# Patient Record
Sex: Male | Born: 1940 | Race: White | Hispanic: No | Marital: Married | Smoking: Never smoker
Health system: Southern US, Community
[De-identification: ages and names within clinical notes are randomized; demographics above are authoritative.]

## PROBLEM LIST (undated history)

## (undated) DIAGNOSIS — E785 Hyperlipidemia, unspecified: Secondary | ICD-10-CM

## (undated) DIAGNOSIS — N183 Chronic kidney disease, stage 3 unspecified: Secondary | ICD-10-CM

## (undated) DIAGNOSIS — L719 Rosacea, unspecified: Secondary | ICD-10-CM

## (undated) DIAGNOSIS — M1990 Unspecified osteoarthritis, unspecified site: Secondary | ICD-10-CM

## (undated) DIAGNOSIS — I10 Essential (primary) hypertension: Secondary | ICD-10-CM

## (undated) DIAGNOSIS — M5430 Sciatica, unspecified side: Secondary | ICD-10-CM

## (undated) DIAGNOSIS — Z87442 Personal history of urinary calculi: Secondary | ICD-10-CM

## (undated) DIAGNOSIS — I2510 Atherosclerotic heart disease of native coronary artery without angina pectoris: Secondary | ICD-10-CM

## (undated) DIAGNOSIS — R569 Unspecified convulsions: Secondary | ICD-10-CM

## (undated) DIAGNOSIS — R519 Headache, unspecified: Secondary | ICD-10-CM

## (undated) HISTORY — PX: CYSTOSCOPY: SHX5120

## (undated) HISTORY — DX: Hyperlipidemia, unspecified: E78.5

## (undated) HISTORY — PX: OTHER SURGICAL HISTORY: SHX169

## (undated) HISTORY — PX: MULTIPLE TOOTH EXTRACTIONS: SHX2053

## (undated) HISTORY — DX: Essential (primary) hypertension: I10

## (undated) HISTORY — DX: Unspecified convulsions: R56.9

## (undated) HISTORY — DX: Rosacea, unspecified: L71.9

---
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
## 1974-06-13 HISTORY — PX: RIB FRACTURE SURGERY: SHX2358

## 1998-12-23 ENCOUNTER — Ambulatory Visit (HOSPITAL_COMMUNITY): Admission: RE | Admit: 1998-12-23 | Discharge: 1998-12-23 | Payer: Self-pay | Admitting: Internal Medicine

## 1998-12-23 ENCOUNTER — Encounter: Payer: Self-pay | Admitting: Internal Medicine

## 1999-04-15 ENCOUNTER — Emergency Department (HOSPITAL_COMMUNITY): Admission: EM | Admit: 1999-04-15 | Discharge: 1999-04-15 | Payer: Self-pay | Admitting: Emergency Medicine

## 1999-11-18 ENCOUNTER — Encounter: Payer: Self-pay | Admitting: Emergency Medicine

## 1999-11-18 ENCOUNTER — Emergency Department (HOSPITAL_COMMUNITY): Admission: EM | Admit: 1999-11-18 | Discharge: 1999-11-19 | Payer: Self-pay | Admitting: Emergency Medicine

## 2000-03-18 ENCOUNTER — Encounter: Payer: Self-pay | Admitting: Emergency Medicine

## 2000-03-18 ENCOUNTER — Emergency Department (HOSPITAL_COMMUNITY): Admission: EM | Admit: 2000-03-18 | Discharge: 2000-03-18 | Payer: Self-pay | Admitting: Emergency Medicine

## 2002-01-18 ENCOUNTER — Emergency Department (HOSPITAL_COMMUNITY): Admission: EM | Admit: 2002-01-18 | Discharge: 2002-01-18 | Payer: Self-pay | Admitting: Emergency Medicine

## 2002-12-01 ENCOUNTER — Emergency Department (HOSPITAL_COMMUNITY): Admission: EM | Admit: 2002-12-01 | Discharge: 2002-12-01 | Payer: Self-pay | Admitting: Emergency Medicine

## 2002-12-01 ENCOUNTER — Encounter: Payer: Self-pay | Admitting: Emergency Medicine

## 2006-08-03 ENCOUNTER — Ambulatory Visit: Payer: Self-pay | Admitting: Internal Medicine

## 2006-09-21 ENCOUNTER — Ambulatory Visit: Payer: Self-pay | Admitting: Internal Medicine

## 2006-09-21 LAB — CONVERTED CEMR LAB
ALT: 49 units/L — ABNORMAL HIGH (ref 0–40)
AST: 33 units/L (ref 0–37)
BUN: 26 mg/dL — ABNORMAL HIGH (ref 6–23)
CO2: 34 meq/L — ABNORMAL HIGH (ref 19–32)
Calcium: 9.5 mg/dL (ref 8.4–10.5)
Chloride: 102 meq/L (ref 96–112)
Cholesterol: 179 mg/dL (ref 0–200)
Creatinine, Ser: 1.2 mg/dL (ref 0.4–1.5)
GFR calc Af Amer: 79 mL/min
GFR calc non Af Amer: 65 mL/min
Glucose, Bld: 171 mg/dL — ABNORMAL HIGH (ref 70–99)
HDL: 45 mg/dL (ref >39.0)
Hgb A1c MFr Bld: 7.1 % — ABNORMAL HIGH (ref 4.6–6.0)
LDL Cholesterol: 107 mg/dL — ABNORMAL HIGH (ref 0–99)
Potassium: 4 meq/L (ref 3.5–5.1)
Sodium: 143 meq/L (ref 135–145)
Total CHOL/HDL Ratio: 4
Triglycerides: 135 mg/dL (ref 0–149)
VLDL: 27 mg/dL (ref 0–40)

## 2006-09-28 ENCOUNTER — Ambulatory Visit: Payer: Self-pay | Admitting: Internal Medicine

## 2008-02-22 ENCOUNTER — Telehealth: Payer: Self-pay | Admitting: Internal Medicine

## 2008-06-20 ENCOUNTER — Ambulatory Visit: Payer: Self-pay | Admitting: Internal Medicine

## 2008-06-20 DIAGNOSIS — I10 Essential (primary) hypertension: Secondary | ICD-10-CM | POA: Insufficient documentation

## 2008-07-16 ENCOUNTER — Ambulatory Visit: Payer: Self-pay | Admitting: Gastroenterology

## 2008-07-16 ENCOUNTER — Telehealth: Payer: Self-pay | Admitting: Gastroenterology

## 2008-07-16 DIAGNOSIS — K625 Hemorrhage of anus and rectum: Secondary | ICD-10-CM | POA: Insufficient documentation

## 2008-07-16 DIAGNOSIS — R1310 Dysphagia, unspecified: Secondary | ICD-10-CM | POA: Insufficient documentation

## 2008-07-16 DIAGNOSIS — R197 Diarrhea, unspecified: Secondary | ICD-10-CM | POA: Insufficient documentation

## 2008-07-16 DIAGNOSIS — R1013 Epigastric pain: Secondary | ICD-10-CM | POA: Insufficient documentation

## 2008-07-25 ENCOUNTER — Encounter: Payer: Self-pay | Admitting: Internal Medicine

## 2008-07-25 ENCOUNTER — Ambulatory Visit: Payer: Self-pay | Admitting: Gastroenterology

## 2008-07-25 ENCOUNTER — Encounter: Payer: Self-pay | Admitting: Gastroenterology

## 2008-07-28 ENCOUNTER — Telehealth: Payer: Self-pay | Admitting: Gastroenterology

## 2008-07-29 ENCOUNTER — Telehealth: Payer: Self-pay | Admitting: Gastroenterology

## 2008-07-29 ENCOUNTER — Encounter: Payer: Self-pay | Admitting: Gastroenterology

## 2008-07-31 ENCOUNTER — Ambulatory Visit: Payer: Self-pay | Admitting: Gastroenterology

## 2008-07-31 LAB — CONVERTED CEMR LAB
ALT: 46 units/L (ref 0–53)
AST: 39 units/L — ABNORMAL HIGH (ref 0–37)
Albumin: 4 g/dL (ref 3.5–5.2)
Alkaline Phosphatase: 79 units/L (ref 39–117)
BUN: 31 mg/dL — ABNORMAL HIGH (ref 6–23)
Basophils Absolute: 0 10*3/uL (ref 0.0–0.1)
Basophils Relative: 0.3 % (ref 0.0–3.0)
CO2: 29 meq/L (ref 19–32)
Calcium: 9.2 mg/dL (ref 8.4–10.5)
Chloride: 105 meq/L (ref 96–112)
Creatinine, Ser: 1.3 mg/dL (ref 0.4–1.5)
Eosinophils Absolute: 0.1 10*3/uL (ref 0.0–0.7)
Eosinophils Relative: 1.5 % (ref 0.0–5.0)
GFR calc Af Amer: 71 mL/min
GFR calc non Af Amer: 59 mL/min
Glucose, Bld: 197 mg/dL — ABNORMAL HIGH (ref 70–99)
HCT: 40.8 % (ref 39.0–52.0)
Hemoglobin: 14.7 g/dL (ref 13.0–17.0)
Lymphocytes Relative: 37.4 % (ref 12.0–46.0)
MCHC: 36 g/dL (ref 30.0–36.0)
MCV: 91.5 fL (ref 78.0–100.0)
Monocytes Absolute: 0.4 10*3/uL (ref 0.1–1.0)
Monocytes Relative: 8 % (ref 3.0–12.0)
Neutro Abs: 3 10*3/uL (ref 1.4–7.7)
Neutrophils Relative %: 52.8 % (ref 43.0–77.0)
Platelets: 140 10*3/uL — ABNORMAL LOW (ref 150–400)
Potassium: 4.3 meq/L (ref 3.5–5.1)
RBC: 4.46 M/uL (ref 4.22–5.81)
RDW: 12.1 % (ref 11.5–14.6)
Sodium: 142 meq/L (ref 135–145)
Total Bilirubin: 1.1 mg/dL (ref 0.3–1.2)
Total Protein: 6.9 g/dL (ref 6.0–8.3)
WBC: 5.6 10*3/uL (ref 4.5–10.5)

## 2008-08-01 ENCOUNTER — Ambulatory Visit: Payer: Self-pay | Admitting: Gastroenterology

## 2008-08-01 ENCOUNTER — Encounter: Payer: Self-pay | Admitting: Gastroenterology

## 2008-08-04 ENCOUNTER — Encounter: Payer: Self-pay | Admitting: Gastroenterology

## 2008-08-22 ENCOUNTER — Ambulatory Visit: Payer: Self-pay | Admitting: Internal Medicine

## 2008-08-22 DIAGNOSIS — E1169 Type 2 diabetes mellitus with other specified complication: Secondary | ICD-10-CM | POA: Insufficient documentation

## 2008-08-22 DIAGNOSIS — E118 Type 2 diabetes mellitus with unspecified complications: Secondary | ICD-10-CM | POA: Insufficient documentation

## 2008-08-22 DIAGNOSIS — E119 Type 2 diabetes mellitus without complications: Secondary | ICD-10-CM | POA: Insufficient documentation

## 2008-08-22 DIAGNOSIS — E785 Hyperlipidemia, unspecified: Secondary | ICD-10-CM | POA: Insufficient documentation

## 2008-08-22 LAB — CONVERTED CEMR LAB
BUN: 29 mg/dL — ABNORMAL HIGH (ref 6–23)
Basophils Absolute: 0 10*3/uL (ref 0.0–0.1)
Basophils Relative: 0.1 % (ref 0.0–3.0)
CO2: 32 meq/L (ref 19–32)
Calcium: 9.8 mg/dL (ref 8.4–10.5)
Chloride: 102 meq/L (ref 96–112)
Cholesterol: 207 mg/dL (ref 0–200)
Creatinine, Ser: 1.3 mg/dL (ref 0.4–1.5)
Direct LDL: 126 mg/dL
Eosinophils Absolute: 0.1 10*3/uL (ref 0.0–0.7)
Eosinophils Relative: 1.5 % (ref 0.0–5.0)
GFR calc Af Amer: 71 mL/min
GFR calc non Af Amer: 59 mL/min
Glucose, Bld: 79 mg/dL (ref 70–99)
HCT: 44.6 % (ref 39.0–52.0)
HDL: 51.2 mg/dL (ref >39.0)
Hemoglobin: 15.5 g/dL (ref 13.0–17.0)
Hgb A1c MFr Bld: 7.2 % — ABNORMAL HIGH (ref 4.6–6.0)
Lymphocytes Relative: 35.9 % (ref 12.0–46.0)
MCHC: 34.8 g/dL (ref 30.0–36.0)
MCV: 91.7 fL (ref 78.0–100.0)
Monocytes Absolute: 0.6 10*3/uL (ref 0.1–1.0)
Monocytes Relative: 8.1 % (ref 3.0–12.0)
Neutro Abs: 4 10*3/uL (ref 1.4–7.7)
Neutrophils Relative %: 54.4 % (ref 43.0–77.0)
PSA: 1.14 ng/mL (ref 0.10–4.00)
Platelets: 147 10*3/uL — ABNORMAL LOW (ref 150–400)
Potassium: 4.3 meq/L (ref 3.5–5.1)
RBC: 4.86 M/uL (ref 4.22–5.81)
RDW: 11.8 % (ref 11.5–14.6)
Sodium: 144 meq/L (ref 135–145)
Total CHOL/HDL Ratio: 4
Triglycerides: 227 mg/dL (ref 0–149)
VLDL: 45 mg/dL — ABNORMAL HIGH (ref 0–40)
WBC: 7.3 10*3/uL (ref 4.5–10.5)

## 2008-08-23 ENCOUNTER — Encounter: Payer: Self-pay | Admitting: Internal Medicine

## 2008-08-25 ENCOUNTER — Telehealth: Payer: Self-pay | Admitting: Gastroenterology

## 2009-09-08 ENCOUNTER — Telehealth (INDEPENDENT_AMBULATORY_CARE_PROVIDER_SITE_OTHER): Payer: Self-pay | Admitting: *Deleted

## 2010-07-13 NOTE — Progress Notes (Signed)
Summary: Call back for Labs  Phone Note Outgoing Call Call back at [facility] Phone [phone]   Call placed by: Merri Ray CMA,  July 16, 2008 3:53 PM Summary of Call: called pt to inform him to come back to our lab for labwork. The orders are in IDX  left message for pt to call back if he had any questions Initial call taken by: Merri Ray CMA,  July 16, 2008 3:53 PM

## 2010-07-13 NOTE — Miscellaneous (Signed)
  Clinical Lists Changes  Medications: Added new medication of NEXIUM 40 MG  CPDR (ESOMEPRAZOLE MAGNESIUM) 1 capsule each day 30 minutes before meal - Signed Rx of NEXIUM 40 MG  CPDR (ESOMEPRAZOLE MAGNESIUM) 1 capsule each day 30 minutes before meal;  #30 x 3;  Signed;  Entered by: Louis Meckel MD;  Authorized by: Louis Meckel MD;  Method used: [id number] [address] [place], [place], [place]  [address], Ph: [phone] or [phone], Fax: [phone]    Prescriptions: NEXIUM 40 MG  CPDR (ESOMEPRAZOLE MAGNESIUM) 1 capsule each day 30 minutes before meal  #30 x 3   Entered and Authorized by:   Louis Meckel MD   Signed by:   Louis Meckel MD on 07/25/2008   Method used:   [facility]        [facility]  [address] [phone][facility] (retail)       [address]       [place], [place]  [address]       Ph: [phone] or [phone]       Fax: [phone]   RxID:   [id number]

## 2010-07-13 NOTE — Progress Notes (Signed)
Summary: RX  Phone Note Call from Patient Call back at [phone]   Summary of Call: Patient is requesting rx for "male enhancement things". He threw away the bottle and does not have info about rx. Pt last seen over a year ago. He is diabetic and has htn, which he says is why he says he needs the med. When is pt due for f/u office visit? 08/23/3008 you advised f/u labs in 6 mths. Ok to fill med? Please give details, thanks Initial call taken by: Lamar Sprinkles, CMA,  September 08, 2009 12:10 PM  Follow-up for Phone Call        needs lab: A1C 250.02, lipid panel 250.02. metabolic 401.9, hepatic 995.20,  needs ov no record of meds for erectile dysfunction. May have a trial of viagra 50mg  as needed, #6, refill as needed.  Follow-up by: Jacques Navy MD,  September 08, 2009 12:53 PM  Additional Follow-up for Phone Call Additional follow up Details #1::        left mess to call office back..........................Marland KitchenLamar Sprinkles, CMA  September 08, 2009 3:39 PM     Additional Follow-up for Phone Call Additional follow up Details #2::    Pt informed, please put lab order in IDX. THANK YOU Follow-up by: Lamar Sprinkles, CMA,  September 08, 2009 5:35 PM  Additional Follow-up for Phone Call Additional follow up Details #3:: Details for Additional Follow-up Action Taken: Orders in IDX  Additional Follow-up by: Verdell Face,  September 11, 2009 3:25 PM  New/Updated Medications: VIAGRA 50 MG TABS (SILDENAFIL CITRATE) 1 as needed Prescriptions: VIAGRA 50 MG TABS (SILDENAFIL CITRATE) 1 as needed  #6 x 12   Entered by:   Lamar Sprinkles, CMA   Authorized by:   Jacques Navy MD   Signed by:   Lamar Sprinkles, CMA on 09/08/2009   Method used:   [facility]        [facility]  [address] [phone][facility] (retail)       [address]       [place], [place]  [address]       Ph: [phone]       Fax: [phone]   RxID:   [id number]

## 2010-07-13 NOTE — Progress Notes (Signed)
Summary: Refill  Phone Note Refill Request Message from:  Fax from Pharmacy on February 22, 2008 2:01 PM  Refills Requested: Medication #1:  GLIMEPIRIDE 4 MG  TABS Take 1/2 tablet by mouth once a day Summary of Call: Received fax request for "Need Sig Correction" "Pt states he takes 1/2 bid is that correct?" Initial call taken by: Zackery Barefoot CMA,  February 22, 2008 2:03 PM  Follow-up for Phone Call        Rx sent electronic Follow-up by: Zackery Barefoot CMA,  February 22, 2008 2:08 PM    New/Updated Medications: GLIMEPIRIDE 4 MG  TABS (GLIMEPIRIDE) Take 1/2 tablet by mouth two times a day   Prescriptions: GLIMEPIRIDE 4 MG  TABS (GLIMEPIRIDE) Take 1/2 tablet by mouth two times a day  #60 x 3   Entered by:   Zackery Barefoot CMA   Authorized by:   Jacques Navy MD   Signed by:   Zackery Barefoot CMA on 02/22/2008   Method used:   Electronically to        [facility]* (retail)       [address]       [place], [place]  [address]       Ph: [phone]       Fax: [phone]   RxID:   [id number]

## 2010-07-13 NOTE — Assessment & Plan Note (Signed)
Summary: CONSTANT ABD PAIN/YF   History of Present Illness Visit Type: new patient Primary GI MD: Melvia Heaps MD [facility] Primary Provider: [name] MD Chief Complaint: epigastric pain History of Present Illness:   Scott Wilkins is a pleasant 70 year old white male patient of Dr. Debby Bud here for evaluation of abdominal pain and diarrhea.  For the past 2 years he has been complaining of postprandial upper abdominal pain.  Pain is without radiation.  He may have dysphagia to solids with associated odynophagia.  He does complain of pyrosis.  Weight has been stable.  He is on no gastric irritants including nonsteroidals.  He also has been complaining of diarrhea.  Typically he has 3-4 loose bowel movements daily.  He will occasionally awaken at night to move his bowels.  At times he has had limited bright red blood per rectum.    GI Review of Systems    Reports abdominal pain, acid reflux, belching, bloating, and  heartburn.     Location of  Abdominal pain: epigastric area.    Denies chest pain, dysphagia with liquids, dysphagia with solids, loss of appetite, nausea, vomiting, vomiting blood, weight loss, and  weight gain.      Reports diarrhea, hemorrhoids, and  rectal bleeding.     Denies anal fissure, black tarry stools, change in bowel habit, constipation, diverticulosis, fecal incontinence, heme positive stool, irritable bowel syndrome, jaundice, light color stool, liver problems, and  rectal pain.   Prior Medications Reviewed Using: Medication Bottles  Updated Prior Medication List: GLIMEPIRIDE 4 MG  TABS (GLIMEPIRIDE) Take 1/2 tablet by mouth two times a day LISINOPRIL-HYDROCHLOROTHIAZIDE 10-12.5 MG TABS (LISINOPRIL-HYDROCHLOROTHIAZIDE) 1 by mouth once daily METFORMIN HCL 1000 MG  TABS (METFORMIN HCL) Take 1 tablet by mouth two times a day for diabetes  Current Allergies (reviewed today): No known allergies  Past Medical History:    Depression    Hypertension    ENLARGED PROSTATE   Past Surgical History:    head surgery at age 63     kidney stone removal   Family History:    lung cancer :  father    Family History of Breast Cancer:sister    Family History of Heart Disease: mother father  Social History:    Patient has never smoked.     Alcohol Use - no    Illicit Drug Use - no    Patient does not get regular exercise.   Risk Factors:  Tobacco use:  never Drug use:  no Alcohol use:  no Exercise:  no  Review of Systems       The patient complains of cough, fatigue, and headaches-new.         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  He clearly has GERD and could also have active peptic disease.  Recommendations #1 begin Nexium 40 mg a day #2 upper endoscopy Orders: TLB-CBC Platelet - w/Differential (85025-CBCD) TLB-CMP (Comprehensive Metabolic Pnl) (80053-COMP)   Problem # 2:  DYSPHAGIA UNSPECIFIED (ICD-787.20) Plan endoscopy with dilatation as indicated   Problem # 3:  DIARRHEA (ICD-787.91) Diarrhea but may be related to IBS.  A structurally; including colitis are other considerations.  It is noteworthy that he has had problems for over 20 years.  Recommendations #1 colonoscopy.  If negative I will obtain random biopsies to rule out microscopic colitis  Problem # 4:  HEMORRHAGE OF RECTUM AND ANUS (ICD-569.3) rule out colonic bleeding sources including polyps hemorrhoids and neoplasm plan recommendations #1 colonoscopy   Patient Instructions: 1)  Colonoscopy and Flexible Sigmoidoscopy brochure given. 2)  Conscious Sedation brochure given. 3)  Upper Endoscopy with Dilatation brochure given.  Appended Document: Orders Update    Clinical Lists Changes  Medications: Added new medication of MIRALAX   POWD (POLYETHYLENE GLYCOL 3350) As per prep  instructions. - Signed Added new medication of REGLAN 10 MG  TABS (METOCLOPRAMIDE HCL) As per prep instructions. - Signed Added new medication of DULCOLAX 5 MG  TBEC (BISACODYL) Day before procedure take 2 at 3pm and 2 at 8pm. - Signed Rx of MIRALAX   POWD (POLYETHYLENE GLYCOL 3350) As per prep  instructions.;  #255gm x 0;  Signed;  Entered by: Merri Ray CMA;  Authorized by: Louis Meckel MD;  Method used: Print then Give to Patient Rx of REGLAN 10 MG  TABS (METOCLOPRAMIDE HCL) As per prep instructions.;  #2 x 0;  Signed;  Entered by: Merri Ray CMA;  Authorized by: Louis Meckel MD;  Method used: Print then Give to Patient Rx of DULCOLAX 5 MG  TBEC (BISACODYL) Day before procedure take 2 at 3pm and 2 at 8pm.;  #4 x 0;  Signed;  Entered by: Merri Ray CMA;  Authorized by: Louis Meckel MD;  Method used: Print then Give to Patient Orders: Added new Test order of EGD SAV (EGD SAV) - Signed Added new Test order of Colonoscopy (Colon) - Signed    Prescriptions: DULCOLAX 5 MG  TBEC (BISACODYL) Day before procedure take 2 at 3pm and 2 at 8pm.  #4 x 0   Entered by:   Merri Ray CMA   Authorized by:   Louis Meckel MD   Signed by:   Merri Ray CMA on 07/16/2008   Method used:   Print then Give to Patient   RxID:   [id number] REGLAN 10 MG  TABS (METOCLOPRAMIDE HCL) As per prep instructions.  #2 x 0   Entered by:   Merri Ray CMA   Authorized by:   Louis Meckel MD   Signed by:   Merri Ray CMA on 07/16/2008   Method used:   Print then Give to Patient   RxID:   [id number] MIRALAX   POWD (POLYETHYLENE GLYCOL 3350) As per prep  instructions.  #255gm x 0   Entered by:   Merri Ray CMA   Authorized by:   Louis Meckel MD  Signed by:   Merri Ray CMA on 07/16/2008   Method used:   Print then Give to Patient   RxID:   [id number]    Appended Document: CONSTANT ABD PAIN/YF    Clinical Lists Changes  Medications: Added new medication of MIRALAX   POWD (POLYETHYLENE GLYCOL 3350) As per prep  instructions. - Signed Added new medication of REGLAN 10 MG  TABS (METOCLOPRAMIDE HCL) As per prep instructions. - Signed Added new medication of DULCOLAX 5 MG  TBEC (BISACODYL) Day before procedure take 2 at 3pm and 2 at 8pm. - Signed Rx of MIRALAX   POWD (POLYETHYLENE GLYCOL 3350) As per prep  instructions.;  #255gm x 0;  Signed;  Entered by: Merri Ray CMA;  Authorized by: Louis Meckel MD;  Method used: [facility] to [id number] [address], [place], [place], [place]  [address], Ph: [phone] or [phone], Fax: [phone] Rx of REGLAN 10 MG  TABS (METOCLOPRAMIDE HCL) As per prep instructions.;  #2 x 0;  Signed;  Entered by: Merri Ray CMA;  Authorized by: Louis Meckel MD;  Method used: Electronically to [id number]*, [address] [place], [place], [place]  [address], Ph: [phone] or [phone], Fax: [phone] Rx of DULCOLAX 5 MG  TBEC (BISACODYL) Day before procedure take 2 at 3pm and 2 at 8pm.;  #4 x 0;  Signed;  Entered by: Merri Ray CMA;  Authorized by: Louis Meckel MD;  Method used: Electronically to [id number]*, [address] [place], [place], [place]  [address], Ph: [phone] or [phone], Fax: [phone]    Prescriptions: DULCOLAX 5 MG  TBEC (BISACODYL) Day before procedure take 2 at 3pm and 2 at 8pm.  #4 x 0   Entered by:   Merri Ray CMA   Authorized by:   Louis Meckel MD   Signed by:   Merri Ray CMA on 07/16/2008   Method used:   [facility]ron[facility] to        [facility]  [address] [phone]* (retail)       [address]       [place], [place]  [address]       Ph: [phone] or [phone]       Fax: [phone]   RxID:   [id number] REGLAN 10 MG  TABS (METOCLOPRAMIDE HCL) As per prep instructions.  #2 x 0   Entered by:   Merri Ray CMA   Authorized by:   Louis Meckel MD   Signed by:   Merri Ray CMA on 07/16/2008   Method used:   Electronically to        [facility]  [address] [phone]* (retail)       [address]       [place], [place]  [address]       Ph: [phone] or [phone]       Fax: [phone]   RxID:   [id number] MIRALAX   POWD (POLYETHYLENE GLYCOL 3350) As per prep  instructions.  #255gm x 0   Entered by:   Merri Ray CMA   Authorized by:   Louis Meckel MD   Signed by:   Merri Ray CMA on 07/16/2008   Method used:   [facility]ronically to        [facility]  [address] [phone]* (  retail)       [address]       [place], [place]  [address]       Ph: [phone] or [phone]       Fax: [phone]   RxID:   [id number]

## 2010-07-13 NOTE — Letter (Signed)
[facility] [address] [place], [place]  [address] Phone: [phone]      August 23, 2008   [place] [address] [place], [place] [address]  RE:  LAB RESULTS  Dear  Scott Wilkins,  The following is an interpretation of your most recent lab tests.  Please take note of any instructions provided or changes to medications that have resulted from your lab work.  ELECTROLYTES:  Good - no changes needed  KIDNEY FUNCTION TESTS:  Good - no changes needed  LIVER FUNCTION TESTS:  Good - no changes needed  Health professionals look at cholesterol as more involved than just the total cholesterol. We consider the level of LDL (bad) cholesterol, HDL (good), cholesterol, and Triglycerides (Grease) in the blood.  1. Your LDL should be under 100, and the HDL should be over 45, if you have any vascular disease such as heart attack, angina, stroke, TIA (mini stroke), claudication (pain in the legs when you walk due to poor circulation),  Abdominal Aortic Aneurysm (AAA), diabetes or prediabetes.  2. Your LDL should be under 130 if you have any two of the following:     a. Smoke or chew tobacco,     b. High blood pressure (if you are on medication or over 140/90 without medication),     c. Male gender,    d. HDL below 40,    e. A male relative (father, brother, or son), who have had any vascular event          as described in #1. above under the age of 32, or a male relative (mother,       sister, or daughter) who had an event as described above under age 23. (An HDL over 60 will subtract one risk factor from the total, so if you have two items in # 2 above, but an HDL over 60, you then fall into category # 3 below).  3. Your LDL should be under 160 if you have any one of the above.  Triglycerides should be under 200 with the ideal being under 150.  For diabetes or pre-diabetes, the ideal HgbA1C should be under 6.0%.  If you fall into any of the above  categories, you should make a follow up appointment to discuss this with your physician.  LIPID PANEL:  Fair - review at your next visit Triglyceride: 227   Cholesterol: 207   LDL: DEL   HDL: 51.2   Chol/HDL%:  4.0 CALC  DIABETIC STUDIES:  Improved - continue management Blood Glucose: 79   HgbA1C: 7.2     CBC:  Good - no changes needed  LDL 126 is above goal for a diabetic  of 100 or less.   Lab results look pretty good: A1C is very close to goal. The LDL is too high and because of the many benefits for diabetics including lowering the LDL I recommend you start lovastatin once a day. A Rx has been sent to your pharmacy. You will need follow-up lab in 6 weeks.  Call or e-mail me if you have questions ([email or web address]).   Medications Prescribed or Changed LOVASTATIN 20 MG TABS (LOVASTATIN) 1 by mouth qPM   Sincerely Yours,    Jacques Navy MD Patient: Scott Wilkins Note: All result statuses are Final unless otherwise noted.  Tests: (1) LIPID PROFILE (LIPID)   CHOLESTEROL          [HH] 207 mg/dL  0-200     REFLEX TESTING FOR LDLD       ATP III Classification:             < 200       mg/dL      Desireable            200 - 239    mg/dL      Borderline High            > = 240      mg/dL      High   TRIGLYCERIDES        [HH] 227 mg/dL                   1-610        Normal:  < 150 mg/dL        Borderline High:  150 - 199 mg/dL   HDL                       96.0 mg/dL                  >45.4   VLDL CHOLESTEROL     [H]  45 mg/dl                    0-98   LDL CHOLESTEROL           DEL (D)  CHOL/HDL Ratio: CHD Risk                             4.0 CALC  Tests: (2) CBC WITH DIFF (CBCD)   WHITE CELL COUNT          7.3 K/uL                    4.5-10.5   RED CELL COUNT            4.86 Mil/uL                 4.22-5.81   HEMOGLOBIN                15.5 g/dL                   11.9-14.7   HEMATOCRIT                44.6 %                      39.0-52.0   MCV                        91.7 fl                     78.0-100.0   MCHC                      34.8 g/dL                   82.9-56.2   RDW                       11.8 %                      11.5-14.6   PLATELET COUNT       [L]  147 K/uL  150-400   NEUTROPHIL %              54.4 %                      43.0-77.0   LYMPHOCYTE %              35.9 %                      12.0-46.0   MONOCYTE %                8.1 %                       3.0-12.0   EOSINOPHILS %             1.5 %                       0.0-5.0   BASOPHILS %               0.1 %                       0.0-3.0  NEUTROPHILS, ABSOLUTE                             4.0 K/uL                    1.4-7.7   MONOCYTES, ABSOLUTE       0.6 K/uL                    0.1-1.0  EOSINOPHILS, ABSOLUTE                             0.1 K/uL                    0.0-0.7   BASOPHILS, ABSOLUTE       0.0 K/uL                    0.0-0.1  Tests: (3) BASIC METABOLIC PANEL (METABOL)   SODIUM                    144 mEq/L                   135-145   POTASSIUM                 4.3 mEq/L                   3.5-5.1   CHLORIDE                  102 mEq/L                   96-112   CARBON DIOXIDE            32 mEq/L                    19-32   GLUCOSE                   79 mg/dL                    27-03   BUN                  [  H]  29 mg/dL                    4-03   CREATININE                1.3 mg/dL                   4.7-4.2   CALCIUM                   9.8 mg/dL                   5.9-56.3  GFR (AFRICAN AMERICAN)                             71 mL/min  GFR (NON-AFRICAN AMERICAN)                             59 mL/min  Tests: (4) HGB A1C (A1C)   A1C%                 [H]  7.2 %                       4.6-6.0     The ADA recommends the following therapeutic goals for glycemic     control related to Hgb A1C measurement.          Goal of Therapy   < 7.0% Hgb A1C     Action Suggested  > 8.0% Hgb A1C          Ref: Diabetes Care, 22, Suppl. 1, 1999  Tests: (5)  CHOLESTEROL, LDL-DIRECT (DIRLDL)  CHOLESTEROL, LDL-DIRECT                             126.0 mg/dL       ATP III Classification:             < 100      mg/dL     Optimal             100 - 129  mg/dL     Near or above optimal             130 - 159  mg/dL     Borderline High             160 - 189  mg/dL     High              > 190     mg/dL     Very High  Tests: (6) PSA_Hyb (PSA)   PSA_Hyb                   1.14 ng/mL                  0.10-4.00

## 2010-07-13 NOTE — Procedures (Signed)
Summary: EGD   EGD  Procedure date:  07/25/2008  Findings:      Location: [facility]    ENDOSCOPY PROCEDURE REPORT  PATIENT:  Scott Wilkins, Scott Wilkins  MR#:  [id number] BIRTHDATE:   Jun 24, 1940   GENDER:   male  ENDOSCOPIST:   Scott Hair. Arlyce Dice, MD ASSISTANT:    PROCEDURE DATE:  07/25/2008 PROCEDURE:  EGD with biopsy, Elease Hashimoto Dilation of the Esophagus ASA CLASS:   Class II INDICATIONS: 1) dysphagia  2) dyspepsia   MEDICATIONS:    Fentanyl 50 mcg, Versed 6 mg, glycopyrrolate (Robinal) 0.2, 0.6cc simethancone 0.6 cc PO TOPICAL ANESTHETIC:   Exactacain Spray  DESCRIPTION OF PROCEDURE:   After the risks benefits and alternatives of the procedure were thoroughly explained, informed consent was obtained.  [facility] [id number] endoscope was introduced through the mouth and advanced to the third portion of the duodenum, without limitations.  The instrument was slowly withdrawn as the mucosa was carefully examined. <<PROCEDUREIMAGES>>  <<OLD IMAGES>>  other findings (see image3). Prominent papilla  A stricture was found at the gastroesophageal junction (see image4).  The examination was otherwise normal.    Dilation was then performed at the gastroesphageal junction  1) Dilator:   Maloney   Size(s):   18 Resistance:   minimal   Heme:   none Appearance:     COMPLICATIONS:   None  ENDOSCOPIC IMPRESSION:  1) Other findings - prominent papilla  2) Stricture at the gastroesophageal junction  3) Otherwise normal examination. RECOMMENDATIONS:  1) continue PPI  2) follow-up: office 4 week(s)  REPEAT EXAM:   No  cc Dr. [name]   _______________________________ Scott Hair. Arlyce Dice, MD         [facility] [address] [address] [place], [place] [address] Telephone [phone] or [phone] Fax [phone]   REPORT OF SURGICAL PATHOLOGY   Case #: [id number] Patient Name: Scott Wilkins. Office Chart Number:  N/A   MRN: [id number]  Pathologist: Ferd Hibbs. Colonel Bald, MD DOB/Age  [birth year] (Age: 70)    Gender: M Date Taken:  07/25/2008 Date Received: 07/25/2008   FINAL DIAGNOSIS   ***MICROSCOPIC EXAMINATION AND DIAGNOSIS***   DUODENUM, BIOPSY:   - CHRONIC DUODENITIS CONSISTENT WITH PEPTIC DUODENITIS.   - NO VILLOUS ATROPHY OR MALIGNANCY IDENTIFIED.   mj Date Reported:  07/28/2008     Ivin Booty B. Colonel Bald, MD *** Electronically Signed Out By JBK ***   Clinical information Epigastric pain, rule out dysplasia (mj)   specimen(s) obtained Duodenum, biopsy   Gross Description Received in formalin are tan, soft tissue fragments that are submitted in toto.  Number:  Two            Size:  Both 0.3 cm (TB:mj 07/25/08)   mj/     Signed by Scott Meckel MD on 07/29/2008 at 9:04 AM   July 29, 2008 MRN: [id number]    [name] [address] [place], [place]  [address]    Dear [name],  I am pleased to inform you that the biopsies taken during your recent endoscopic examination did not show any evidence of cancer upon pathologic examination.  Additional information/recommendations:  __No further action is needed at this time.  Please follow-up with      your primary care physician for your other healthcare needs.  __ Please call [phone] to schedule a return visit to review      your condition.  _x_ Continue with the treatment plan as outlined on  the day of your      exam.  __ You should have a repeat endoscopic examination for this problem              in _ months/years.   Please call us if you are having persistent problems or have questions about your condition that have not been fully answered at this time.  Sincerely,  Scott Meckel MD  This letter has been electronically signed by your physician.   S igned by Scott Meckel MD on 07/29/2008 at 9:05 AM  This report was created from the original endoscopy report, which was reviewed and signed by the above listed endoscopist.

## 2010-07-13 NOTE — Letter (Signed)
Summary: Patient [facility] Biopsy Results  [facility]  [address] [place], [place] [address]   Phone: [phone]  Fax: [phone]        July 29, 2008 MRN: [id number]    Scott Wilkins [address] [place], [place]  [address]    Dear Scott Wilkins,  I am pleased to inform you that the biopsies taken during your recent endoscopic examination did not show any evidence of cancer upon pathologic examination.  Additional information/recommendations:  __No further action is needed at this time.  Please follow-up with      your primary care physician for your other healthcare needs.  __ Please call [phone] to schedule a return visit to review      your condition.  _x_ Continue with the treatment plan as outlined on the day of your      exam.  __ You should have a repeat endoscopic examination for this problem              in _ months/years.   Please call us if you are having persistent problems or have questions about your condition that have not been fully answered at this time.  Sincerely,  Louis Meckel MD  This letter has been electronically signed by your physician.

## 2010-07-13 NOTE — Letter (Signed)
Summary: Patient Notice-Hyperplastic Polyps  [facility]  [address] [place], [place] [address]   Phone: [phone]  Fax: [phone]        August 04, 2008 MRN: [id number]    [place] [address] [place], [place]  [address]    Dear Scott Wilkins,  I am pleased to inform you that the colon polyp(s) removed during your recent colonoscopy was (were) found to be hyperplastic.  These types of polyps are NOT pre-cancerous.  It is therefore my recommendation that you have a repeat colonoscopy examination in 10_ years for routine colorectal cancer screening.  Should you develop new or worsening symptoms of abdominal pain, bowel habit changes or bleeding from the rectum or bowels, please schedule an evaluation with either your primary care physician or with me.  Additional information/recommendations:  __No further action with gastroenterology is needed at this time.      Please follow-up with your primary care physician for your other      healthcare needs. __Please call [phone] to schedule a return visit to review      your situation.  __Please keep your follow-up visit as already scheduled.  _x_Continue treatment plan as outlined the day of your exam.  Please call us if you are having persistent problems or have questions about your condition that have not been fully answered at this time.  Sincerely,  Scott Meckel MD This letter has been electronically signed by your physician.

## 2010-07-13 NOTE — Procedures (Signed)
Summary: Colonoscopy   Colonoscopy  Procedure date:  08/01/2008  Findings:      Location:  [facility].    Procedures Next Due Date:    Colonoscopy: 07/2018  COLONOSCOPY PROCEDURE REPORT  PATIENT:  Scott Wilkins, Scott Wilkins  MR#:  [id number] BIRTHDATE:   06-Mar-1941   GENDER:   male  ENDOSCOPIST:   Barbette Hair. Arlyce Dice, MD Referred by: Rosalyn Gess. Norins, M.D.  PROCEDURE DATE:  08/01/2008 PROCEDURE:  Colonoscopy with biopsy, Colonoscopy with snare polypectomy ASA CLASS:   Class II INDICATIONS: unexplained diarrhea   MEDICATIONS:    Fentanyl 50 mcg, Versed 7 mg  DESCRIPTION OF PROCEDURE:   After the risks benefits and alternatives of the procedure were thoroughly explained, informed consent was obtained.  Digital rectal exam was performed and revealed no abnormalities.   The LB [id number] endoscope was introduced through the anus and advanced to the cecum, which was identified by both the appendix and ileocecal valve, without limitations.  The quality of the prep was adequate, using MiraLax.  The instrument was then slowly withdrawn as the colon was fully examined. <<PROCEDUREIMAGES>>                  <<OLD IMAGES>>  FINDINGS:  A sessile polyp was found. It was 1 cm in size. Polyp was snared, then cauterized with monopolar cautery. Retrieval was successful (see image2). snare polyp  This was otherwise a normal examination (see image4, image5, image6, image7, image8, image9, image10, image11, and image12). Random biopsies were taken to r/o microscopic colitis   Retroflexed views in the rectum revealed no abnormalities.    The scope was then withdrawn from the patient and the procedure completed.  COMPLICATIONS:   None  ENDOSCOPIC IMPRESSION:  1) 1 cm sessile polyp  2) Otherwise normal examination RECOMMENDATIONS:  1) If the polyp(s) removed today are proven to be adenomatous (pre-cancerous) polyps, you will need a repeat colonoscopy in 5 years. Otherwise you should  continue to follow colorectal cancer screening guidelines for "routine risk" patients with colonoscopy in 10 years.  2) follow-up: office 3 week(s)  REPEAT EXAM:   In 5 year(s) for Colonoscopy.   _______________________________ Barbette Hair. Arlyce Dice, MD  CC: [name], MD        REPORT OF SURGICAL PATHOLOGY   Case #: [id number] Patient Name: Scott Wilkins, Scott Wilkins. Office Chart Number:  [id number]   MRN: [id number] Pathologist: Scott Server A. Delila Spence, MD DOB/Age  04-17-1941 (Age: 70)    Gender: M Date Taken:  08/01/2008 Date Received: 08/01/2008   FINAL DIAGNOSIS   ***MICROSCOPIC EXAMINATION AND DIAGNOSIS***   1.  ASCENDING COLON, POLYP(S):  HYPERPLASTIC POLYP(S).  NO ADENOMATOUS CHANGE OR MALIGNANCY IDENTIFIED.   2.  COLON, BIOPSY:  BENIGN COLONIC MUCOSA.  NO SIGNIFICANT INFLAMMATION OR OTHER ABNORMALITIES IDENTIFIED.    COMMENT 2.  There is colorectal mucosa with normal crypt architecture and no objective increase in inflammation.  No active inflammation, microscopic colitis, collagenous colitis or significant chronic changes identified. No hyperplastic or adenomatous changes are seen, and there is no evidence of malignancy. (EAA:mj 08/04/08)   mj Date Reported:  08/04/2008     Scott Server A. Delila Spence, MD *** Electronically Signed Out By EAA ***      August 04, 2008 MRN: [id number]    [facility] [address] [place], [place]  [address]    Dear Scott Wilkins,  I am pleased to inform you that the colon polyp(s) removed during your recent colonoscopy was (were) found to be hyperplastic.  These types of polyps are NOT pre-cancerous.  It is therefore my recommendation that you have a repeat colonoscopy examination in 10_ years for routine colorectal cancer screening.  Should you develop new or worsening symptoms of abdominal pain, bowel habit changes or bleeding from the rectum or bowels, please schedule an evaluation with either your primary care physician or with me.   Additional information/recommendations:  __No further action with gastroenterology is needed at this time.      Please follow-up with your primary care physician for your other      healthcare needs. __Please call [phone] to schedule a return visit to review      your situation.  __Please keep your follow-up visit as already scheduled.  _x_Continue treatment plan as outlined the day of your exam.  Please call us if you are having persistent problems or have questions about your condition that have not been fully answered at this time.  Sincerely,  Scott Meckel MD This letter has been electronically signed by your physician.    This report was created from the original endoscopy report, which was reviewed and signed by the above listed endoscopist.

## 2010-07-13 NOTE — Assessment & Plan Note (Signed)
Summary: FU--$50--BP MED CHANGE---STC   Vital Signs:  Patient Profile:   71 Years Old Male Height:     62 inches Weight:      176 pounds Temp:     98.3 degrees F oral Pulse rate:   76 / minute BP sitting:   140 / 90  (left arm) Cuff size:   large  Vitals Entered By: Zackery Barefoot CMA (June 20, 2008 4:30 PM)                 Chief Complaint:  Follow up on elevated BP.  History of Present Illness: Patient was last seen April '08. 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 He last pressure was 148/88 in '08, today it is 140/90 on HCTZ 25mg  daily.  Patient has had no headaches, no epistaxis, some left leg pain but no focal weakness. He is asymptomatic.    Prior Medications Reviewed Using: Medication Bottles  Prior Medication List:  GLIMEPIRIDE 4 MG  TABS (GLIMEPIRIDE) Take 1/2 tablet by mouth two times a day HYDROCHLOROTHIAZIDE 25 MG  TABS (HYDROCHLOROTHIAZIDE) Take 1 tablet by mouth once a day METFORMIN HCL 1000 MG  TABS (METFORMIN HCL) Take 1 tablet by mouth two times a day for diabetes   Current Allergies (reviewed today): No known allergies      Review of Systems  The patient denies anorexia, fever, weight loss, weight gain, chest pain, syncope, dyspnea on exertion, prolonged cough, headaches, hemoptysis, and difficulty walking.     Physical Exam  General:     WNWD white male, NAD Head:     Normocephalic and atraumatic without obvious abnormalities. No apparent alopecia or balding. Eyes:     PERRLA, Fundi without exudate, hemorrhage or papilledema Heart:     normal rate, regular rhythm, and no murmur.      Impression & Recommendations:  Problem # 1:  UNSPECIFIED ESSENTIAL HYPERTENSION (ICD-401.9) Patient with mild HTN now worse than in the past. He is asymptomatic and there are no stigmata of HTN.  Plan: change medication to lisinopril/hct.  His updated medication list for this problem includes:     Lisinopril-hydrochlorothiazide 10-12.5 Mg Tabs (Lisinopril-hydrochlorothiazide) .Marland Kitchen... 1 by mouth once daily   Complete Medication List: 1)  Glimepiride 4 Mg Tabs (Glimepiride) .... Take 1/2 tablet by mouth two times a day 2)  Lisinopril-hydrochlorothiazide 10-12.5 Mg Tabs (Lisinopril-hydrochlorothiazide) .Marland Kitchen.. 1 by mouth once daily 3)  Metformin Hcl 1000 Mg Tabs (Metformin hcl) .... Take 1 tablet by mouth two times a day for diabetes   Patient Instructions: 1)  blood pressure is not as well controlled as needed in a diabetic. Plan - change to lisinopril/hct 10/12.5 mg once a day. You will need to return in about a month for follow-up. 2)  You need a physical and labs for routine care, including diabetes management. You need to get an eye exam.   Prescriptions: LISINOPRIL-HYDROCHLOROTHIAZIDE 10-12.5 MG TABS (LISINOPRIL-HYDROCHLOROTHIAZIDE) 1 by mouth once daily  #30 x 12   Entered and Authorized by:   Jacques Navy MD   Signed by:   Jacques Navy MD on 06/20/2008   Method used:   Electronically to        [facility] [phone]* (retail)       [address]       [place], [place]  [address]       Ph: [phone]       Fax: [phone]  RxID:   [id number]

## 2010-07-13 NOTE — Assessment & Plan Note (Signed)
Summary: rs'd from bmp list 2/26 / f/u appt/cd   Vital Signs:  Patient profile:   70 year old male Height:      62 inches Weight:      171.2 pounds BMI:     31.43 Temp:     98.5 degrees F oral Pulse rate:   82 / minute BP sitting:   128 / 80  (left arm) Cuff size:   large  Vitals Entered By: Zackery Barefoot CMA (August 22, 2008 3:00 PM)  Vision Screening:Left eye w/o correction: 20 / 30 Right Eye w/o correction: 20 / 20 Both eyes w/o correction:  20/ 20        Vision Entered By: Zackery Barefoot CMA (August 22, 2008 4:25 PM)   Primary Care Provider:  [name] MD   History of Present Illness: Patient presents for blood pressure follow up: needs labs and physical exam. In the interval since his Jan '10 visit he has had colonoscopy. He is due Opthal exam.   He is generally feeling well. The lisinopril/hct has helped relieve the pressure in his chest and he is tolerating the medication well.   Current Medications (verified): 1)  Glimepiride 4 Mg  Tabs (Glimepiride) .... Take 1/2 Tablet By Mouth Two Times A Day 2)  Lisinopril-Hydrochlorothiazide 10-12.5 Mg Tabs (Lisinopril-Hydrochlorothiazide) .Marland Kitchen.. 1 By Mouth Once Daily 3)  Metformin Hcl 1000 Mg  Tabs (Metformin Hcl) .... Take 1 Tablet By Mouth Two Times A Day For Diabetes  Allergies (verified): No Known Drug Allergies  Past History  Past Medical History: Depression Hypertension ENLARGED PROSTATE (07/16/2008)  Past Surgical History: head surgery at age 50  kidney stone removal  (07/16/2008)  Family History: lung cancer :  father Family History of Breast Cancer:sister Family History of Heart Disease: mother father  (07/16/2008)  Social History: Patient has never smoked.  Alcohol Use - no Illicit Drug Use - no Patient does not get regular exercise.   (07/16/2008)  Risk Factors: Alcohol Use: N/A >5 drinks/d w/in last 3 months: N/A Caffeine Use: N/A Diet: N/A Exercise: no (07/16/2008)  Risk  Factors: Smoking Status: never (07/16/2008) Packs/Day: N/A Cigars/wk: N/A Pipe Use/wk: N/A Cans of tobacco/wk: N/A Passive Smoke Exposure: N/A  Review of Systems  The patient denies anorexia, fever, weight loss, weight gain, decreased hearing, chest pain, dyspnea on exertion, peripheral edema, prolonged cough, headaches, abdominal pain, severe indigestion/heartburn, incontinence, muscle weakness, transient blindness, difficulty walking, abnormal bleeding, enlarged lymph nodes, and angioedema.    Physical Exam  General:  Well-developed,well-nourished,in no acute distress; alert,appropriate and cooperative throughout examination Head:  normocephalic and atraumatic.   Eyes:  vision grossly intact, pupils equal, pupils round, corneas and lenses clear, and no injection.  Full exam deferred to opthalmology - which the patient says he has scheduled. Ears:  R ear normal, L ear normal, and no external deformities.   Nose:  no external deformity and no external erythema.   Mouth:  edentulous, upper denture, no oral lesions, posterior pharynx clear Neck:  No deformities, masses, or tenderness noted.no thyromegaly and no carotid bruits.   Chest Wall:  no deformities, no tenderness, and no masses.   Lungs:  normal respiratory effort, normal breath sounds, no crackles, and no wheezes.   Heart:  normal rate, regular rhythm, no murmur, and no JVD.   Abdomen:  infra-umbilical scar.soft, non-tender, normal bowel sounds, no distention, no guarding, and no hepatomegaly.   Rectal:  no external abnormalities, no hemorrhoids, and normal sphincter tone.   Prostate:  Prostate gland firm and smooth, no enlargement, nodularity, tenderness, mass, asymmetry or induration. Msk:  normal ROM, no joint tenderness, no joint swelling, no joint warmth, and no joint deformities.   Pulses:  2+ radial and DP pulses Extremities:  No clubbing, cyanosis, edema, or deformity noted with normal full range of motion of all joints.    Neurologic:  alert & oriented X3, cranial nerves II-XII intact, strength normal in all extremities, gait normal, and DTRs symmetrical and normal.   Skin:  turgor normal, color normal, no rashes, no ulcerations, and no edema.   Cervical Nodes:  no anterior cervical adenopathy and no posterior cervical adenopathy.   Inguinal Nodes:  no R inguinal adenopathy and no L inguinal adenopathy.   Psych:  Oriented X3, memory intact for recent and remote, normally interactive, good eye contact, and not anxious appearing.    Diabetes Management Exam:    Foot Exam (with socks and/or shoes not present):       Sensory-Pinprick/Light touch:          Left medial foot (L-4): normal          Left dorsal foot (L-5): normal          Left lateral foot (S-1): normal       Sensory-other: normal deep vibratory sensation - left   Impression & Recommendations:  Problem # 1:  DM (ICD-250.00) A1C 7.2% - close to goal of 7% or less.   Plan: no change in medication. Recommend better dietary management.   His updated medication list for this problem includes:    Glimepiride 4 Mg Tabs (Glimepiride) .Marland Kitchen... Take 1/2 tablet by mouth two times a day    Lisinopril-hydrochlorothiazide 10-12.5 Mg Tabs (Lisinopril-hydrochlorothiazide) .Marland Kitchen... 1 by mouth once daily    Metformin Hcl 1000 Mg Tabs (Metformin hcl) .Marland Kitchen... Take 1 tablet by mouth two times a day for diabetes  Orders: TLB-A1C / Hgb A1C (Glycohemoglobin) (83036-A1C)  Problem # 2:  UNSPECIFIED ESSENTIAL HYPERTENSION (ICD-401.9)  His updated medication list for this problem includes:    Lisinopril-hydrochlorothiazide 10-12.5 Mg Tabs (Lisinopril-hydrochlorothiazide) .Marland Kitchen... 1 by mouth once daily  Orders: TLB-BMP (Basic Metabolic Panel-BMET) (80048-METABOL)  BP today: 128/80 Prior BP: 112/60 (07/16/2008)  Good control on present medications. Metabolic panel is ok.  Plan: contnue present regimen.  Problem # 3:  OTHER AND UNSPECIFIED HYPERLIPIDEMIA (ICD-272.4)  Patient with an LDL of 126 with goal for a diabetic of 100.   Plan: patient is to follow a low fat diet.         recheck lab in 6 months and if LDL is 100+ will recommend medical therapy.   Complete Medication List: 1)  Glimepiride 4 Mg Tabs (Glimepiride) .... Take 1/2 tablet by mouth two times a day 2)  Lisinopril-hydrochlorothiazide 10-12.5 Mg Tabs (Lisinopril-hydrochlorothiazide) .Marland Kitchen.. 1 by mouth once daily 3)  Metformin Hcl 1000 Mg Tabs (Metformin hcl) .... Take 1 tablet by mouth two times a day for diabetes  Other Orders: TLB-Lipid Panel (80061-LIPID) TLB-CBC Platelet - w/Differential (85025-CBCD) TLB-PSA (Prostate Specific Antigen) (84153-PSA)

## 2010-07-13 NOTE — Progress Notes (Signed)
Summary: REV scheduled  Phone Note Outgoing Call   Call placed by: Laureen Ochs LPN,  July 29, 2008 11:16 AM Call placed to: Patient Summary of Call: Pt. scheduled for an REV with Dr.Saavi Mceachron for 08-25-08 at 3:30pm. Mesage left for patient to callback.Laureen Ochs LPN  July 29, 2008 11:17 AM  Pt. wife called back and I gave her pt. REV appointment information.Pt. instructed to call back as needed.  Initial call taken by: Laureen Ochs LPN,  July 29, 2008 1:07 PM

## 2010-07-13 NOTE — Progress Notes (Signed)
Summary: Lab reminder  Phone Note Outgoing Call Call back at [facility] Phone [phone]   Call placed by: Merri Ray CMA,  July 28, 2008 9:14 AM Summary of Call: called pt to remind him he needed to come into the lab for bloodwork this week. Orders are in EMR, left message with pts wife. She stated she wanted to know about his biopsy results explained to her that when we recieve them the [facility] will be calling them back. Initial call taken by: Merri Ray CMA,  July 28, 2008 9:17 AM

## 2010-09-28 LAB — GLUCOSE, CAPILLARY
Glucose-Capillary: 122 mg/dL — ABNORMAL HIGH (ref 70–99)
Glucose-Capillary: 137 mg/dL — ABNORMAL HIGH (ref 70–99)
Glucose-Capillary: 124 mg/dL — ABNORMAL HIGH (ref 70–99)
Glucose-Capillary: 146 mg/dL — ABNORMAL HIGH (ref 70–99)

## 2010-10-18 ENCOUNTER — Other Ambulatory Visit: Payer: Self-pay | Admitting: Internal Medicine

## 2010-10-29 NOTE — Assessment & Plan Note (Signed)
[facility]                           PRIMARY CARE OFFICE NOTE   SHELDON, SEM                      MRN:          [id number]  DATE:09/28/2006                            DOB:          Jan 11, 1941    Mr. Scott Wilkins is a 70 year old gentleman, who was last seen in the office,  August 03, 2006, for cerumen impaction.  He presents now for followup  evaluation and exam.  He voices no active complaints.   PAST MEDICAL HISTORY:  SURGICAL:  1. Craniotomy at age 89, after trauma.  2. ORIF, right tib-fib fracture, 1976.  No other surgeries reported.   MEDICAL:  1. Usual childhood diseases.  2. Hypertension.  3. Rosacea.  4. NIDDM.  5. Hyperlipidemia.  6. History of seizure disorder in the past.   CURRENT MEDICATIONS:  1. HCTZ 25 mg daily.  2. Metformin 1000 mg b.i.d.  3. Amaryl 2 mg daily.  4. Metrogel 0.75% applied p.r.n.  5. Famotidine 20 mg b.i.d.  6. Restoril 15 mg q.h.s. p.r.n.   FAMILY HISTORY:  Positive for coronary artery disease, father and  mother, two brothers.  Negative for prostate cancer, colon cancer or  diabetes.   SOCIAL HISTORY:  Patient completed the eighth grade.  He has been  married for 44 years.  He has three daughters.  He has at least five  grandchildren.  Patient works as a Hospital doctor.   REVIEW OF SYSTEMS:  Patient has had no fevers, sweats, chills or other  constitutional problems.  No ophthalmologic, ENT, cardiovascular,  respiratory or GI complaints are noted.   PHYSICAL EXAM:  Temperature was 97.9, blood pressure 148/82, pulse 83,  weight 181.  GENERAL APPEARANCE:  This is a well-nourished, well-developed gentleman,  in no acute distress.  HEENT EXAM:  Normocephalic, atraumatic.  The EACs and TMs were normal.  Oropharynx without buccal lesions.  No palatal lesions.  Posterior  pharynx was clear.  Conjunctiva and sclera was clear.  Pupils were  equal, round and reactive to light and accommodation.  Funduscopic  exam  with hand-held instrument revealed normal disc margins, no vascular  abnormalities were noted.  NECK:  Supple without thyromegaly.  NODES:  No adenopathy was noted in the cervical or supraclavicular  regions.  CHEST:  No CVA tenderness.  LUNGS:  Clear to auscultation and percussion.  CARDIOVASCULAR:  Two-plus radial pulse, no JVD or carotid bruits.  He  had a quiet precordium with regular rate and rhythm, without murmurs,  rubs or gallops.  ABDOMEN:  Soft, no guarding, no rebound, no organosplenomegaly was  noted.  GENITALIA:  Normal male, bilaterally descended testicles.  RECTAL EXAM:  Normal sphincter tone was noted.  The prostate was smooth,  round, normal size and contour, without nodules.  EXTREMITIES:  Without clubbing, cyanosis, edema, or deformity.  NEUROLOGIC EXAM:  Nonfocal.  SKIN:  Clear.   DATA BASE:  Electrolytes were normal.  Serum glucose 171 with a  hemoglobin A1c of 7.1%.  Cholesterol 179, triglycerides 135, HDL 45, LDL  was 107.  Liver functions were normal.   ASSESSMENT AND PLAN:  1.  Hypertension.  The patient's blood pressure is borderline      controlled.  This should be monitored as an outpatient.  If he      continues to have systolic blood pressures greater than 140, we      would need to adjust his medications with most likely medication to      add being ACE inhibitor.  2. Lipids:  The patient's LDL cholesterol is almost at goal of 100 or      less with diet management alone.  PLAN:  Patient is to continue to      follow a low-fat diet.  3. Diabetes:  Patient is right at control with A1c of 7.1%.  PLAN:  To      continue his present medications of Amaryl 2 mg daily and Metformin      1 g b.i.d. and to adhere to a no-concentrated-sweets, low-      carbohydrate diet.  4. Rosacea is stable.  5. Health maintenance:  Patient with a normal 12-lead      electrocardiogram, normal laboratory, otherwise normal physical      exam.  He would be a candidate  for colorectal cancer screening at      his convenience with colonoscopy.  Patient is to contact me if he      wants [place] to proceed with scheduling.   SUMMARY:  A pleasant gentleman, who seems medically stable with his  diabetes and lipids under reasonably good control.  He does need to  follow his blood pressure closely and does need to contact the office if  he wants [place] to schedule colonoscopy.     Scott Gess Norins, MD  Electronically Signed    MEN/MedQ  DD: 09/29/2006  DT: 09/29/2006  Job #: [id number]   cc:   [place], [place] [address] Darnelle Maffucci, [address]

## 2010-12-20 ENCOUNTER — Other Ambulatory Visit: Payer: Self-pay | Admitting: Internal Medicine

## 2010-12-20 NOTE — Telephone Encounter (Signed)
Rx Done . 

## 2011-03-23 ENCOUNTER — Other Ambulatory Visit: Payer: Self-pay | Admitting: *Deleted

## 2011-03-23 ENCOUNTER — Other Ambulatory Visit: Payer: Self-pay | Admitting: Internal Medicine

## 2011-03-23 MED ORDER — METFORMIN HCL 1000 MG PO TABS: 1000.0000 mg | ORAL_TABLET | Freq: Two times a day (BID) | ORAL | Status: DC

## 2011-06-27 ENCOUNTER — Other Ambulatory Visit: Payer: Self-pay

## 2011-07-28 ENCOUNTER — Other Ambulatory Visit (INDEPENDENT_AMBULATORY_CARE_PROVIDER_SITE_OTHER): Payer: Medicare Other

## 2011-07-28 ENCOUNTER — Ambulatory Visit (INDEPENDENT_AMBULATORY_CARE_PROVIDER_SITE_OTHER): Payer: Medicare Other | Admitting: Internal Medicine

## 2011-07-28 ENCOUNTER — Encounter: Payer: Self-pay | Admitting: Internal Medicine

## 2011-07-28 VITALS — BP 128/68 | HR 69 | Temp 98.5°F | Resp 16 | Wt 169.8 lb

## 2011-07-28 DIAGNOSIS — Z0000 Encounter for general adult medical examination without abnormal findings: Secondary | ICD-10-CM | POA: Diagnosis not present

## 2011-07-28 DIAGNOSIS — I10 Essential (primary) hypertension: Secondary | ICD-10-CM

## 2011-07-28 DIAGNOSIS — E119 Type 2 diabetes mellitus without complications: Secondary | ICD-10-CM | POA: Diagnosis not present

## 2011-07-28 DIAGNOSIS — E785 Hyperlipidemia, unspecified: Secondary | ICD-10-CM

## 2011-07-28 DIAGNOSIS — R1310 Dysphagia, unspecified: Secondary | ICD-10-CM

## 2011-07-28 LAB — LIPID PANEL
Cholesterol: 196 mg/dL (ref 0–200)
HDL: 55.7 mg/dL (ref >39.00)
LDL Cholesterol: 109 mg/dL — ABNORMAL HIGH (ref 0–99)
Total CHOL/HDL Ratio: 4
Triglycerides: 155 mg/dL — ABNORMAL HIGH (ref 0.0–149.0)
VLDL: 31 mg/dL (ref 0.0–40.0)

## 2011-07-28 LAB — COMPREHENSIVE METABOLIC PANEL
ALT: 29 U/L (ref 0–53)
AST: 25 U/L (ref 0–37)
Albumin: 4 g/dL (ref 3.5–5.2)
Alkaline Phosphatase: 82 U/L (ref 39–117)
BUN: 22 mg/dL (ref 6–23)
CO2: 29 mEq/L (ref 19–32)
Calcium: 9.8 mg/dL (ref 8.4–10.5)
Chloride: 100 mEq/L (ref 96–112)
Creatinine, Ser: 1.5 mg/dL (ref 0.4–1.5)
GFR: 49.04 mL/min — ABNORMAL LOW (ref >60.00)
Glucose, Bld: 152 mg/dL — ABNORMAL HIGH (ref 70–99)
Potassium: 3.9 mEq/L (ref 3.5–5.1)
Sodium: 140 mEq/L (ref 135–145)
Total Bilirubin: 0.7 mg/dL (ref 0.3–1.2)
Total Protein: 7.2 g/dL (ref 6.0–8.3)

## 2011-07-28 LAB — CBC WITH DIFFERENTIAL/PLATELET
Basophils Absolute: 0 10*3/uL (ref 0.0–0.1)
Basophils Relative: 0.8 % (ref 0.0–3.0)
Eosinophils Absolute: 0.1 10*3/uL (ref 0.0–0.7)
Eosinophils Relative: 1.5 % (ref 0.0–5.0)
HCT: 41.2 % (ref 39.0–52.0)
Hemoglobin: 14.1 g/dL (ref 13.0–17.0)
Lymphocytes Relative: 36.2 % (ref 12.0–46.0)
Lymphs Abs: 1.9 10*3/uL (ref 0.7–4.0)
MCHC: 34.1 g/dL (ref 30.0–36.0)
MCV: 92.8 fl (ref 78.0–100.0)
Monocytes Absolute: 0.4 10*3/uL (ref 0.1–1.0)
Monocytes Relative: 7.7 % (ref 3.0–12.0)
Neutro Abs: 2.9 10*3/uL (ref 1.4–7.7)
Neutrophils Relative %: 53.8 % (ref 43.0–77.0)
Platelets: 142 10*3/uL — ABNORMAL LOW (ref 150.0–400.0)
RBC: 4.44 Mil/uL (ref 4.22–5.81)
RDW: 12.6 % (ref 11.5–14.6)
WBC: 5.3 10*3/uL (ref 4.5–10.5)

## 2011-07-28 LAB — HEPATIC FUNCTION PANEL
ALT: 29 U/L (ref 0–53)
AST: 25 U/L (ref 0–37)
Albumin: 4 g/dL (ref 3.5–5.2)
Alkaline Phosphatase: 82 U/L (ref 39–117)
Bilirubin, Direct: 0.1 mg/dL (ref 0.0–0.3)
Total Bilirubin: 0.7 mg/dL (ref 0.3–1.2)
Total Protein: 7.2 g/dL (ref 6.0–8.3)

## 2011-07-28 LAB — HEMOGLOBIN A1C: Hgb A1c MFr Bld: 8.7 % — ABNORMAL HIGH (ref 4.6–6.5)

## 2011-07-28 MED ORDER — TADALAFIL 20 MG PO TABS: 10.0000 mg | ORAL_TABLET | ORAL | Status: DC | PRN

## 2011-07-28 NOTE — Patient Instructions (Signed)
Normal exam today except that your feet are loosing sensation, possibly due to the diabetes. For lab today. You will receive a full report.  Immunizations today: pneumonia vaccine - good for a lifetime and tetanus booster.

## 2011-07-28 NOTE — Progress Notes (Signed)
Subjective:    Patient ID: Scott Wilkins, male    DOB: [birth year], 71 y.o.   MRN: [id number]  HPI The patient is here for annual Medicare wellness examination and management of other chronic and acute problems. He was last seen in March '10. Interval history - no major illness, no surgery, no injury.    The risk factors are reflected in the social history.  The roster of all physicians providing medical care to patient - is listed in the Snapshot section of the chart.  Activities of daily living:  The patient is 100% inedpendent in all ADLs: dressing, toileting, feeding as well as independent mobility  Home safety : The patient has no smoke detectors in the home. Falls - none, has grab bars.They wear seatbelts. firearms are present in the home, kept in a safe fashion. There is no violence in the home.   There is no risks for hepatitis, STDs or HIV. There is no   history of blood transfusion. They have no travel history to infectious disease endemic areas of the world.  The patient has  not seen their dentist in the last six month - full dentures. They have not seen their eye doctor in the last 3 years. They deny any hearing difficulty and have not had audiologic testing in the last year.  They do not  have excessive sun exposure. Discussed the need for sun protection: hats, long sleeves and use of sunscreen if there is significant sun exposure.   Diet: the importance of a healthy diet is discussed. They do have a healthy diet.  The patient has no regular exercise program.  The benefits of regular aerobic exercise were discussed.  Depression screen: there are no signs or vegative symptoms of depression- irritability, change in appetite, anhedonia, sadness/tearfullness.  Cognitive assessment: the patient manages all their financial and personal affairs and is actively engaged.  The following portions of the patient's history were reviewed and updated as appropriate: allergies, current  medications, past family history, past medical history,  past surgical history, past social history  and problem list.  Vision, hearing, body mass index were assessed and reviewed.   During the course of the visit the patient was educated and counseled about appropriate screening and preventive services including : fall prevention , diabetes screening, nutrition counseling, colorectal cancer screening, and recommended immunizations.  Past Medical History  Diagnosis Date  . Hypertension   . Hyperlipidemia   . Diabetes mellitus   . Rosacea   . Seizures     h/o - none in years   Past Surgical History  Procedure Date  . Craniotomy 1948    left craniectomy after head trauma  . Rib fracture surgery 1976    ORIF after frcture due to trauma   Family History  Problem Relation Age of Onset  . Heart disease Mother     CAD  . Heart disease Father     CAD  . Heart disease Brother     CAD  . Cancer Neg Hx   . Diabetes Neg Hx   . COPD Neg Hx   . Heart disease Brother     CAD   History   Social History  . Marital Status: Single    Spouse Name: N/A    Number of Children: 3  . Years of Education: 8   Occupational History  . truck driver     retired   Social History Main Topics  . Smoking status: Never Smoker   .  Smokeless tobacco: Never Used  . Alcohol Use: No  . Drug Use: No  . Sexually Active: No   Other Topics Concern  . Not on file   Social History Narrative   Finished 8th grade. Married '64. 3 dtrs. 5 Grandchildren. Work - retired Naval architect.            Review of Systems Constitutional:  Negative for fever, chills, activity change and unexpected weight change.  HEENT:  Negative for hearing loss, ear pain, congestion, neck stiffness and postnasal drip. Negative for sore throat or swallowing problems. Negative for dental complaints.   Eyes: Negative for vision loss or change in visual acuity.  Respiratory: Negative for chest tightness and wheezing.  Negative for DOE.   Cardiovascular: Negative for chest pain or palpitations. No decreased exercise tolerance Gastrointestinal: No change in bowel habit. Some bloating or gas. No reflux or indigestion. He does feel like food gets stuck. He does take gasX Genitourinary: Negative for urgency, frequency, flank pain and difficulty urinating.  Musculoskeletal: Negative for myalgias, back pain, arthralgias and gait problem.  Neurological: Negative for dizziness, tremors, weakness and headaches.  Hematological: Negative for adenopathy.  Psychiatric/Behavioral: Negative for behavioral problems and dysphoric mood.       Objective:   Physical Exam Filed Vitals:   07/28/11 1520  BP: 128/68  Pulse: 69  Temp: 98.5 F (36.9 C)  Resp: 16  Weight: 169 lb 12 oz (76.998 kg)  There is no height on file to calculate BMI. Filed Vitals:   07/28/11 1520  BP: 128/68  Pulse: 69  Temp: 98.5 F (36.9 C)  Resp: 16   Gen'l: Well nourished well developed white male in no acute distress  HEENT: Head: Normocephalic and atraumatic. Right Ear: External ear normal. EAC/TM old perforation. Left Ear: External ear normal.  EAC/TM nl. Nose: Nose normal. Mouth/Throat: Oropharynx is clear and moist. Dentition - full dentures. No buccal or palatal lesions. Posterior pharynx clear. Eyes: Conjunctivae and sclera clear. EOM intact. Pupils are equal, round, and reactive to light. Right eye exhibits no discharge. Left eye exhibits no discharge. Neck: Normal range of motion. Neck supple. No JVD present. No tracheal deviation present. No thyromegaly present.  Cardiovascular: Normal rate, regular rhythm, no gallop, no friction rub, no murmur heard.      Quiet precordium. 2+ radial and DP pulses . No carotid bruits Pulmonary/Chest: Effort normal. No respiratory distress or increased WOB, no wheezes, no rales. No chest wall deformity or CVAT. Abdominal: Soft. Bowel sounds are normal in all quadrants. He exhibits no distension, no  tenderness, no rebound or guarding, No heptosplenomegaly  Genitourinary:  deferred Musculoskeletal: Normal range of motion. He exhibits no edema and no tenderness.       Small and large joints without redness, synovial thickening or deformity. Full range of motion preserved about all small, median and large joints.  Lymphadenopathy:    He has no cervical or supraclavicular adenopathy.  Neurological: He is alert and oriented to person, place, and time. CN II-XII intact. DTRs 2+ and symmetrical biceps, radial and patellar tendons. Cerebellar function normal with no tremor, rigidity, normal gait and station.  Skin: Skin is warm and dry. No rash noted. No erythema. Decreased sensation to light touch, pin prick and deep vibratory sensation bilaterally.  Psychiatric: He has a normal mood and affect. His behavior is normal. Thought content normal.   Lab Results  Component Value Date   WBC 5.3 07/28/2011   HGB 14.1 07/28/2011   HCT  41.2 07/28/2011   PLT 142.0* 07/28/2011   GLUCOSE 152* 07/28/2011   CHOL 196 07/28/2011   TRIG 155.0* 07/28/2011   HDL 55.70 07/28/2011   LDLDIRECT 126.0 08/22/2008   LDLCALC 109* 07/28/2011        ALT 29 07/28/2011   AST 25 07/28/2011        NA 140 07/28/2011   K 3.9 07/28/2011   CL 100 07/28/2011   CREATININE 1.5 07/28/2011   BUN 22 07/28/2011   CO2 29 07/28/2011   PSA 1.14 08/22/2008   HGBA1C 8.7* 07/28/2011             Assessment & Plan:

## 2011-07-31 ENCOUNTER — Encounter: Payer: Self-pay | Admitting: Internal Medicine

## 2011-07-31 DIAGNOSIS — Z0000 Encounter for general adult medical examination without abnormal findings: Secondary | ICD-10-CM | POA: Insufficient documentation

## 2011-07-31 NOTE — Assessment & Plan Note (Signed)
Interval history - unremarkable for any major illness, surgery or injury. Physical exam is normal. Lab results are in normal range except for A1C. He is current with colorectal cancer screening. He is due  For immuniations: tetanus, pneumonia vaccine and shingles vaccine.  In summary - a nice man who needs to be more regular in follow-up given his multiple problems = metabolic syndrome with increased risk for cardiovascular disease. Increased exercise is recommended. He will need to return in regard to better management of his diabetes.

## 2011-07-31 NOTE — Assessment & Plan Note (Signed)
Lab Results  Component Value Date   HGBA1C 8.7* 07/28/2011   Poor control. Currently on glimeperide and metformin.  Plan - return office visit to discuss treatment options.

## 2011-07-31 NOTE — Assessment & Plan Note (Signed)
LDL @ 109 is just above goal of 100 or less.  Plan - continue without medical therapy but continue dietary management

## 2011-07-31 NOTE — Assessment & Plan Note (Signed)
BP Readings from Last 3 Encounters:  07/28/11 128/68  08/22/08 128/80  07/16/08 112/60   Good control of BP on current medications that includes RAS class drug.

## 2011-08-01 ENCOUNTER — Other Ambulatory Visit: Payer: Self-pay | Admitting: Internal Medicine

## 2011-08-12 ENCOUNTER — Other Ambulatory Visit: Payer: Self-pay | Admitting: *Deleted

## 2011-08-12 MED ORDER — METFORMIN HCL 1000 MG PO TABS: 1000.0000 mg | ORAL_TABLET | Freq: Two times a day (BID) | ORAL | Status: DC

## 2011-08-31 ENCOUNTER — Ambulatory Visit (INDEPENDENT_AMBULATORY_CARE_PROVIDER_SITE_OTHER): Payer: Medicare Other | Admitting: Internal Medicine

## 2011-08-31 ENCOUNTER — Encounter: Payer: Self-pay | Admitting: Internal Medicine

## 2011-08-31 VITALS — BP 136/70 | HR 84 | Temp 98.6°F | Wt 168.0 lb

## 2011-08-31 DIAGNOSIS — J329 Chronic sinusitis, unspecified: Secondary | ICD-10-CM | POA: Diagnosis not present

## 2011-08-31 MED ORDER — AMOXICILLIN-POT CLAVULANATE 875-125 MG PO TABS: 1.0000 | ORAL_TABLET | Freq: Two times a day (BID) | ORAL | Status: AC

## 2011-08-31 MED ORDER — PROMETHAZINE-CODEINE 6.25-10 MG/5ML PO SYRP: 5.0000 mL | ORAL_SOLUTION | ORAL | Status: AC | PRN

## 2011-08-31 NOTE — Progress Notes (Signed)
  Subjective:    Patient ID: Scott Wilkins, male    DOB: 04-04-1941, 71 y.o.   MRN: [id number]  HPI [name] presents with 72 hour h/o cough, rhinorrhea, HA, chest hurts from coughing. NO fever, no sputum production. He has been short of breath. NO cough syrup. Lots of post-nasal drainage.Lots of head pressure and pain.   Past Medical History  Diagnosis Date  . Hypertension   . Hyperlipidemia   . Diabetes mellitus   . Rosacea   . Seizures     h/o - none in years   Past Surgical History  Procedure Date  . Craniotomy 1948    left craniectomy after head trauma  . Rib fracture surgery 1976    ORIF after frcture due to trauma   Family History  Problem Relation Age of Onset  . Heart disease Mother     CAD  . Heart disease Father     CAD  . Heart disease Brother     CAD  . Cancer Neg Hx   . Diabetes Neg Hx   . COPD Neg Hx   . Heart disease Brother     CAD   History   Social History  . Marital Status: Single    Spouse Name: N/A    Number of Children: 3  . Years of Education: 8   Occupational History  . truck driver     retired   Social History Main Topics  . Smoking status: Never Smoker   . Smokeless tobacco: Never Used  . Alcohol Use: No  . Drug Use: No  . Sexually Active: No   Other Topics Concern  . Not on file   Social History Narrative   Finished 8th grade. Married '64. 3 dtrs. 5 Grandchildren. Work - retired Naval architect.      Review of Systems System review is negative for any constitutional, cardiac, pulmonary, GI or neuro symptoms or complaints other than as described in the HPI.     Objective:   Physical Exam Filed Vitals:   08/31/11 1605  BP: 136/70  Pulse: 84  Temp: 98.6 F (37 C)   Oxygen sat 92% Gen'l- overweight white man in no acute distress HEENT- tender to percussion over the frontal sinus, right > left. Throat is clear Neck- supple NOdes- negative in the cervical region Cor- RRR Pulm - normal respirations, no rales no  wheezes.        Assessment & Plan:  Sinusitis - may  Be viral or bacterial. The absence of fever favors the former.  Plan - augmentin 875 bid x 7 days           Supportive care           Prom/cod for cough, sudafed, hydrate, mucinex.

## 2011-08-31 NOTE — Patient Instructions (Signed)
Symptoms and exam are consistent with sinus infection in the frontal sinus, left worse than right.   Plan - Augmentin (antibiotic) twice a day for 7 days; mucinex 1200 mg twice a day; promethazine with codeine cough syrup every 4 hours as needed - may make you drowsy; sudafed (generic) 30 mg three times a day to relieve the pressure; stove top vaporizer - pot of water on the stove to a boil, turn it off, add Vicks and make a towel tent and breath in the steam. Tylenol 500 mg two tablets three times a day until you are feeling better.    Sinusitis Sinuses are air pockets within the bones of your face. The growth of bacteria within a sinus leads to infection. The infection prevents the sinuses from draining. This infection is called sinusitis. SYMPTOMS   There will be different areas of pain depending on which sinuses have become infected.  The maxillary sinuses often produce pain beneath the eyes.   Frontal sinusitis may cause pain in the middle of the forehead and above the eyes.  Other problems (symptoms) include:  Toothaches.   Colored, pus-like (purulent) drainage from the nose.   Swelling, warmth, and tenderness over the sinus areas may be signs of infection.  TREATMENT   Sinusitis is most often determined by an exam.X-rays may be taken. If x-rays have been taken, make sure you obtain your results or find out how you are to obtain them. Your caregiver may give you medications (antibiotics). These are medications that will help kill the bacteria causing the infection. You may also be given a medication (decongestant) that helps to reduce sinus swelling.   HOME CARE INSTRUCTIONS    Only take over-the-counter or prescription medicines for pain, discomfort, or fever as directed by your caregiver.   Drink extra fluids. Fluids help thin the mucus so your sinuses can drain more easily.   Applying either moist heat or ice packs to the sinus areas may help relieve discomfort.   Use saline  nasal sprays to help moisten your sinuses. The sprays can be found at your local drugstore.  SEEK IMMEDIATE MEDICAL CARE IF:  You have a fever.   You have increasing pain, severe headaches, or toothache.   You have nausea, vomiting, or drowsiness.   You develop unusual swelling around the face or trouble seeing.  MAKE SURE YOU:    Understand these instructions.   Will watch your condition.   Will get help right away if you are not doing well or get worse.  Document Released: 05/30/2005 Document Revised: 05/19/2011 Document Reviewed: 12/27/2006 [facility] Patient Information 2012 [place], [place].

## 2012-01-27 ENCOUNTER — Other Ambulatory Visit: Payer: Self-pay | Admitting: Internal Medicine

## 2012-02-15 ENCOUNTER — Encounter: Payer: Self-pay | Admitting: Internal Medicine

## 2012-02-15 ENCOUNTER — Ambulatory Visit (INDEPENDENT_AMBULATORY_CARE_PROVIDER_SITE_OTHER): Payer: Medicare Other | Admitting: Internal Medicine

## 2012-02-15 VITALS — BP 112/70 | HR 80 | Temp 98.0°F | Resp 16 | Wt 168.0 lb

## 2012-02-15 DIAGNOSIS — K2970 Gastritis, unspecified, without bleeding: Secondary | ICD-10-CM | POA: Diagnosis not present

## 2012-02-15 MED ORDER — OMEPRAZOLE 40 MG PO CPDR: 40.0000 mg | DELAYED_RELEASE_CAPSULE | Freq: Every day | ORAL | Status: DC

## 2012-02-15 NOTE — Progress Notes (Signed)
Subjective:     Patient ID: Scott Wilkins, male   DOB: Oct 10, 1940, 71 y.o.   MRN: [id number]  HPI Comments: Scott Wilkins is a 71 yo male with a history of hyperlipidemia and DM 2 who has come into clinic today presenting with abdominal pain that began a year ago and has been getting worse. The pain is in the epigastric region of his abdomen. He states that the pain is at it worst today which has prompted him to have it evaluated. The pain began as a 5/10 and is an 8/10 today. The pain gets worse after he eats a meal. He states that he has regular loose bowel movements and has had no recent changes in his bowel movements. The patient also reported that he noticed blood in his stool a year ago. He had this evaluated by another physician who could not find a cause. Since then he has not noticed anymore blood in his stools. His  last colonoscopy was in 2010 and showed no findings. He last had an endoscopy in February 2010 which showed a single sessile polyp. He has been feeling more fatigued than usual in the past 3-4 months and takes an additional nap each day. He reports that he sleeps through the night and does not wake up gasping for breath. His wife reports that he snores.The patient does not reports fever, chills, shortness of breath, changes in urination, or chest pain. The patient reports that he does have difficulty urinating when he first wakes up, but this improves as his day progresses.    Review of Systems  All other systems reviewed and are negative.       Objective:   Physical Exam  Constitutional: He is oriented to person, place, and time. He appears well-nourished. No distress.  Eyes: No scleral icterus.  Pulmonary/Chest: Effort normal. No respiratory distress.  Abdominal: Soft. Bowel sounds are normal. He exhibits no distension and no mass. There is tenderness (Patient was tender to deep palpation in the epigastric region. He has a positive murphy sign.). There is no rebound and no  guarding.         No abdominal bruits could be auscultated.  Neurological: He is alert and oriented to person, place, and time.  Skin: He is not diaphoretic.  Psychiatric: He has a normal mood and affect. His behavior is normal. Judgment and thought content normal.       Assessment & Plan:     1. Epigastric Abdominal Pain - The pain increases after eating and has slowly gotten worse. This is possibly gastritis.  Plan - Begin omeprazole.    Attending note for Scott Sleeper, MS III - patient interviewed and examined, history reviewed. He is very tender at the epigastrum, no Murphy sign on my exam. Presentation c/w gastritis without stigmata of ulcer. Will start omeprazole 40 mg qAM before breakfast for 28 days, then H2 blocker OTC bid for 2 weeks then prn.

## 2012-02-15 NOTE — Patient Instructions (Addendum)
Pain in the belly is consistent with acid related stomach damage - gastritis. There is no evidence of an ulcer, liver disease or gallbladder disease.  Plan - omeprazole 40 mg once a day in the AM, before breakfast, for 28 days. Then, take over the counter zantac or pepcid (both come as generics) twice a day for another 14 days and then as needed.   If you do not see real improvement in several days and the pain persists call for a referral to GI   Gastritis Gastritis is an inflammation (the body's way of reacting to injury and/or infection) of the stomach. It is often caused by viral or bacterial (germ) infections. It can also be caused by chemicals (including alcohol) and medications. This illness may be associated with generalized malaise (feeling tired, not well), cramps, and fever. The illness may last 2 to 7 days. If symptoms of gastritis continue, gastroscopy (looking into the stomach with a telescope-like instrument), biopsy (taking tissue samples), and/or blood tests may be necessary to determine the cause. Antibiotics will not affect the illness unless there is a bacterial infection present. One common bacterial cause of gastritis is an organism known as H. Pylori. This can be treated with antibiotics. Other forms of gastritis are caused by too much acid in the stomach. They can be treated with medications such as H2 blockers and antacids. Home treatment is usually all that is needed. Young children will quickly become dehydrated (loss of body fluids) if vomiting and diarrhea are both present. Medications may be given to control nausea. Medications are usually not given for diarrhea unless especially bothersome. Some medications slow the removal of the virus from the gastrointestinal tract. This slows down the healing process. HOME CARE INSTRUCTIONS Home care instructions for nausea and vomiting:  For adults: drink small amounts of fluids often. Drink at least 2 quarts a day. Take sips frequently.  Do not drink large amounts of fluid at one time. This may worsen the nausea.   Only take over-the-counter or prescription medicines for pain, discomfort, or fever as directed by your caregiver.   Drink clear liquids only. Those are anything you can see through such as water, broth, or soft drinks.   Once you are keeping clear liquids down, you may start full liquids, soups, juices, and ice cream or sherbet. Slowly add bland (plain, not spicy) foods to your diet.  Home care instructions for diarrhea:  Diarrhea can be caused by bacterial infections or a virus. Your condition should improve with time, rest, fluids, and/or anti-diarrheal medication.   Until your diarrhea is under control, you should drink clear liquids often in small amounts. Clear liquids include: water, broth, jell-o water and weak tea.  Avoid:  Milk.   Fruits.   Tobacco.   Alcohol.   Extremely hot or cold fluids.   Too much intake of anything at one time.  When your diarrhea stops you may add the following foods, which help the stool to become more formed:  Rice.   Bananas.   Apples without skin.   Dry toast.  Once these foods are tolerated you may add low-fat yogurt and low-fat cottage cheese. They will help to restore the normal bacterial balance in your bowel. Wash your hands well to avoid spreading bacteria (germ) or virus. SEEK IMMEDIATE MEDICAL CARE IF:    You are unable to keep fluids down.   Vomiting or diarrhea become persistent (constant).   Abdominal pain develops, increases, or localizes. (Right sided pain can  be appendicitis. Left sided pain in adults can be diverticulitis.)   You develop a fever (an oral temperature above 102 F (38.9 C)).   Diarrhea becomes excessive or contains blood or mucus.   You have excessive weakness, dizziness, fainting or extreme thirst.   You are not improving or you are getting worse.   You have any other questions or concerns.  Document Released:  05/24/2001 Document Revised: 05/19/2011 Document Reviewed: 05/30/2005 [facility] Patient Information 2012 [place], [place].

## 2012-04-04 ENCOUNTER — Telehealth: Payer: Self-pay | Admitting: *Deleted

## 2012-04-04 DIAGNOSIS — R1013 Epigastric pain: Secondary | ICD-10-CM

## 2012-04-04 NOTE — Telephone Encounter (Signed)
Patient daughter called concern of her Dad's on going problem with stomach pain. States this has been going on for years. Patient has done medications that you ordered at last visit 02/2012 which has not helped. Daughter ,Rose Fillers reqeust the referral to GI doctor of Dr, Charna Elizabeth at [facility] . Phone # [phone]. Please call daughter at her # [phone] when a referral has been done so she can make sure he keeps appointment.   Marland Kitchen

## 2012-04-04 NOTE — Telephone Encounter (Signed)
Referral entered  

## 2012-04-05 NOTE — Telephone Encounter (Signed)
Left message on daughter # of referral had been sent By Dr. Debby Bud to GI doctor she reccommended.

## 2012-04-23 ENCOUNTER — Telehealth: Payer: Self-pay | Admitting: Internal Medicine

## 2012-04-23 NOTE — Telephone Encounter (Signed)
Forward  1 paged from [facility] to Dr. [name] for review on 04-23-12 ym

## 2012-04-26 ENCOUNTER — Other Ambulatory Visit: Payer: Self-pay | Admitting: Gastroenterology

## 2012-04-26 DIAGNOSIS — R1319 Other dysphagia: Secondary | ICD-10-CM | POA: Diagnosis not present

## 2012-04-26 DIAGNOSIS — K219 Gastro-esophageal reflux disease without esophagitis: Secondary | ICD-10-CM | POA: Diagnosis not present

## 2012-04-26 DIAGNOSIS — R1013 Epigastric pain: Secondary | ICD-10-CM | POA: Diagnosis not present

## 2012-04-26 DIAGNOSIS — R110 Nausea: Secondary | ICD-10-CM | POA: Diagnosis not present

## 2012-05-09 DIAGNOSIS — R110 Nausea: Secondary | ICD-10-CM | POA: Diagnosis not present

## 2012-05-09 DIAGNOSIS — R1013 Epigastric pain: Secondary | ICD-10-CM | POA: Diagnosis not present

## 2012-05-09 DIAGNOSIS — R1310 Dysphagia, unspecified: Secondary | ICD-10-CM | POA: Diagnosis not present

## 2012-05-09 DIAGNOSIS — K219 Gastro-esophageal reflux disease without esophagitis: Secondary | ICD-10-CM | POA: Diagnosis not present

## 2012-05-09 DIAGNOSIS — K2990 Gastroduodenitis, unspecified, without bleeding: Secondary | ICD-10-CM | POA: Diagnosis not present

## 2012-05-09 DIAGNOSIS — K2980 Duodenitis without bleeding: Secondary | ICD-10-CM | POA: Diagnosis not present

## 2012-05-09 DIAGNOSIS — K2970 Gastritis, unspecified, without bleeding: Secondary | ICD-10-CM | POA: Diagnosis not present

## 2012-05-09 DIAGNOSIS — R1319 Other dysphagia: Secondary | ICD-10-CM | POA: Diagnosis not present

## 2012-05-09 DIAGNOSIS — R141 Gas pain: Secondary | ICD-10-CM | POA: Diagnosis not present

## 2012-05-18 ENCOUNTER — Encounter (HOSPITAL_COMMUNITY)
Admission: RE | Admit: 2012-05-18 | Discharge: 2012-05-18 | Disposition: A | Discharge status: Home / Self Care | Payer: Medicare Other | Source: Ambulatory Visit | Attending: Gastroenterology | Admitting: Gastroenterology

## 2012-05-18 ENCOUNTER — Ambulatory Visit (HOSPITAL_COMMUNITY)
Admission: RE | Admit: 2012-05-18 | Discharge: 2012-05-18 | Disposition: A | Discharge status: Home / Self Care | Payer: Medicare Other | Source: Ambulatory Visit | Attending: Gastroenterology | Admitting: Gastroenterology

## 2012-05-18 ENCOUNTER — Encounter (HOSPITAL_COMMUNITY): Payer: Self-pay

## 2012-05-18 DIAGNOSIS — R1013 Epigastric pain: Secondary | ICD-10-CM | POA: Insufficient documentation

## 2012-05-18 MED ORDER — TECHNETIUM TC 99M MEBROFENIN IV KIT
5.5000 | PACK | Freq: Once | INTRAVENOUS | Status: AC | PRN
  Administered 2012-05-18: 5.5 via INTRAVENOUS

## 2012-05-24 DIAGNOSIS — K439 Ventral hernia without obstruction or gangrene: Secondary | ICD-10-CM | POA: Diagnosis not present

## 2012-05-24 DIAGNOSIS — R142 Eructation: Secondary | ICD-10-CM | POA: Diagnosis not present

## 2012-05-24 DIAGNOSIS — R1013 Epigastric pain: Secondary | ICD-10-CM | POA: Diagnosis not present

## 2012-05-24 DIAGNOSIS — R143 Flatulence: Secondary | ICD-10-CM | POA: Diagnosis not present

## 2012-05-24 DIAGNOSIS — K219 Gastro-esophageal reflux disease without esophagitis: Secondary | ICD-10-CM | POA: Diagnosis not present

## 2012-05-24 DIAGNOSIS — R141 Gas pain: Secondary | ICD-10-CM | POA: Diagnosis not present

## 2012-07-27 ENCOUNTER — Other Ambulatory Visit: Payer: Self-pay | Admitting: Internal Medicine

## 2012-08-09 ENCOUNTER — Telehealth: Payer: Self-pay | Admitting: *Deleted

## 2012-08-09 NOTE — Telephone Encounter (Signed)
[facility] called on the status of faxed form for medical supplies to be ordered,

## 2012-08-10 ENCOUNTER — Telehealth: Payer: Self-pay | Admitting: *Deleted

## 2012-08-10 NOTE — Telephone Encounter (Signed)
Please read note below and be advised.  

## 2012-08-10 NOTE — Telephone Encounter (Signed)
Can you call pt and schedule an OV? Thank you!

## 2012-08-10 NOTE — Telephone Encounter (Signed)
Patients wife said she is returning a call, she did request the Ed pump, back brace, and knee brace for patient

## 2012-08-10 NOTE — Telephone Encounter (Signed)
OK. Needs OV

## 2012-08-11 ENCOUNTER — Other Ambulatory Visit: Payer: Self-pay | Admitting: Internal Medicine

## 2012-08-13 ENCOUNTER — Other Ambulatory Visit: Payer: Self-pay | Admitting: *Deleted

## 2012-08-13 ENCOUNTER — Telehealth: Payer: Self-pay | Admitting: *Deleted

## 2012-08-13 MED ORDER — LISINOPRIL-HYDROCHLOROTHIAZIDE 10-12.5 MG PO TABS: 1.0000 | ORAL_TABLET | Freq: Every day | ORAL | Status: DC

## 2012-08-13 NOTE — Telephone Encounter (Signed)
Please call pt and schedule OV

## 2012-08-14 NOTE — Telephone Encounter (Signed)
LMOM TO CALL FOR AN APPT °

## 2012-08-27 ENCOUNTER — Telehealth: Payer: Self-pay

## 2012-08-27 NOTE — Telephone Encounter (Signed)
I called, LM for pt to return call. We received a fax from [facility] stating pt requested an order for a knee orthosis brace. Want to just verify this is correct before Dr Debby Bud fills out the paperwork.

## 2012-08-28 ENCOUNTER — Ambulatory Visit (INDEPENDENT_AMBULATORY_CARE_PROVIDER_SITE_OTHER): Payer: Medicare Other | Admitting: Internal Medicine

## 2012-08-28 ENCOUNTER — Other Ambulatory Visit (INDEPENDENT_AMBULATORY_CARE_PROVIDER_SITE_OTHER): Payer: Medicare Other

## 2012-08-28 ENCOUNTER — Encounter: Payer: Self-pay | Admitting: Internal Medicine

## 2012-08-28 VITALS — BP 122/86 | HR 82 | Temp 97.5°F | Resp 16 | Ht 66.5 in | Wt 166.0 lb

## 2012-08-28 DIAGNOSIS — E785 Hyperlipidemia, unspecified: Secondary | ICD-10-CM | POA: Diagnosis not present

## 2012-08-28 DIAGNOSIS — M6208 Separation of muscle (nontraumatic), other site: Secondary | ICD-10-CM | POA: Insufficient documentation

## 2012-08-28 DIAGNOSIS — E119 Type 2 diabetes mellitus without complications: Secondary | ICD-10-CM | POA: Diagnosis not present

## 2012-08-28 DIAGNOSIS — I10 Essential (primary) hypertension: Secondary | ICD-10-CM

## 2012-08-28 DIAGNOSIS — Z0000 Encounter for general adult medical examination without abnormal findings: Secondary | ICD-10-CM

## 2012-08-28 LAB — HEPATIC FUNCTION PANEL
ALT: 31 U/L (ref 0–53)
AST: 23 U/L (ref 0–37)
Albumin: 4.2 g/dL (ref 3.5–5.2)
Alkaline Phosphatase: 82 U/L (ref 39–117)
Bilirubin, Direct: 0.1 mg/dL (ref 0.0–0.3)
Total Bilirubin: 0.7 mg/dL (ref 0.3–1.2)
Total Protein: 7.6 g/dL (ref 6.0–8.3)

## 2012-08-28 LAB — LIPID PANEL
Cholesterol: 195 mg/dL (ref 0–200)
HDL: 47.7 mg/dL (ref >39.00)
LDL Cholesterol: 113 mg/dL — ABNORMAL HIGH (ref 0–99)
Total CHOL/HDL Ratio: 4
Triglycerides: 171 mg/dL — ABNORMAL HIGH (ref 0.0–149.0)
VLDL: 34.2 mg/dL (ref 0.0–40.0)

## 2012-08-28 LAB — COMPREHENSIVE METABOLIC PANEL
ALT: 31 U/L (ref 0–53)
AST: 23 U/L (ref 0–37)
Albumin: 4.2 g/dL (ref 3.5–5.2)
Alkaline Phosphatase: 82 U/L (ref 39–117)
BUN: 27 mg/dL — ABNORMAL HIGH (ref 6–23)
CO2: 28 mEq/L (ref 19–32)
Calcium: 9.5 mg/dL (ref 8.4–10.5)
Chloride: 99 mEq/L (ref 96–112)
Creatinine, Ser: 2 mg/dL — ABNORMAL HIGH (ref 0.4–1.5)
GFR: 36.18 mL/min — ABNORMAL LOW (ref >60.00)
Glucose, Bld: 278 mg/dL — ABNORMAL HIGH (ref 70–99)
Potassium: 4.8 mEq/L (ref 3.5–5.1)
Sodium: 136 mEq/L (ref 135–145)
Total Bilirubin: 0.7 mg/dL (ref 0.3–1.2)
Total Protein: 7.6 g/dL (ref 6.0–8.3)

## 2012-08-28 LAB — HEMOGLOBIN A1C: Hgb A1c MFr Bld: 7.9 % — ABNORMAL HIGH (ref 4.6–6.5)

## 2012-08-28 NOTE — Assessment & Plan Note (Signed)
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 Plan: - pt informed that this is not a hernia and does not warrant surgical repair - pt instructed to monitor the area for enlargement, change in shape, pain, and skin changes

## 2012-08-28 NOTE — Assessment & Plan Note (Addendum)
Assessment: Pt's visual disturbance and diet are concerning for poorly controlled DM.  Foot exam shows no evidence of neuropathy at this time Plan: - Check A1c - refill metformin and glimeperide - pt counseled on the importance of yearly eye exams and instructed to set up an appointment ASAP given his visual problems - pt counseled on decreasing sugar intake by slowly replacing soda with water  Addendum:  Lab Results  Component Value Date   HGBA1C 7.9* 08/28/2012

## 2012-08-28 NOTE — Assessment & Plan Note (Addendum)
Interval history in w/o major illness, surgery or injury. Physical exam is unremarkable. Labs reviewed -  Within normal range except Lipids not at goal and A1C elevated. Vaccinations  were reviewed and are up to date. He is current with colorectal cancer screening. Discussed pros and cons of prostate cancer screening (USPHCTF recommendations reviewed and ACU April '13 recommendations) and he defers evaluation at this time with last PSA '10 - normal.  In summary- a very nice man who is medically stable but needs improvement in control of diabetes and hyperlipidemia. He will return for lab in 6 months with further recommendations to follow as indicated.

## 2012-08-28 NOTE — Progress Notes (Signed)
Subjective:     Patient ID: Scott Wilkins, male   DOB: Jul 06, 1940, 72 y.o.   MRN: [id number]  HPI The patient is here for annual Medicare wellness examination and management of other chronic and acute problems. Scott Wilkins is a 72 yo man with a hx of DM that presents today for follow up and medication refills.  He has no acute complaints today.  He reports drinking >= 32 oz of regular soda daily.  He does note that wounds seem to take longer to heal.  The risk factors are reflected in the social history.  The roster of all physicians providing medical care to patient - is listed in the Snapshot section of the chart.  Activities of daily living:  The patient is 100% inedpendent in all ADLs: dressing, toileting, feeding as well as independent mobility   There is no risks for hepatitis, STDs or HIV. There is no   history of blood transfusion. They have no travel history to infectious disease endemic areas of the world.  The patient has  seen their dentist in the last six month. They have seen their eye doctor in the last year. They deny any hearing difficulty and have not had audiologic testing in the last year.  They do not  have excessive sun exposure. Discussed the need for sun protection: hats, long sleeves and use of sunscreen if there is significant sun exposure.   Diet: the importance of a healthy diet is discussed. They do have a healthy (unhealthy-high fat/fast food) diet.  The patient has no regular exercise program.  The benefits of regular aerobic exercise were discussed.  Depression screen: there are no signs or vegative symptoms of depression- irritability, change in appetite, anhedonia, sadness/tearfullness.  Cognitive assessment: the patient manages all their financial and personal affairs and is actively engaged.   The following portions of the patient's history were reviewed and updated as appropriate: allergies, current medications, past family history, past medical history,   past surgical history, past social history  and problem list.  Vision, hearing, body mass index were assessed and reviewed.   During the course of the visit the patient was educated and counseled about appropriate screening and preventive services including : fall prevention , diabetes screening, nutrition counseling, colorectal cancer screening, and recommended immunizations.   Past Medical History  Diagnosis Date  . Hypertension   . Hyperlipidemia   . Rosacea   . Seizures     h/o - none in years  . Diabetes mellitus    Past Surgical History  Procedure Laterality Date  . Craniotomy  1948    left craniectomy after head trauma  . Rib fracture surgery  1976    ORIF after frcture due to trauma   Family History  Problem Relation Age of Onset  . Heart disease Mother     CAD  . Heart disease Father     CAD  . Heart disease Brother     CAD  . Cancer Neg Hx   . Diabetes Neg Hx   . COPD Neg Hx   . Heart disease Brother     CAD   History   Social History  . Marital Status: Married    Spouse Name: N/A    Number of Children: 3  . Years of Education: 8   Occupational History  . truck driver     retired   Social History Main Topics  . Smoking status: Never Smoker   . Smokeless tobacco: Never  Used  . Alcohol Use: No  . Drug Use: No  . Sexually Active: No   Other Topics Concern  . Not on file   Social History Narrative   Finished 8th grade. Married '64. 3 dtrs. 5 Grandchildren. Work - retired Naval architect.   Current Outpatient Prescriptions on File Prior to Visit  Medication Sig Dispense Refill  . glimepiride (AMARYL) 4 MG tablet TAKE ONE-HALF TABLET BY MOUTH TWICE DAILY  90 tablet  3  . lisinopril-hydrochlorothiazide (PRINZIDE,ZESTORETIC) 10-12.5 MG per tablet Take 1 tablet by mouth daily.  30 tablet  2  . metFORMIN (GLUCOPHAGE) 1000 MG tablet TAKE ONE TABLET BY MOUTH TWICE DAILY WITH MEALS  60 tablet  5  . omeprazole (PRILOSEC) 40 MG capsule Take 1 capsule (40 mg  total) by mouth daily.  30 capsule  3  . tadalafil (CIALIS) 20 MG tablet Take 20 mg by mouth every other day as needed. For erectile dysfunction       No current facility-administered medications on file prior to visit.   Review of Systems  Constitutional: Negative for fever, fatigue and unexpected weight change.  HENT: Negative for congestion.   Eyes: Positive for visual disturbance (blurry vision 1-2 x's a week now for a year has not seen an ophthamologist in over a year).  Respiratory: Negative for cough, chest tightness and shortness of breath.   Cardiovascular: Negative for chest pain.  Gastrointestinal: Negative for abdominal pain.       Pt was told in the past that he had a hernia  Endocrine: Negative for polydipsia, polyphagia and polyuria.  Genitourinary: Negative for difficulty urinating.  Skin: Positive for wound (small wounds on legs from walking in woods).  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 Negative for weakness.       Objective:   Physical Exam Filed Vitals:   08/28/12 1029  BP: 122/86  Pulse: 82  Temp: 97.5 F (36.4 C)  Resp: 16  SpO2 - 98%  General: elderly gentleman sitting comfortably in chair in NAD HEENT: Scar and depression on left skull consistent with past craniotomy, EOMI, on fundoscopy red reflex and vessels visualized bilaterally unable to visualize fundus,, no cervical tenderness or lymphadenopathy CV: RRR, no m/r/g, no lower extremity edema appreciated Pulm: CTA bilaterally, tympanitic to percussion Abd: soft, non-tender, non-distended, + BS, with valsalva midline mounding diamond shaped bulge appreciated from xyphoid to umbilicus Extremities: 2+ DP/PT/radial pulses bilaterally Neuro:   Foot exam - left foot sensation assessed d/t baseline decreased sensation in right foot, light touch/pinprick/vibration intact in left foot, no ulcers/wounds appreciated  bilaterally, see extremities for pulses, intact DTRs bilaterally (brachial, patellar, achilles) Psych: mood/affect normal for interaction      Assessment/Plan:   I have personally interviewed and examined Scott Wilkins, reenforced teaching on diet management of diabetes. I agree with the assessment and plan as documented by Mr. Lucretia Roers, MS III

## 2012-08-28 NOTE — Assessment & Plan Note (Addendum)
BP Readings from Last 3 Encounters:  08/28/12 122/86  02/15/12 112/70  08/31/11 136/70   Assessment: BP well controlled  Plan: - refill lisinopril-HCTZ

## 2012-08-29 NOTE — Assessment & Plan Note (Signed)
LDL is above goal of 100 for a diabetic patient, but not by much.  Plan  life-style management: low fat diet and regular exercise.   Will need follow up lipid panel in 6 months (order entered)

## 2012-08-31 ENCOUNTER — Telehealth: Payer: Self-pay

## 2012-08-31 NOTE — Telephone Encounter (Signed)
I called pt and let him know we received the forms for requests of a back brace, knee brace, and vacuum erection therapy system. Per Dr Debby Bud he will need to come in for an OV(even though was just here earlier this week, not for any of these reasons) to go over the forms together. He expressed understanding and I offered to transfer him to the front desk to schedule and appt but states he will call back to do so.

## 2012-09-02 ENCOUNTER — Encounter: Payer: Self-pay | Admitting: Internal Medicine

## 2012-11-26 ENCOUNTER — Other Ambulatory Visit: Payer: Self-pay | Admitting: Internal Medicine

## 2013-01-21 ENCOUNTER — Telehealth: Payer: Self-pay

## 2013-01-21 NOTE — Telephone Encounter (Signed)
Phone call to patient to let him know we received a fax from [facility] stating patient has requested a back brace. Just wanting to verify with the patient if this is indeed correct. Will wait for his phone call.

## 2013-03-05 ENCOUNTER — Other Ambulatory Visit: Payer: Self-pay | Admitting: Internal Medicine

## 2013-03-25 ENCOUNTER — Other Ambulatory Visit: Payer: Self-pay | Admitting: Internal Medicine

## 2013-03-26 DIAGNOSIS — E119 Type 2 diabetes mellitus without complications: Secondary | ICD-10-CM | POA: Diagnosis not present

## 2013-03-26 DIAGNOSIS — H43819 Vitreous degeneration, unspecified eye: Secondary | ICD-10-CM | POA: Diagnosis not present

## 2013-03-26 DIAGNOSIS — H04129 Dry eye syndrome of unspecified lacrimal gland: Secondary | ICD-10-CM | POA: Diagnosis not present

## 2013-03-26 DIAGNOSIS — H2510 Age-related nuclear cataract, unspecified eye: Secondary | ICD-10-CM | POA: Diagnosis not present

## 2013-04-23 ENCOUNTER — Other Ambulatory Visit: Payer: Self-pay | Admitting: *Deleted

## 2013-04-23 MED ORDER — LISINOPRIL-HYDROCHLOROTHIAZIDE 10-12.5 MG PO TABS: 1.0000 | ORAL_TABLET | Freq: Every day | ORAL | Status: DC

## 2013-10-04 ENCOUNTER — Other Ambulatory Visit: Payer: Self-pay | Admitting: *Deleted

## 2013-10-04 MED ORDER — GLIMEPIRIDE 4 MG PO TABS: ORAL_TABLET | ORAL | Status: DC

## 2013-10-08 ENCOUNTER — Other Ambulatory Visit: Payer: Self-pay | Admitting: *Deleted

## 2013-10-08 MED ORDER — METFORMIN HCL 1000 MG PO TABS: ORAL_TABLET | ORAL | Status: DC

## 2013-10-22 ENCOUNTER — Other Ambulatory Visit: Payer: Self-pay | Admitting: Internal Medicine

## 2013-10-22 DIAGNOSIS — A048 Other specified bacterial intestinal infections: Secondary | ICD-10-CM | POA: Diagnosis not present

## 2013-10-22 DIAGNOSIS — R109 Unspecified abdominal pain: Secondary | ICD-10-CM | POA: Diagnosis not present

## 2013-10-22 DIAGNOSIS — E119 Type 2 diabetes mellitus without complications: Secondary | ICD-10-CM | POA: Diagnosis not present

## 2013-10-22 DIAGNOSIS — I10 Essential (primary) hypertension: Secondary | ICD-10-CM | POA: Diagnosis not present

## 2013-10-23 ENCOUNTER — Ambulatory Visit
Admission: RE | Admit: 2013-10-23 | Discharge: 2013-10-23 | Disposition: A | Discharge status: Home / Self Care | Payer: Medicare Other | Source: Ambulatory Visit | Attending: Internal Medicine | Admitting: Internal Medicine

## 2013-10-23 DIAGNOSIS — K7689 Other specified diseases of liver: Secondary | ICD-10-CM | POA: Diagnosis not present

## 2013-10-23 DIAGNOSIS — R109 Unspecified abdominal pain: Secondary | ICD-10-CM

## 2013-10-28 IMAGING — US US ABDOMEN COMPLETE
1 series · 14 of 25 positions shown · non-contrast
Comparison: None.

CLINICAL DATA: Epigastric pain.

COMPLETE ABDOMINAL ULTRASOUND

[Series 1: us abdomen complete · 0.32mm/px · 14 of 56 slices shown]
[im 1/56]
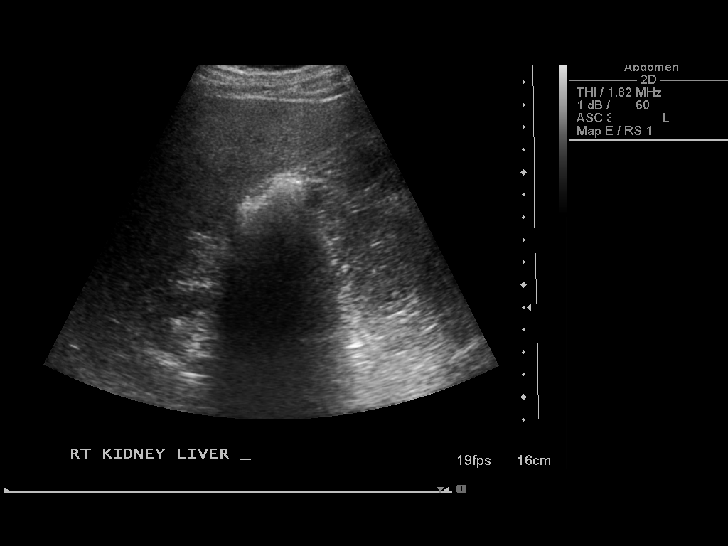
[im 5/56]
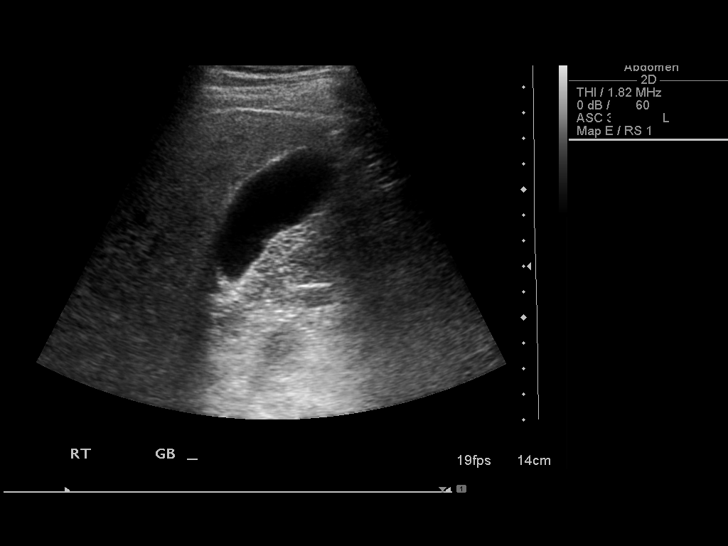
[im 10/56]
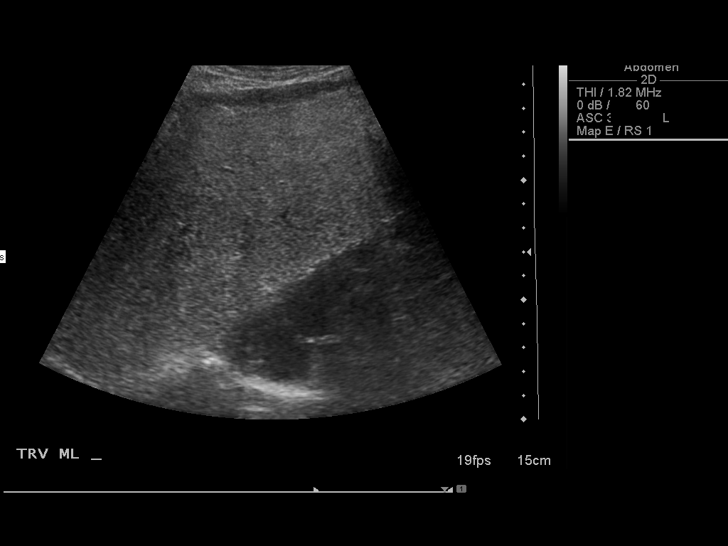
[im 14/56]
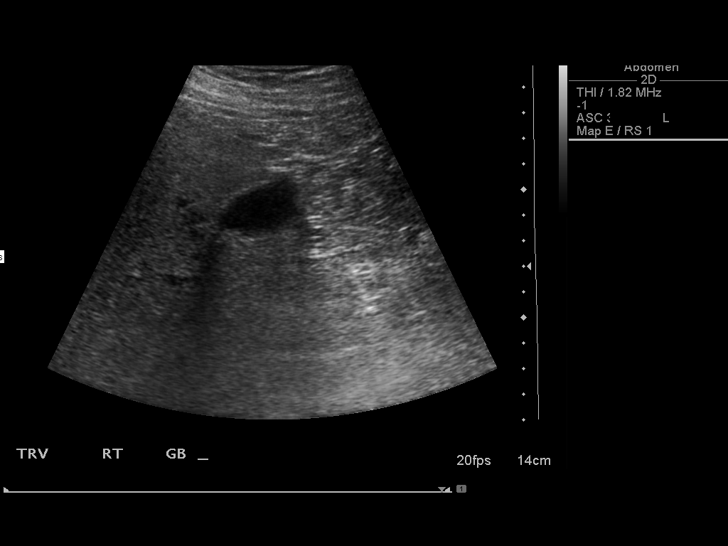
[im 19/56]
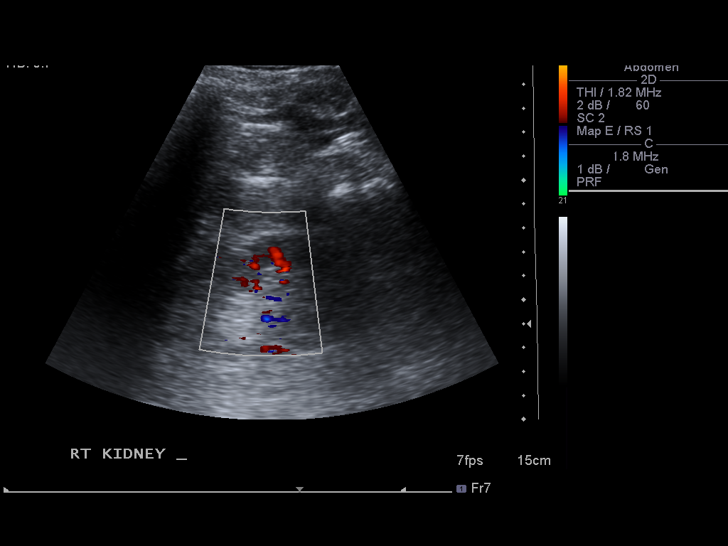
[im 21/56]
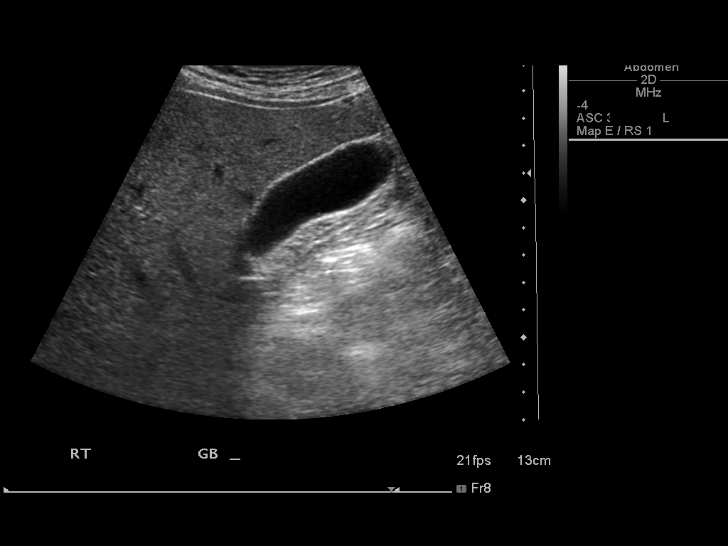
[im 26/56]
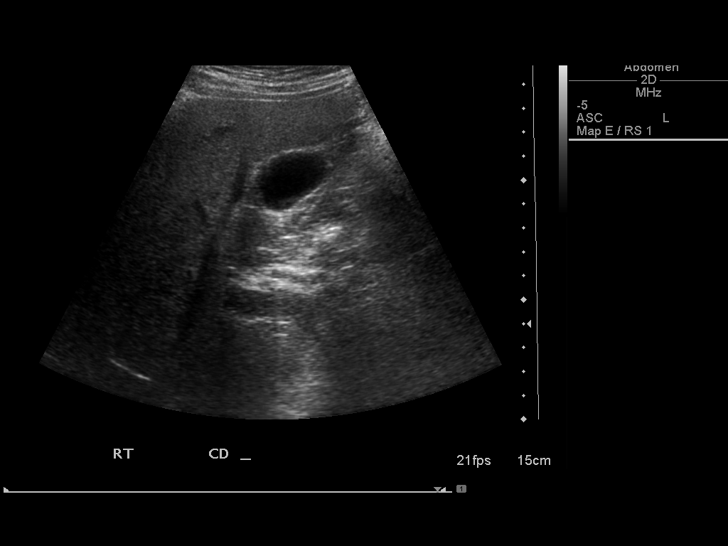
[im 30/56]
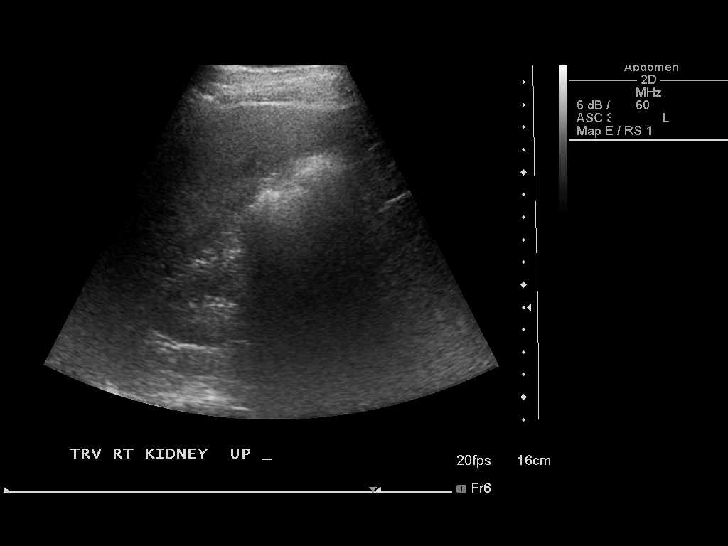
[im 35/56]
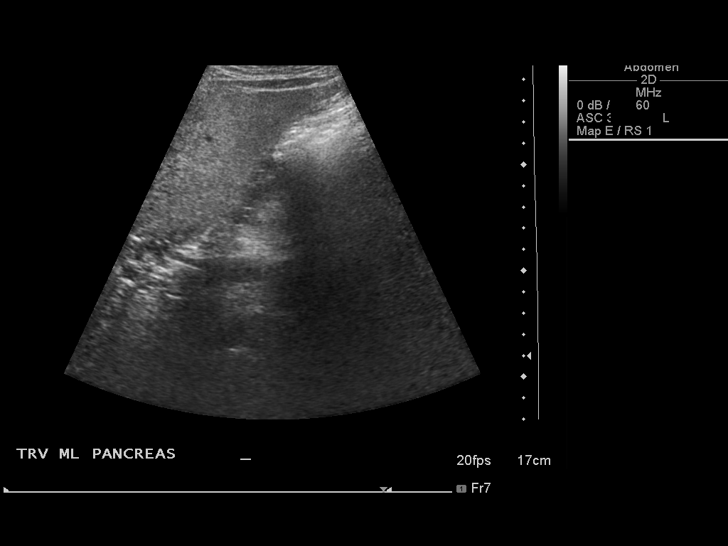
[im 37/56]
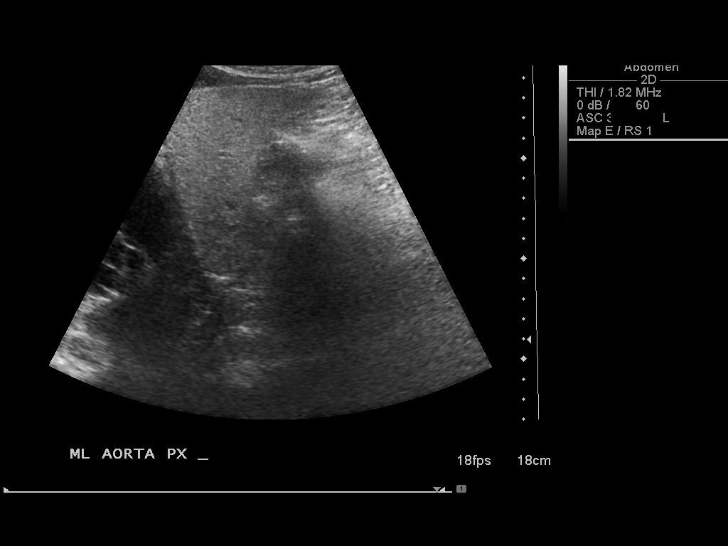
[im 42/56]
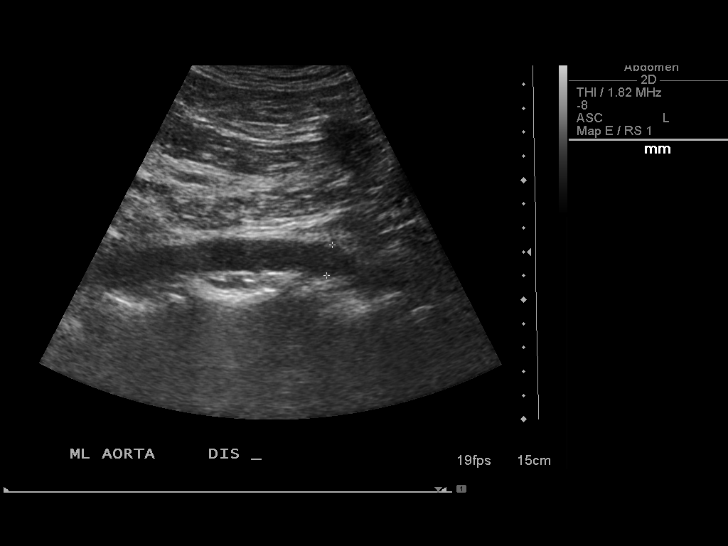
[im 46/56]
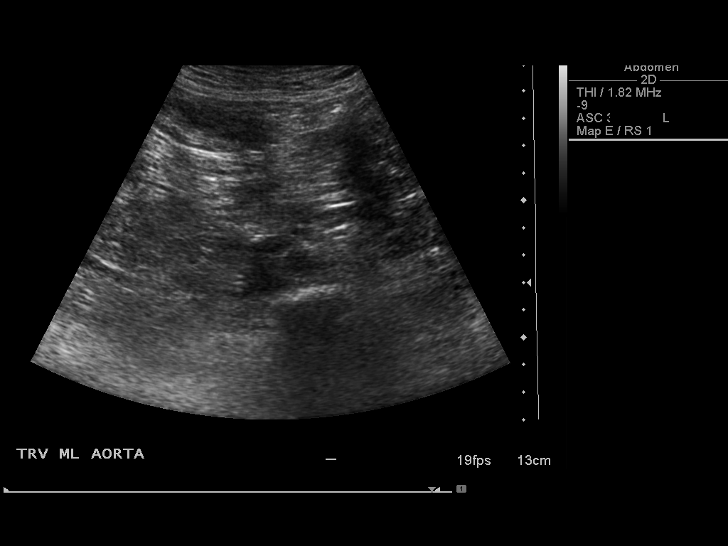
[im 51/56]
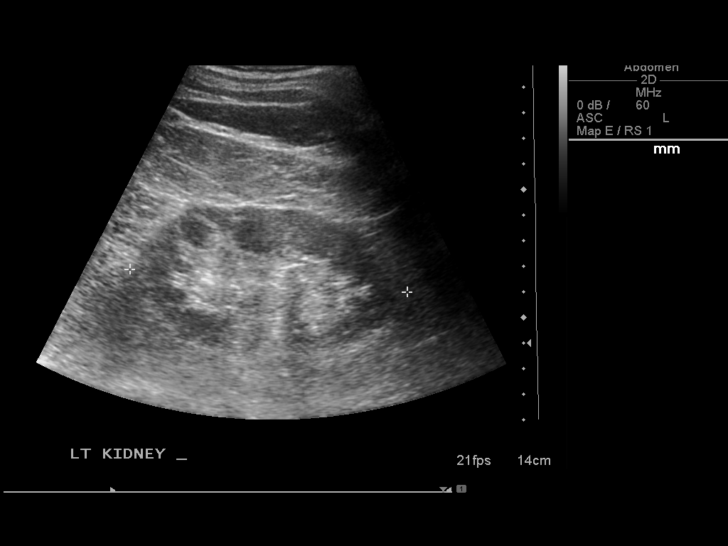
[im 56/56]
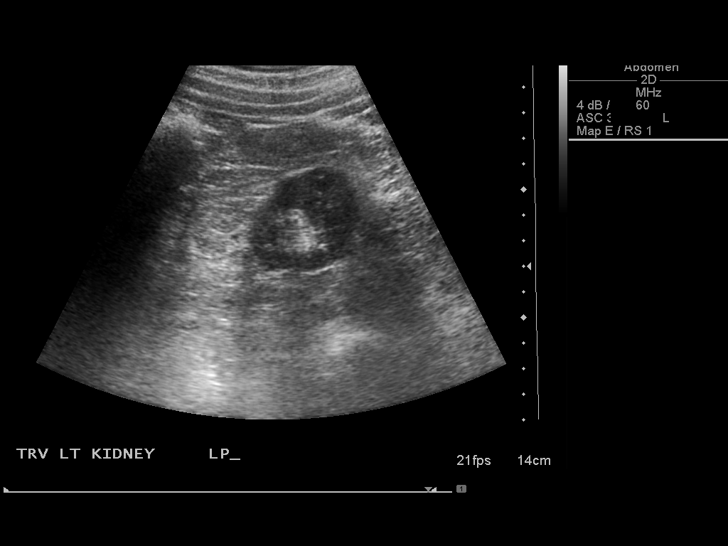

[14 of 25 positions shown; findings below may reference images not displayed]

FINDINGS: Gallbladder:  No gallstones, gallbladder wall thickening, or
pericholecystic fluid.

Common bile duct:  5 mm, normal.

Liver:  Hyperechoic with coarse echotexture when compared to the
adjacent right kidney, compatible with hepatic steatosis.

IVC:  Visualized portions are normal.  Intrahepatic IVC not seen.

Pancreas:  Obscured by overlying bowel gas.

Spleen:  11.3 cm.  Normal echotexture.

Right Kidney:  10.7 cm. Normal echotexture.  Normal central sinus
echo complex.  No calculi or hydronephrosis.

Left Kidney:  10.9 cm. Normal echotexture.  Normal central sinus
echo complex.  No calculi or hydronephrosis.

Abdominal aorta:  Proximal aorta partially obscured by overlying
bowel gas.  No aneurysm in the visualized portions.
IMPRESSION: Hyperechoic liver compatible with hepatic steatosis.  No
sonographic evidence of cholelithiasis or cholecystitis.

## 2013-12-02 DIAGNOSIS — A048 Other specified bacterial intestinal infections: Secondary | ICD-10-CM | POA: Diagnosis not present

## 2013-12-02 DIAGNOSIS — I10 Essential (primary) hypertension: Secondary | ICD-10-CM | POA: Diagnosis not present

## 2013-12-02 DIAGNOSIS — IMO0001 Reserved for inherently not codable concepts without codable children: Secondary | ICD-10-CM | POA: Diagnosis not present

## 2014-01-13 DIAGNOSIS — H2510 Age-related nuclear cataract, unspecified eye: Secondary | ICD-10-CM | POA: Diagnosis not present

## 2014-01-13 DIAGNOSIS — E119 Type 2 diabetes mellitus without complications: Secondary | ICD-10-CM | POA: Diagnosis not present

## 2014-01-17 DIAGNOSIS — IMO0001 Reserved for inherently not codable concepts without codable children: Secondary | ICD-10-CM | POA: Diagnosis not present

## 2014-01-17 DIAGNOSIS — Z1331 Encounter for screening for depression: Secondary | ICD-10-CM | POA: Diagnosis not present

## 2014-01-17 DIAGNOSIS — Z0000 Encounter for general adult medical examination without abnormal findings: Secondary | ICD-10-CM | POA: Diagnosis not present

## 2014-01-17 DIAGNOSIS — E785 Hyperlipidemia, unspecified: Secondary | ICD-10-CM | POA: Diagnosis not present

## 2014-01-17 DIAGNOSIS — R1013 Epigastric pain: Secondary | ICD-10-CM | POA: Diagnosis not present

## 2014-01-17 DIAGNOSIS — Z125 Encounter for screening for malignant neoplasm of prostate: Secondary | ICD-10-CM | POA: Diagnosis not present

## 2014-01-17 DIAGNOSIS — Z23 Encounter for immunization: Secondary | ICD-10-CM | POA: Diagnosis not present

## 2014-01-17 DIAGNOSIS — I10 Essential (primary) hypertension: Secondary | ICD-10-CM | POA: Diagnosis not present

## 2014-05-19 DIAGNOSIS — R109 Unspecified abdominal pain: Secondary | ICD-10-CM | POA: Diagnosis not present

## 2014-05-19 DIAGNOSIS — E780 Pure hypercholesterolemia: Secondary | ICD-10-CM | POA: Diagnosis not present

## 2014-05-19 DIAGNOSIS — G40909 Epilepsy, unspecified, not intractable, without status epilepticus: Secondary | ICD-10-CM | POA: Diagnosis not present

## 2014-05-19 DIAGNOSIS — E1122 Type 2 diabetes mellitus with diabetic chronic kidney disease: Secondary | ICD-10-CM | POA: Diagnosis not present

## 2014-05-19 DIAGNOSIS — I129 Hypertensive chronic kidney disease with stage 1 through stage 4 chronic kidney disease, or unspecified chronic kidney disease: Secondary | ICD-10-CM | POA: Diagnosis not present

## 2014-05-19 DIAGNOSIS — N183 Chronic kidney disease, stage 3 (moderate): Secondary | ICD-10-CM | POA: Diagnosis not present

## 2014-05-19 DIAGNOSIS — L719 Rosacea, unspecified: Secondary | ICD-10-CM | POA: Diagnosis not present

## 2014-05-19 DIAGNOSIS — K760 Fatty (change of) liver, not elsewhere classified: Secondary | ICD-10-CM | POA: Diagnosis not present

## 2014-05-20 DIAGNOSIS — E1122 Type 2 diabetes mellitus with diabetic chronic kidney disease: Secondary | ICD-10-CM | POA: Diagnosis not present

## 2014-05-20 DIAGNOSIS — E1165 Type 2 diabetes mellitus with hyperglycemia: Secondary | ICD-10-CM | POA: Diagnosis not present

## 2014-07-04 ENCOUNTER — Other Ambulatory Visit: Payer: Self-pay

## 2014-07-04 ENCOUNTER — Encounter (HOSPITAL_COMMUNITY): Payer: Self-pay | Admitting: Emergency Medicine

## 2014-07-04 ENCOUNTER — Inpatient Hospital Stay (HOSPITAL_COMMUNITY)
Admission: EM | Admit: 2014-07-04 | Discharge: 2014-07-07 | DRG: 247 | Disposition: A | Discharge status: Home / Self Care | Payer: Medicare Other | Attending: Cardiology | Admitting: Cardiology

## 2014-07-04 ENCOUNTER — Encounter (HOSPITAL_COMMUNITY)
Admission: EM | Disposition: A | Discharge status: Home / Self Care | Payer: Medicare Other | Source: Home / Self Care | Attending: Cardiology

## 2014-07-04 DIAGNOSIS — I213 ST elevation (STEMI) myocardial infarction of unspecified site: Secondary | ICD-10-CM

## 2014-07-04 DIAGNOSIS — N179 Acute kidney failure, unspecified: Secondary | ICD-10-CM | POA: Diagnosis not present

## 2014-07-04 DIAGNOSIS — E119 Type 2 diabetes mellitus without complications: Secondary | ICD-10-CM

## 2014-07-04 DIAGNOSIS — I2129 ST elevation (STEMI) myocardial infarction involving other sites: Secondary | ICD-10-CM | POA: Diagnosis present

## 2014-07-04 DIAGNOSIS — N183 Chronic kidney disease, stage 3 (moderate): Secondary | ICD-10-CM | POA: Diagnosis present

## 2014-07-04 DIAGNOSIS — I25119 Atherosclerotic heart disease of native coronary artery with unspecified angina pectoris: Secondary | ICD-10-CM | POA: Diagnosis present

## 2014-07-04 DIAGNOSIS — R079 Chest pain, unspecified: Secondary | ICD-10-CM | POA: Diagnosis not present

## 2014-07-04 DIAGNOSIS — I2510 Atherosclerotic heart disease of native coronary artery without angina pectoris: Secondary | ICD-10-CM | POA: Diagnosis not present

## 2014-07-04 DIAGNOSIS — E1169 Type 2 diabetes mellitus with other specified complication: Secondary | ICD-10-CM | POA: Diagnosis present

## 2014-07-04 DIAGNOSIS — I129 Hypertensive chronic kidney disease with stage 1 through stage 4 chronic kidney disease, or unspecified chronic kidney disease: Secondary | ICD-10-CM | POA: Diagnosis present

## 2014-07-04 DIAGNOSIS — Z9861 Coronary angioplasty status: Secondary | ICD-10-CM

## 2014-07-04 DIAGNOSIS — I10 Essential (primary) hypertension: Secondary | ICD-10-CM | POA: Diagnosis not present

## 2014-07-04 DIAGNOSIS — I2121 ST elevation (STEMI) myocardial infarction involving left circumflex coronary artery: Secondary | ICD-10-CM

## 2014-07-04 DIAGNOSIS — E785 Hyperlipidemia, unspecified: Secondary | ICD-10-CM | POA: Diagnosis present

## 2014-07-04 DIAGNOSIS — N189 Chronic kidney disease, unspecified: Secondary | ICD-10-CM | POA: Diagnosis not present

## 2014-07-04 DIAGNOSIS — E118 Type 2 diabetes mellitus with unspecified complications: Secondary | ICD-10-CM

## 2014-07-04 DIAGNOSIS — I25118 Atherosclerotic heart disease of native coronary artery with other forms of angina pectoris: Secondary | ICD-10-CM | POA: Diagnosis not present

## 2014-07-04 HISTORY — DX: Chronic kidney disease, stage 3 (moderate): N18.3

## 2014-07-04 HISTORY — PX: LEFT HEART CATHETERIZATION WITH CORONARY ANGIOGRAM: SHX5451

## 2014-07-04 HISTORY — PX: PERCUTANEOUS CORONARY STENT INTERVENTION (PCI-S): SHX5485

## 2014-07-04 HISTORY — DX: Atherosclerotic heart disease of native coronary artery without angina pectoris: I25.10

## 2014-07-04 HISTORY — DX: Chronic kidney disease, stage 3 unspecified: N18.30

## 2014-07-04 LAB — COMPREHENSIVE METABOLIC PANEL
ALT: 18 U/L (ref 0–53)
AST: 26 U/L (ref 0–37)
Albumin: 4 g/dL (ref 3.5–5.2)
Alkaline Phosphatase: 73 U/L (ref 39–117)
Anion gap: 10 (ref 5–15)
BUN: 28 mg/dL — ABNORMAL HIGH (ref 6–23)
CO2: 25 mmol/L (ref 19–32)
Calcium: 9.3 mg/dL (ref 8.4–10.5)
Chloride: 107 mEq/L (ref 96–112)
Creatinine, Ser: 1.71 mg/dL — ABNORMAL HIGH (ref 0.50–1.35)
GFR calc Af Amer: 45 mL/min — ABNORMAL LOW (ref >90)
GFR calc non Af Amer: 38 mL/min — ABNORMAL LOW (ref >90)
Glucose, Bld: 124 mg/dL — ABNORMAL HIGH (ref 70–99)
Potassium: 4.1 mmol/L (ref 3.5–5.1)
Sodium: 142 mmol/L (ref 135–145)
Total Bilirubin: 0.7 mg/dL (ref 0.3–1.2)
Total Protein: 6.8 g/dL (ref 6.0–8.3)

## 2014-07-04 LAB — POCT I-STAT TROPONIN I: Troponin i, poc: 0.23 ng/mL (ref 0.00–0.08)

## 2014-07-04 LAB — APTT: aPTT: 29 seconds (ref 24–37)

## 2014-07-04 LAB — CBC
HCT: 39.7 % (ref 39.0–52.0)
Hemoglobin: 13.7 g/dL (ref 13.0–17.0)
MCH: 30.8 pg (ref 26.0–34.0)
MCHC: 34.5 g/dL (ref 30.0–36.0)
MCV: 89.2 fL (ref 78.0–100.0)
Platelets: 153 10*3/uL (ref 150–400)
RBC: 4.45 MIL/uL (ref 4.22–5.81)
RDW: 12.9 % (ref 11.5–15.5)
WBC: 4.7 10*3/uL (ref 4.0–10.5)

## 2014-07-04 LAB — GLUCOSE, CAPILLARY
Glucose-Capillary: 107 mg/dL — ABNORMAL HIGH (ref 70–99)
Glucose-Capillary: 157 mg/dL — ABNORMAL HIGH (ref 70–99)
Glucose-Capillary: 127 mg/dL — ABNORMAL HIGH (ref 70–99)

## 2014-07-04 LAB — TROPONIN I
Troponin I: 0.65 ng/mL (ref <0.031)
Troponin I: 2.72 ng/mL (ref <0.031)
Troponin I: 3.99 ng/mL (ref <0.031)

## 2014-07-04 LAB — BRAIN NATRIURETIC PEPTIDE: B Natriuretic Peptide: 32.1 pg/mL (ref 0.0–100.0)

## 2014-07-04 LAB — MRSA PCR SCREENING: MRSA by PCR: NEGATIVE

## 2014-07-04 LAB — POCT ACTIVATED CLOTTING TIME: Activated Clotting Time: 583 seconds

## 2014-07-04 LAB — PROTIME-INR
INR: 0.94 (ref 0.00–1.49)
Prothrombin Time: 12.7 seconds (ref 11.6–15.2)

## 2014-07-04 SURGERY — LEFT HEART CATHETERIZATION WITH CORONARY ANGIOGRAM
Anesthesia: LOCAL

## 2014-07-04 MED ORDER — HEPARIN SODIUM (PORCINE) 1000 UNIT/ML IJ SOLN
INTRAMUSCULAR | Status: AC
  Filled 2014-07-04: qty 1

## 2014-07-04 MED ORDER — NITROGLYCERIN 0.4 MG SL SUBL: 0.4000 mg | SUBLINGUAL_TABLET | SUBLINGUAL | Status: DC | PRN

## 2014-07-04 MED ORDER — HEPARIN SODIUM (PORCINE) 5000 UNIT/ML IJ SOLN
5000.0000 [IU] | Freq: Three times a day (TID) | INTRAMUSCULAR | Status: DC
  Administered 2014-07-04 – 2014-07-07 (×8): 5000 [IU] via SUBCUTANEOUS
  Filled 2014-07-04 (×12): qty 1

## 2014-07-04 MED ORDER — ASPIRIN 81 MG PO CHEW
81.0000 mg | CHEWABLE_TABLET | Freq: Every day | ORAL | Status: DC
  Administered 2014-07-05 – 2014-07-07 (×3): 81 mg via ORAL
  Filled 2014-07-04 (×3): qty 1

## 2014-07-04 MED ORDER — LIDOCAINE HCL (PF) 1 % IJ SOLN
INTRAMUSCULAR | Status: AC
  Filled 2014-07-04: qty 30

## 2014-07-04 MED ORDER — NITROGLYCERIN 1 MG/10 ML FOR IR/CATH LAB
INTRA_ARTERIAL | Status: AC
  Filled 2014-07-04: qty 10

## 2014-07-04 MED ORDER — ONDANSETRON HCL 4 MG/2ML IJ SOLN: 4.0000 mg | Freq: Four times a day (QID) | INTRAMUSCULAR | Status: DC | PRN

## 2014-07-04 MED ORDER — HEPARIN (PORCINE) IN NACL 2-0.9 UNIT/ML-% IJ SOLN
INTRAMUSCULAR | Status: AC
  Filled 2014-07-04: qty 1500

## 2014-07-04 MED ORDER — SODIUM CHLORIDE 0.9 % IV SOLN
1.0000 mL/kg/h | INTRAVENOUS | Status: AC
Start: 2014-07-04 — End: 2014-07-04
  Administered 2014-07-04: 1 mL/kg/h via INTRAVENOUS

## 2014-07-04 MED ORDER — PERFLUTREN LIPID MICROSPHERE
1.0000 mL | INTRAVENOUS | Status: AC | PRN
  Filled 2014-07-04: qty 10

## 2014-07-04 MED ORDER — SODIUM CHLORIDE 0.9 % IV SOLN: 250.0000 mL | INTRAVENOUS | Status: DC | PRN

## 2014-07-04 MED ORDER — GLIMEPIRIDE 4 MG PO TABS
4.0000 mg | ORAL_TABLET | Freq: Every day | ORAL | Status: DC
  Administered 2014-07-05 – 2014-07-07 (×3): 4 mg via ORAL
  Filled 2014-07-04 (×4): qty 1

## 2014-07-04 MED ORDER — ALUM & MAG HYDROXIDE-SIMETH 200-200-20 MG/5ML PO SUSP
30.0000 mL | Freq: Four times a day (QID) | ORAL | Status: DC | PRN
  Administered 2014-07-04: 30 mL via ORAL
  Filled 2014-07-04: qty 30

## 2014-07-04 MED ORDER — SODIUM CHLORIDE 0.9 % IJ SOLN: 3.0000 mL | INTRAMUSCULAR | Status: DC | PRN

## 2014-07-04 MED ORDER — ALPRAZOLAM 0.25 MG PO TABS
0.2500 mg | ORAL_TABLET | Freq: Two times a day (BID) | ORAL | Status: DC | PRN
  Administered 2014-07-04 – 2014-07-06 (×2): 0.25 mg via ORAL
  Filled 2014-07-04 (×2): qty 1

## 2014-07-04 MED ORDER — TICAGRELOR 90 MG PO TABS
90.0000 mg | ORAL_TABLET | Freq: Two times a day (BID) | ORAL | Status: DC
  Administered 2014-07-04 – 2014-07-07 (×6): 90 mg via ORAL
  Filled 2014-07-04 (×7): qty 1

## 2014-07-04 MED ORDER — BIVALIRUDIN 250 MG IV SOLR
INTRAVENOUS | Status: AC
  Filled 2014-07-04: qty 250

## 2014-07-04 MED ORDER — SODIUM CHLORIDE 0.9 % IV SOLN: INTRAVENOUS | Status: DC

## 2014-07-04 MED ORDER — ACETAMINOPHEN 325 MG PO TABS
650.0000 mg | ORAL_TABLET | ORAL | Status: DC | PRN
  Administered 2014-07-06: 650 mg via ORAL
  Filled 2014-07-04: qty 2

## 2014-07-04 MED ORDER — SODIUM CHLORIDE 0.9 % IJ SOLN
3.0000 mL | Freq: Two times a day (BID) | INTRAMUSCULAR | Status: DC
  Administered 2014-07-04: 3 mL via INTRAVENOUS
  Administered 2014-07-04: 15:00:00 via INTRAVENOUS
  Administered 2014-07-05 – 2014-07-06 (×3): 3 mL via INTRAVENOUS

## 2014-07-04 MED ORDER — INSULIN ASPART 100 UNIT/ML ~~LOC~~ SOLN
0.0000 [IU] | Freq: Three times a day (TID) | SUBCUTANEOUS | Status: DC
Start: 2014-07-04 — End: 2014-07-07
  Administered 2014-07-04 – 2014-07-05 (×2): 3 [IU] via SUBCUTANEOUS
  Administered 2014-07-05: 5 [IU] via SUBCUTANEOUS
  Administered 2014-07-06: 2 [IU] via SUBCUTANEOUS
  Administered 2014-07-06 – 2014-07-07 (×3): 3 [IU] via SUBCUTANEOUS

## 2014-07-04 MED ORDER — HEPARIN SODIUM (PORCINE) 5000 UNIT/ML IJ SOLN: 60.0000 [IU]/kg | INTRAMUSCULAR | Status: DC

## 2014-07-04 MED ORDER — ATORVASTATIN CALCIUM 80 MG PO TABS
80.0000 mg | ORAL_TABLET | Freq: Every day | ORAL | Status: DC
  Administered 2014-07-04 – 2014-07-06 (×3): 80 mg via ORAL
  Filled 2014-07-04 (×4): qty 1

## 2014-07-04 MED ORDER — TICAGRELOR 90 MG PO TABS
ORAL_TABLET | ORAL | Status: AC
  Administered 2014-07-05: 90 mg via ORAL
  Filled 2014-07-04: qty 2

## 2014-07-04 MED ORDER — PERFLUTREN LIPID MICROSPHERE
INTRAVENOUS | Status: AC
  Administered 2014-07-04: 2 mL
  Filled 2014-07-04: qty 10

## 2014-07-04 NOTE — Progress Notes (Signed)
Chaplain responded to code stemi. By the time chaplain reached the ED pt already taken to cath lab. Family was with him. Page chaplain as needed.    07/04/14 0900  Clinical Encounter Type  Visited With Health care provider  Visit Type Code;Initial  [name], Mayer MaskerCourtney F, Chaplain 07/04/2014 9:05 AM

## 2014-07-04 NOTE — CV Procedure (Signed)
Cardiac Catheterization Procedure Note  Name: Scott Wilkins MRN: [id number] DOB: May 11, 1941  Procedure: Left Heart Cath, Selective Coronary Angiography,  PTCA and stenting of the LCx, PTCA of the first diagonal.  Indication: 73 you WM with acute lateral STEMI.  Procedural Details:  The right wrist was prepped, draped, and anesthetized with 1% lidocaine. Using the modified Seldinger technique, a 6 French slender sheath was introduced into the right radial artery. 3 mg of verapamil was administered through the sheath, weight-based unfractionated heparin was administered intravenously. Standard Judkins catheters were used for selective coronary angiography and left ventricular pressures. Catheter exchanges were performed over an exchange length guidewire.  PROCEDURAL FINDINGS Hemodynamics: AO 101/63 mean 80 mm Hg LV 101/11 mm Hg   Coronary angiography: Coronary dominance: right  Left mainstem: Normal  Left anterior descending (LAD): The LAD is severely diseased in the distal vessel up to 90%. The artery is very small in caliber distally. The first diagonal has a 95% stenosis proximally.   Left circumflex (LCx): The LCx gives rise to a single large OM. There is a 90% stenosis in the proximal vessel.  Right coronary artery (RCA): Dominant vessel. There are 20% irregularities in the mid vessel. There is a 50% stenosis in the mid PDA.  Left ventriculography: Not done  PCI Note:  Following the diagnostic procedure, the decision was made to proceed with PCI. It was unclear at this point whether the LCx or the diagonal was the culprit. He was pain free at this point and had TIMI 3 flow. We began with the LCx.   Weight-based bivalirudin was given for anticoagulation. Brilinta 180 mg was given orally. Once a therapeutic ACT was achieved, a 6 [place] XBLAD 3.5 guide catheter was inserted.  A prowater coronary guidewire was used to cross the lesion.  The lesion was predilated with a 2.5 mm  balloon.  The lesion was then stented with a 3.5 x 16 mm Promus stent.  The stent was postdilated with a 3.5 mm noncompliant balloon.  Following PCI, there was 0% residual stenosis and TIMI-3 flow. Final angiography confirmed an excellent result.  We next addressed the lesion in the first diagonal. This lesion extended to the ostium. The lesion was crossed with a prowater wire. The lesion was dilated with a 2.5 mm balloon. This yielded an excellent result with less than 10% residual stenosis and TIMI 3 flow. There was significant back and forth motion of the balloon with the cardiac cycle. I elected not to stent the lesion given concern over the ability to precisely place the stent at the ostium and concern over compromising the LAD. The patient tolerated the procedure well. There were no immediate procedural complications. A TR band was used for radial hemostasis. The patient was transferred to the post catheterization recovery area for further monitoring.  PCI Data: Vessel - LCX/Segment - proximal Percent Stenosis (pre)  90% TIMI-flow 3 Stent 3.5 x 16 mm Promus Percent Stenosis (post) 0% TIMI-flow (post) 3  Vessel #2- first diagonal Percent stenosis (pre)- 95% TIMI flow (pre) PTCA only Percent stenosis (post)- <10% TIMI flow (post)- 3  Final Conclusions:   1. Severe 2 vessel obstructive CAD 2. Successful stenting of the proximal LCx with DES. 3. Successful PTCA (balloon only) of the first diagonal    Recommendations:  Recommend DAPT for one year. Aggressive risk factor modification. Of note, Ecg post procedure still shows ST elevation in the lateral leads but the patient is pain free and has a  good angiographic results. Will assess LV function with Echo. Aggressive risk factor modification. Will treat residual disease in the distal LAD medically.  Nikkita Adeyemi [place]Jordan, MDFACC 07/04/2014, 9:47 AM

## 2014-07-04 NOTE — Progress Notes (Signed)
Echocardiogram 2D Echocardiogram has been performed.  Kebra Lowrimore 07/04/2014, 12:13 PM

## 2014-07-04 NOTE — ED Provider Notes (Signed)
CSN: [id number]     Arrival date & time 07/04/14  [id number] History   First MD Initiated Contact with Patient 07/04/14 [phone]     Chief Complaint  Patient presents with  . Chest Pain     Patient is a 74 y.o. male presenting with chest pain. The history is provided by the patient and the EMS personnel. No language interpreter was used.  Chest Pain  Patient presents via EMS for evaluation of chest pain. He states that while he was shoveling snow he developed central sharp chest pain. His symptoms were associated with diaphoresis and shortness of breath. He feels generalized weakness. He had a more mild episode a few days ago when he was chopping wood that resolved when he stopped the activity. his symptoms did not improve when he stopped the activity today and EMS was called. Prior to ED arrival he received 324 of aspirin as well as 2 sublingual ntg. The nitroglycerin did improve his pain but is starting to return again. Symptoms are severe, waxing and waning, worsening. He has a history of diabetes and was recently taken off his blood pressure medication.   Past Medical History  Diagnosis Date  . Hypertension   . Hyperlipidemia   . Rosacea   . Seizures     h/o - none in years  . Diabetes mellitus    Past Surgical History  Procedure Laterality Date  . Craniotomy  1948    left craniectomy after head trauma  . Rib fracture surgery  1976    ORIF after frcture due to trauma   Family History  Problem Relation Age of Onset  . Heart disease Mother     CAD  . Heart disease Father     CAD  . Heart disease Brother     CAD  . Cancer Neg Hx   . Diabetes Neg Hx   . COPD Neg Hx   . Heart disease Brother     CAD   History  Substance Use Topics  . Smoking status: Never Smoker   . Smokeless tobacco: Never Used  . Alcohol Use: No    Review of Systems  Cardiovascular: Positive for chest pain.      Allergies  Review of patient's allergies indicates no known allergies.  Home Medications    Prior to Admission medications   Medication Sig Start Date End Date Taking? Authorizing Provider  glimepiride (AMARYL) 4 MG tablet TAKE ONE-HALF TABLET BY MOUTH TWICE DAILY 10/04/13   Newt LukesValerie A Leschber, MD  lisinopril-hydrochlorothiazide (PRINZIDE,ZESTORETIC) 10-12.5 MG per tablet Take 1 tablet by mouth daily. 04/23/13   Jacques NavyMichael E Norins, MD  metFORMIN (GLUCOPHAGE) 1000 MG tablet TAKE ONE TABLET BY MOUTH TWICE DAILY WITH MEALS 10/08/13   Corwin LevinsJames W John, MD  omeprazole (PRILOSEC) 40 MG capsule Take 1 capsule (40 mg total) by mouth daily. 02/15/12 02/14/13  Jacques NavyMichael E Norins, MD  tadalafil (CIALIS) 20 MG tablet Take 20 mg by mouth every other day as needed. For erectile dysfunction 07/28/11 08/27/11  Jacques NavyMichael E Norins, MD   BP 122/73 mmHg  Pulse 52  Resp 19  Ht 5\' 5"  (1.651 m)  Wt 160 lb (72.576 kg)  BMI 26.63 kg/m2  SpO2 100% Physical Exam  Constitutional: He is oriented to person, place, and time. He appears well-developed and well-nourished.  HENT:  Head: Normocephalic and atraumatic.  Cardiovascular: Normal rate and regular rhythm.   No murmur heard. 2+ radial and DP pulses bilaterally  Pulmonary/Chest: Effort normal and breath sounds  normal. No respiratory distress.  Abdominal: Soft. There is no rebound and no guarding.  Mild epigastric tenderness  Musculoskeletal: He exhibits no edema or tenderness.  Neurological: He is alert and oriented to person, place, and time.  Skin: Skin is warm.  Pale, diaphoretic  Psychiatric: He has a normal mood and affect. His behavior is normal.  Nursing note and vitals reviewed.   ED Course  Procedures (including critical care time) Labs Review Labs Reviewed  APTT  CBC  COMPREHENSIVE METABOLIC PANEL  PROTIME-INR  BRAIN NATRIURETIC PEPTIDE  I-STAT TROPOININ, ED    Imaging Review No results found.   EKG Interpretation   Date/Time:  Friday July 04 2014 08:33:53 EST Ventricular Rate:  53 PR Interval:  156 QRS Duration: 90 QT Interval:   419 QTC Calculation: 393 R Axis:   55 Text Interpretation:  Sinus rhythm Lateral infarct, acute (LAD) Baseline  wander in lead(s) V2 V3 V5 Confirmed by Lincoln Brigham [phone]) on 07/04/2014  9:11:57 AM      MDM   Final diagnoses:  Acute ST elevation myocardial infarction (STEMI), unspecified artery    Patient here for evaluation of chest pain. Patient is ill-appearing on exam with diaphoresis and pallor. EKG with changes concerning for ST elevation MI. STEMI activated and patient transferred rectally to the Cath Lab for intervention.    Tilden Fossa, MD 07/04/14 1137

## 2014-07-04 NOTE — H&P (Signed)
Patient ID: Scott Wilkins MRN: [id number], DOB/AGE: Apr 10, 1941   Admit date: 07/04/2014   Primary Physician: [name], MD Primary Cardiologist: new - seen by P. [place], MD   Pt. Profile:  74 y/o male w/o prior cardiac hx who presented to the [facility] ED today with c/p and lateral STEMI.  Problem List  Past Medical History  Diagnosis Date  . Hypertension   . Hyperlipidemia   . Rosacea   . Seizures     h/o - none in years  . Diabetes mellitus   . CAD (coronary artery disease)     a. 06/2014 Lateral STEMI/PCI: LM nl, LAD 90d, small, D1 95 (PTCA only), LCX 90p (3.5 x 16 Promus DES), RCA dominant, 17m, RPDA 55m.  . CKD (chronic kidney disease), stage III     Past Surgical History  Procedure Laterality Date  . Craniotomy  1948    left craniectomy after head trauma  . Rib fracture surgery  1976    ORIF after frcture due to trauma     Allergies  No Known Allergies  HPI  74 y/o male w/o a prior cardiac hx, though he does have a h/o DM, HTN, and HL.  He was in his usual state of health until about 2 days ago when he was chopping some wood and noted sscp with dyspnea.  This resolved spontaneously after a few mins.  This AM, shortly after shoveling some snow, he went back into his house and developed severe sscp.  He asked his wife to call EMS and following their arrival, he was taken to [facility] ED.  There, ECG showed 1mm lateral ST elevation.  A Code STEMI was activated and he was taken to the cath lab.  Cath revealed severe LCX and diagonal dzs and both were treated with PCI (DES LCX, PTCA Diag).  Pt tolerated procedure well and was tx to CCU where he remains pain free.  Home Medications  Prior to Admission medications   Medication Sig Start Date End Date Taking? Authorizing Provider  glimepiride (AMARYL) 4 MG tablet TAKE ONE-HALF TABLET BY MOUTH TWICE DAILY 10/04/13   Newt Lukes, MD  lisinopril-hydrochlorothiazide (PRINZIDE,ZESTORETIC) 10-12.5 MG per tablet Take  1 tablet by mouth daily. 04/23/13   Jacques Navy, MD  metFORMIN (GLUCOPHAGE) 1000 MG tablet TAKE ONE TABLET BY MOUTH TWICE DAILY WITH MEALS 10/08/13   Corwin Levins, MD  omeprazole (PRILOSEC) 40 MG capsule Take 1 capsule (40 mg total) by mouth daily. 02/15/12 02/14/13  Jacques Navy, MD  tadalafil (CIALIS) 20 MG tablet Take 20 mg by mouth every other day as needed. For erectile dysfunction 07/28/11 08/27/11  Jacques Navy, MD    Family History  Family History  Problem Relation Age of Onset  . Heart disease Mother     CAD  . Heart disease Father     CAD - died @ 25 of MI  . Heart disease Brother     CAD - died @ 70 of MI  . Cancer Neg Hx   . Diabetes Neg Hx   . COPD Neg Hx   . Heart disease Brother     CAD    Social History  History   Social History  . Marital Status: Married    Spouse Name: N/A    Number of Children: 3  . Years of Education: 8   Occupational History  . truck driver     retired   Chief Executive Officer History Main  Topics  . Smoking status: Never Smoker   . Smokeless tobacco: Never Used  . Alcohol Use: No  . Drug Use: No  . Sexual Activity: No   Other Topics Concern  . Not on file   Social History Narrative   Finished 8th grade. Married '64. 3 dtrs. 5 Grandchildren. Work - retired Naval architecttruck driver. [place].     Review of Systems General:  No chills, fever, night sweats or weight changes.  Cardiovascular:  +++ chest pain earlier, no dyspnea on exertion, edema, orthopnea, palpitations, paroxysmal nocturnal dyspnea. Dermatological: No rash, lesions/masses Respiratory: No cough, dyspnea Urologic: No hematuria, dysuria Abdominal:   No nausea, vomiting, diarrhea, bright red blood per rectum, melena, or hematemesis Neurologic:  No visual changes, wkns, changes in mental status. All other systems reviewed and are otherwise negative except as noted above.  Physical Exam  Blood pressure 119/66, pulse 56, resp. rate 18, height 5\' 5"  (1.651 m),  weight 156 lb 12 oz (71.1 kg), SpO2 99 %.  General: Pleasant, NAD Psych: Normal affect. Neuro: Alert and oriented X 3. Moves all extremities spontaneously. HEENT: Normal  Neck: Supple without bruits or JVD. Lungs:  Resp regular and unlabored, CTA. Heart: RRR no s3, s4, or murmurs. Abdomen: Soft, non-tender, non-distended, BS + x 4.  Extremities: No clubbing, cyanosis or edema. DP/PT/Radials 2+ and equal bilaterally. R wrist cath site w/o bleeding/bruit/hematoma.  Labs  Troponin [facility](Point of Care Test)  Recent Labs  07/04/14 0859  TROPIPOC 0.23*    Lab Results  Component Value Date   WBC 4.7 07/04/2014   HGB 13.7 07/04/2014   HCT 39.7 07/04/2014   MCV 89.2 07/04/2014   PLT 153 07/04/2014     Recent Labs Lab 07/04/14 0846  NA 142  K 4.1  CL 107  CO2 25  BUN 28*  CREATININE 1.71*  CALCIUM 9.3  PROT 6.8  BILITOT 0.7  ALKPHOS 73  ALT 18  AST 26  GLUCOSE 124*   Lab Results  Component Value Date   CHOL 195 08/28/2012   HDL 47.70 08/28/2012   LDLCALC 113* 08/28/2012   TRIG 171.0* 08/28/2012   Radiology/Studies  No results found.  ECG  Sinus brady, 54, lateral ST elevation (~ 1 mm)  ASSESSMENT AND PLAN  1.  Acute lateral STEMI/CAD: s/p cath revealing severe LCX and diagonal dzs.  The LCX was treated with a DES while the Diag was successfully treated with PTCA.  No residual c/p.  Admit to CCU. Cont asa, brilinta, and high potency statin. No bb in setting of bradycardia. Echo ordered.  Eventual cardiac rehab.  He does not think that he has pharmacy coverage and so I have filled out ppwk for [place] assistance program and gave to pts dtr today.  2.  HTN:  Stable.  He says that his PCP had prev taken him off of acei and is not currently on anything @ home.  3.  HL:  Check lipids/lft's.  Cont high potency statin.  4.  DM:  Hold metformin.  Add SSI.  5.  CKD III:  Creat 1.7.  This may be his baseline.  Hydrate and follow.  Signed, Nicolasa Duckinghristopher Sung Parodi, NP 07/04/2014,  11:35 AM

## 2014-07-04 NOTE — ED Notes (Signed)
Pt arrives from home via EMS,  reports sudden onset chest pain while shoveling snow, reports some chest discomfort 2 days ago while chopping wood.  Initial EKG normal, some elevations noted in repeat EKG on truck.  Pt took ASA at home, got 2 NTG on the truck, reports some relief from pain with NTG but quick return of pain.  Pt rates pain 5/10.  Pt appears diaphoretic.

## 2014-07-04 NOTE — ED Notes (Signed)
Pt transported to Cath Lab with Primary RN Marylene LandAngela. Pt on Zoll monitor. Family at bedside. MD Madilyn Hookees at bedside.

## 2014-07-04 NOTE — Care Management Note (Signed)
    Page 1 of 1   07/04/2014     11:06:29 AM CARE MANAGEMENT NOTE 07/04/2014  Patient:  Scott Wilkins   Account Number:  [id number]  Date Initiated:  07/04/2014  Documentation initiated by:  Scott Wilkins  Subjective/Objective Assessment:   adm w mi     Action/Plan:   lives w wife, pcp dr Scott Wilkins   Anticipated DC Date:     Anticipated DC Plan:  HOME/SELF CARE      DC Planning Services  CM consult  Medication Assistance      Choice offered to / List presented to:             Status of service:   Medicare Important Message given?   (If response is "NO", the following Medicare IM given date fields will be blank) Date Medicare IM given:   Medicare IM given by:   Date Additional Medicare IM given:   Additional Medicare IM given by:    Discharge Disposition:    Per UR Regulation:  Reviewed for med. necessity/level of care/duration of stay  If discussed at Long Length of Stay Meetings, dates discussed:    Comments:  1/22 1105 Scott Wilkins Scott Wilkins gave pt 30day free brilinta card. no medicare d plan. have placed pt assist form on shadow chart. da to help pt get proof of income.

## 2014-07-05 DIAGNOSIS — I2129 ST elevation (STEMI) myocardial infarction involving other sites: Principal | ICD-10-CM

## 2014-07-05 LAB — COMPREHENSIVE METABOLIC PANEL
ALT: 18 U/L (ref 0–53)
AST: 30 U/L (ref 0–37)
Albumin: 3.6 g/dL (ref 3.5–5.2)
Alkaline Phosphatase: 76 U/L (ref 39–117)
Anion gap: 8 (ref 5–15)
BUN: 32 mg/dL — ABNORMAL HIGH (ref 6–23)
CO2: 26 mmol/L (ref 19–32)
Calcium: 8.8 mg/dL (ref 8.4–10.5)
Chloride: 105 mmol/L (ref 96–112)
Creatinine, Ser: 2.08 mg/dL — ABNORMAL HIGH (ref 0.50–1.35)
GFR calc Af Amer: 35 mL/min — ABNORMAL LOW (ref >90)
GFR calc non Af Amer: 30 mL/min — ABNORMAL LOW (ref >90)
Glucose, Bld: 156 mg/dL — ABNORMAL HIGH (ref 70–99)
Potassium: 4 mmol/L (ref 3.5–5.1)
Sodium: 139 mmol/L (ref 135–145)
Total Bilirubin: 0.8 mg/dL (ref 0.3–1.2)
Total Protein: 6.1 g/dL (ref 6.0–8.3)

## 2014-07-05 LAB — LIPID PANEL
Cholesterol: 151 mg/dL (ref 0–200)
HDL: 45 mg/dL (ref >39)
LDL Cholesterol: 86 mg/dL (ref 0–99)
Total CHOL/HDL Ratio: 3.4 RATIO
Triglycerides: 100 mg/dL (ref <150)
VLDL: 20 mg/dL (ref 0–40)

## 2014-07-05 LAB — GLUCOSE, CAPILLARY
Glucose-Capillary: 102 mg/dL — ABNORMAL HIGH (ref 70–99)
Glucose-Capillary: 235 mg/dL — ABNORMAL HIGH (ref 70–99)
Glucose-Capillary: 157 mg/dL — ABNORMAL HIGH (ref 70–99)

## 2014-07-05 LAB — CBC
HCT: 37.4 % — ABNORMAL LOW (ref 39.0–52.0)
Hemoglobin: 12.5 g/dL — ABNORMAL LOW (ref 13.0–17.0)
MCH: 30.3 pg (ref 26.0–34.0)
MCHC: 33.4 g/dL (ref 30.0–36.0)
MCV: 90.6 fL (ref 78.0–100.0)
Platelets: 142 10*3/uL — ABNORMAL LOW (ref 150–400)
RBC: 4.13 MIL/uL — ABNORMAL LOW (ref 4.22–5.81)
RDW: 13 % (ref 11.5–15.5)
WBC: 5.9 10*3/uL (ref 4.0–10.5)

## 2014-07-05 MED ORDER — CARVEDILOL 3.125 MG PO TABS
3.1250 mg | ORAL_TABLET | Freq: Two times a day (BID) | ORAL | Status: DC
  Administered 2014-07-05 – 2014-07-07 (×4): 3.125 mg via ORAL
  Filled 2014-07-05 (×7): qty 1

## 2014-07-05 NOTE — Progress Notes (Signed)
CARDIAC REHAB PHASE I   PRE:  Rate/Rhythm: 60  BP:  Sitting: 110/65       MODE:  Ambulation: 700 ft   POST:  Rate/Rhythm: 80  BP:  Sitting: 134/74      11:45am-12:15pm Patient ambulated with x1 assist.  When patient was walking and turned toward me to talk, he tended to get a little off balance.  Otherwise the patient ambulated at a nice pace.  Patient educated with wife in the room.  Patient is interested in cardiac rehab at [facility].    Bland SpanBrady, Zhi Geier D, MS 07/05/2014 12:11 PM

## 2014-07-05 NOTE — Progress Notes (Signed)
Patient ID: Darnelle MaffucciRonald J Kaleta, male   DOB: 07-07-1940, 74 y.o.   MRN: [id number]    Subjective:   74 yo with acute lateral wall MI 07/03/14  DES to circumflex and POB to diagonal.  Residual distal LAD disease Rx medically  No chest pain wants to go home   Objective:  Filed Vitals:   07/05/14 0422 07/05/14 0500 07/05/14 0600 07/05/14 0800  BP: 154/67  124/41 142/85  Pulse: 64 68 55 61  Temp: 97.8 F (36.6 C)   97.7 F (36.5 C)  TempSrc: Oral   Oral  Resp:      Height:      Weight:  70.852 kg (156 lb 3.2 oz)    SpO2: 94% 97% 94% 98%    Intake/Output from previous day:  Intake/Output Summary (Last 24 hours) at 07/05/14 0908 Last data filed at 07/05/14 0800  Gross per 24 hour  Intake 875.25 ml  Output   1025 ml  Net -149.75 ml    Physical Exam: Affect appropriate Healthy:  appears stated age HEENT: normal Neck supple with no adenopathy JVP normal no bruits no thyromegaly Lungs clear with no wheezing and good diaphragmatic motion Heart:  S1/S2 no murmur, no rub, gallop or click PMI normal Abdomen: benighn, BS positve, no tenderness, no AAA no bruit.  No HSM or HJR Distal pulses intact with no bruits No edema Neuro non-focal Skin warm and dry No muscular weakness   Lab Results: Basic Metabolic Panel:  Recent Labs  96/29/5201/22/16 0846 07/05/14 0231  NA 142 139  K 4.1 4.0  CL 107 105  CO2 25 26  GLUCOSE 124* 156*  BUN 28* 32*  CREATININE 1.71* 2.08*  CALCIUM 9.3 8.8   Liver Function Tests:  Recent Labs  07/04/14 0846 07/05/14 0231  AST 26 30  ALT 18 18  ALKPHOS 73 76  BILITOT 0.7 0.8  PROT 6.8 6.1  ALBUMIN 4.0 3.6   CBC:  Recent Labs  07/04/14 0846 07/05/14 0231  WBC 4.7 5.9  HGB 13.7 12.5*  HCT 39.7 37.4*  MCV 89.2 90.6  PLT 153 142*   Cardiac Enzymes:  Recent Labs  07/04/14 1140 07/04/14 1710 07/04/14 2240  TROPONINI 0.65* 2.72* 3.99*   Fasting Lipid Panel:  Recent Labs  07/05/14 0231  CHOL 151  HDL 45  LDLCALC 86  TRIG 100    CHOLHDL 3.4    Imaging: No results found.  Cardiac Studies:  ECG: SR rate 60 mild residual ST elevation laterally evolving circ MI    Telemetry:  NSR no VT   Echo: Study Conclusions  1/22  - Left ventricle: Distal septal hypokinesis Definity used due to poor endocardial definition. The cavity size was normal. Systolic function was normal. The estimated ejection fraction was in the range of 50% to 55%. Wall motion was normal; there were no regional wall motion abnormalities. - Aortic valve: Calcified non coronary cusp. - Atrial septum: No defect or patent foramen ovale was identified.  Medications:   . aspirin  81 mg Oral Daily  . atorvastatin  80 mg Oral q1800  . glimepiride  4 mg Oral Q breakfast  . heparin  5,000 Units Subcutaneous 3 times per day  . insulin aspart  0-15 Units Subcutaneous TID WC  . sodium chloride  3 mL Intravenous Q12H  . ticagrelor  90 mg Oral BID     . sodium chloride      Assessment/Plan:  Lateral MI:  Start low dose beta blocker.  Daughter will look into cost of ticagrelor May be d/c on effient or plavix.  She has patient assistance forms Transfer to floor Plan d/c in am DM:  conitnue glimepiride   Chol on statin   Charlton Haws 07/05/2014, 9:08 AM

## 2014-07-06 DIAGNOSIS — E785 Hyperlipidemia, unspecified: Secondary | ICD-10-CM

## 2014-07-06 DIAGNOSIS — I25118 Atherosclerotic heart disease of native coronary artery with other forms of angina pectoris: Secondary | ICD-10-CM

## 2014-07-06 DIAGNOSIS — I10 Essential (primary) hypertension: Secondary | ICD-10-CM

## 2014-07-06 DIAGNOSIS — N189 Chronic kidney disease, unspecified: Secondary | ICD-10-CM

## 2014-07-06 DIAGNOSIS — N179 Acute kidney failure, unspecified: Secondary | ICD-10-CM

## 2014-07-06 LAB — GLUCOSE, CAPILLARY
Glucose-Capillary: 154 mg/dL — ABNORMAL HIGH (ref 70–99)
Glucose-Capillary: 174 mg/dL — ABNORMAL HIGH (ref 70–99)
Glucose-Capillary: 148 mg/dL — ABNORMAL HIGH (ref 70–99)
Glucose-Capillary: 179 mg/dL — ABNORMAL HIGH (ref 70–99)
Glucose-Capillary: 180 mg/dL — ABNORMAL HIGH (ref 70–99)

## 2014-07-06 MED ORDER — SODIUM CHLORIDE 0.9 % IV SOLN
INTRAVENOUS | Status: AC
  Administered 2014-07-06: 11:00:00 via INTRAVENOUS

## 2014-07-06 NOTE — Progress Notes (Signed)
Primary cardiologist: Dr. Peter SwazilandJordan  Seen for followup: Follow-up acute lateral STEMI  Subjective:    No chest pain or shortness of breath.  Objective:   Temp:  [97.4 F (36.3 C)-98.1 F (36.7 C)] 97.7 F (36.5 C) (01/24 0346) Pulse Rate:  [64-81] 75 (01/24 0929) Resp:  [16-18] 18 (01/24 0346) BP: (115-146)/(65-92) 118/82 mmHg (01/24 0929) SpO2:  [96 %-98 %] 96 % (01/24 0346) Weight:  [158 lb 1.6 oz (71.714 kg)-159 lb 1.6 oz (72.167 kg)] 158 lb 1.6 oz (71.714 kg) (01/24 0346) Last BM Date: 07/03/14  Filed Weights   07/05/14 0500 07/05/14 1811 07/06/14 0346  Weight: 156 lb 3.2 oz (70.852 kg) 159 lb 1.6 oz (72.167 kg) 158 lb 1.6 oz (71.714 kg)    Intake/Output Summary (Last 24 hours) at 07/06/14 0946 Last data filed at 07/06/14 0900  Gross per 24 hour  Intake    120 ml  Output      0 ml  Net    120 ml    Telemetry: Sinus rhythm with occasional PVCs.  Exam:  General: Appears comfortable at rest.  Lungs: Clear, nonlabored.  Cardiac: RRR, no gallop or rub.  Abdomen: NABS.  Extremities: No pitting edema.   Lab Results:  Basic Metabolic Panel:  Recent Labs Lab 07/04/14 0846 07/05/14 0231  NA 142 139  K 4.1 4.0  CL 107 105  CO2 25 26  GLUCOSE 124* 156*  BUN 28* 32*  CREATININE 1.71* 2.08*  CALCIUM 9.3 8.8    CBC:  Recent Labs Lab 07/04/14 0846 07/05/14 0231  WBC 4.7 5.9  HGB 13.7 12.5*  HCT 39.7 37.4*  MCV 89.2 90.6  PLT 153 142*    Cardiac Enzymes:  Recent Labs Lab 07/04/14 1140 07/04/14 1710 07/04/14 2240  TROPONINI 0.65* 2.72* 3.99*    Coagulation:  Recent Labs Lab 07/04/14 0846  INR 0.94    Echocardiogram 07/04/14: Study Conclusions  - Left ventricle: Distal septal hypokinesis Definity used due to poor endocardial definition. The cavity size was normal. Systolic function was normal. The estimated ejection fraction was in the range of 50% to 55%. Wall motion was normal; there were no regional wall  motion abnormalities. - Aortic valve: Calcified non coronary cusp. - Atrial septum: No defect or patent foramen ovale was identified.   Medications:   Scheduled Medications: . aspirin  81 mg Oral Daily  . atorvastatin  80 mg Oral q1800  . carvedilol  3.125 mg Oral BID WC  . glimepiride  4 mg Oral Q breakfast  . heparin  5,000 Units Subcutaneous 3 times per day  . insulin aspart  0-15 Units Subcutaneous TID WC  . sodium chloride  3 mL Intravenous Q12H  . ticagrelor  90 mg Oral BID    Infusions: . sodium chloride      PRN Medications: sodium chloride, acetaminophen, ALPRAZolam, alum & mag hydroxide-simeth, nitroGLYCERIN, ondansetron (ZOFRAN) IV, sodium chloride   Assessment:   1. Status post lateral STEMI on January 22.  2. CAD status post DES to the proximal circumflex and balloon angioplasty of the first diagonal on January 22. Residual distal 90% LAD stenosis being managed medically at this time. LVEF 50-55% by echocardiogram.  3. Acute on chronic renal insufficiency. Creatinine up to 2.1. Prior baseline has fluctuated over the years. Possible component of contrast nephropathy.  4. Type 2 diabetes mellitus.  5. History of hyperlipidemia, currently on emperic statin. No recent FLP.   Plan/Discussion:    Discussed with patient.  Would recommend gentle hydration and follow-up BMET to ensure trend is improving prior to discharge. Otherwise continue aspirin, Brilinta, Coreg, and Lipitor. Check FLP in the a.m. Ambulate as tolerated with cardiac rehabilitation. Possibly home tomorrow.   Jonelle Sidle, M.D., F.A.C.C.

## 2014-07-07 DIAGNOSIS — Z9861 Coronary angioplasty status: Secondary | ICD-10-CM

## 2014-07-07 DIAGNOSIS — E119 Type 2 diabetes mellitus without complications: Secondary | ICD-10-CM

## 2014-07-07 LAB — CBC
HCT: 38.3 % — ABNORMAL LOW (ref 39.0–52.0)
Hemoglobin: 13.1 g/dL (ref 13.0–17.0)
MCH: 30.4 pg (ref 26.0–34.0)
MCHC: 34.2 g/dL (ref 30.0–36.0)
MCV: 88.9 fL (ref 78.0–100.0)
Platelets: 142 10*3/uL — ABNORMAL LOW (ref 150–400)
RBC: 4.31 MIL/uL (ref 4.22–5.81)
RDW: 12.9 % (ref 11.5–15.5)
WBC: 4.9 10*3/uL (ref 4.0–10.5)

## 2014-07-07 LAB — LIPID PANEL
Cholesterol: 127 mg/dL (ref 0–200)
HDL: 44 mg/dL (ref >39)
LDL Cholesterol: 58 mg/dL (ref 0–99)
Total CHOL/HDL Ratio: 2.9 RATIO
Triglycerides: 123 mg/dL (ref <150)
VLDL: 25 mg/dL (ref 0–40)

## 2014-07-07 LAB — BASIC METABOLIC PANEL
Anion gap: 7 (ref 5–15)
BUN: 29 mg/dL — ABNORMAL HIGH (ref 6–23)
CO2: 26 mmol/L (ref 19–32)
Calcium: 9 mg/dL (ref 8.4–10.5)
Chloride: 108 mmol/L (ref 96–112)
Creatinine, Ser: 1.67 mg/dL — ABNORMAL HIGH (ref 0.50–1.35)
GFR calc Af Amer: 46 mL/min — ABNORMAL LOW (ref >90)
GFR calc non Af Amer: 40 mL/min — ABNORMAL LOW (ref >90)
Glucose, Bld: 174 mg/dL — ABNORMAL HIGH (ref 70–99)
Potassium: 3.8 mmol/L (ref 3.5–5.1)
Sodium: 141 mmol/L (ref 135–145)

## 2014-07-07 LAB — GLUCOSE, CAPILLARY: Glucose-Capillary: 170 mg/dL — ABNORMAL HIGH (ref 70–99)

## 2014-07-07 MED ORDER — TICAGRELOR 90 MG PO TABS: 90.0000 mg | ORAL_TABLET | Freq: Two times a day (BID) | ORAL | Status: DC

## 2014-07-07 MED ORDER — ASPIRIN 81 MG PO CHEW: 81.0000 mg | CHEWABLE_TABLET | Freq: Every day | ORAL | Status: AC

## 2014-07-07 MED ORDER — LISINOPRIL 2.5 MG PO TABS: 2.5000 mg | ORAL_TABLET | Freq: Every day | ORAL | Status: DC

## 2014-07-07 MED ORDER — CARVEDILOL 3.125 MG PO TABS: 3.1250 mg | ORAL_TABLET | Freq: Two times a day (BID) | ORAL | Status: DC

## 2014-07-07 MED ORDER — NITROGLYCERIN 0.4 MG SL SUBL: 0.4000 mg | SUBLINGUAL_TABLET | SUBLINGUAL | Status: DC | PRN

## 2014-07-07 MED ORDER — ATORVASTATIN CALCIUM 80 MG PO TABS: 80.0000 mg | ORAL_TABLET | Freq: Every day | ORAL | Status: DC

## 2014-07-07 MED FILL — Sodium Chloride IV Soln 0.9%: INTRAVENOUS | Qty: 50 | Status: AC

## 2014-07-07 NOTE — Progress Notes (Signed)
Patient Profile: [age] y/o male with acute lateral wall MI 07/03/14, s/p DES to circumflex and POB to diagonal. Residual distal LAD disease Rx medically.  EF 50-55% on echo.   Subjective: No complaints. Feeling well. No recurrent CP or dyspnea. Ambulating w/o difficulty. Ready to go home.   Objective: Vital signs in last 24 hours: Temp:  [97.5 F (36.4 C)-98 F (36.7 C)] 97.5 F (36.4 C) (01/25 0511) Pulse Rate:  [61-75] 61 (01/25 0511) Resp:  [18-19] 18 (01/25 0511) BP: (117-138)/(75-88) 123/80 mmHg (01/25 0511) SpO2:  [95 %-98 %] 97 % (01/25 0511) Weight:  [157 lb 14.4 oz (71.623 kg)] 157 lb 14.4 oz (71.623 kg) (01/25 0511) Last BM Date: 07/06/14  Intake/Output from previous day: 01/24 0701 - 01/25 0700 In: 2429.5 [P.O.:1544; I.V.:885.5] Out: 1625 [Urine:1625] Intake/Output this shift:    Medications Current Facility-Administered Medications  Medication Dose Route Frequency Provider Last Rate Last Dose  . 0.9 %  sodium chloride infusion   Intravenous Continuous Tilden FossaElizabeth Rees, MD      . 0.9 %  sodium chloride infusion  250 mL Intravenous PRN Ok Anishristopher R Berge, NP      . acetaminophen (TYLENOL) tablet 650 mg  650 mg Oral Q4H PRN Peter M SwazilandJordan, MD   650 mg at 07/06/14 2307  . ALPRAZolam Prudy Feeler(XANAX) tablet 0.25 mg  0.25 mg Oral BID PRN Dayna N Dunn, PA-C   0.25 mg at 07/06/14 2307  . alum & mag hydroxide-simeth (MAALOX/MYLANTA) 200-200-20 MG/5ML suspension 30 mL  30 mL Oral Q6H PRN Peter M SwazilandJordan, MD   30 mL at 07/04/14 2158  . aspirin chewable tablet 81 mg  81 mg Oral Daily Peter M SwazilandJordan, MD   81 mg at 07/06/14 [id number]  . atorvastatin (LIPITOR) tablet 80 mg  80 mg Oral q1800 Peter M SwazilandJordan, MD   80 mg at 07/06/14 1733  . carvedilol (COREG) tablet 3.125 mg  3.125 mg Oral BID WC Wendall StadePeter C Nishan, MD   3.125 mg at 07/06/14 1733  . glimepiride (AMARYL) tablet 4 mg  4 mg Oral Q breakfast Peter M SwazilandJordan, MD   4 mg at 07/07/14 [id number]  . heparin injection 5,000 Units  5,000 Units Subcutaneous  3 times per day Peter M SwazilandJordan, MD   5,000 Units at 07/07/14 0525  . insulin aspart (novoLOG) injection 0-15 Units  0-15 Units Subcutaneous TID WC Ok Anishristopher R Berge, NP   3 Units at 07/07/14 0711  . nitroGLYCERIN (NITROSTAT) SL tablet 0.4 mg  0.4 mg Sublingual Q5 Min x 3 PRN Ok Anishristopher R Berge, NP      . ondansetron [facility](ZOFRAN) injection 4 mg  4 mg Intravenous Q6H PRN Peter M SwazilandJordan, MD      . sodium chloride 0.9 % injection 3 mL  3 mL Intravenous Q12H Ok Anishristopher R Berge, NP   3 mL at 07/06/14 1124  . sodium chloride 0.9 % injection 3 mL  3 mL Intravenous PRN Ok Anishristopher R Berge, NP      . ticagrelor [facility](BRILINTA) tablet 90 mg  90 mg Oral BID Peter M SwazilandJordan, MD   90 mg at 07/06/14 2307    PE: General appearance: alert, cooperative and no distress Neck: no carotid bruit and no JVD Lungs: clear to auscultation bilaterally Heart: regular rate and rhythm, S1, S2 normal, no murmur, click, rub or gallop Extremities: no LEE Pulses: 2+ and symmetric Skin: warm and dry Neurologic: Grossly normal  Lab Results:   Recent Labs  07/04/14 0846 07/05/14 0231 07/07/14  0506  WBC 4.7 5.9 4.9  HGB 13.7 12.5* 13.1  HCT 39.7 37.4* 38.3*  PLT 153 142* 142*   BMET  Recent Labs  07/04/14 0846 07/05/14 0231 07/07/14 0506  NA 142 139 141  K 4.1 4.0 3.8  CL 107 105 108  CO2 GLUCOSE 124* 156* 174*  BUN 28* 32* 29*  CREATININE 1.71* 2.08* 1.67*  CALCIUM 9.3 8.8 9.0   PT/INR  Recent Labs  07/04/14 0846  LABPROT 12.7  INR 0.94   Cholesterol  Recent Labs  07/07/14 0506  CHOL 127    Assessment/Plan  Principal Problem:   ST elevation myocardial infarction (STEMI) of lateral wall Active Problems:   Diabetes mellitus type 2 in nonobese   Hyperlipidemia with target LDL less than 70   Essential hypertension   CAD S/P PCI: Lat STEMI: Cx PCI -Promus Premier DES 3.5 x 16, POBA of pD1    1. Lateral Wall STEMI/CAD: s/p DES to circumflex and POB to diagonal. Residual distal  LAD disease which will be treated medically. EF normal at 50-55%. No recurrent CP or dyspnea. Ambulating w/o difficulty. Continue DAPT with ASA + Brilinta for a minimum of 1 year given DES. Continue high dose statin and BB. Can add ACE/ARB as an outpatient.  2. Secondary Prevention/ risk factor modification:  Diabetes: continue DM meds. OK to resume Metformin as cath was > 48 hrs ago   HTN: BP is well controlled in the 120s systolic. Continue BB given CAD/recent MI. He will benefit from initiation of an ACE/ARB. May wait until f/u to start.    Atherosclerotic disease: continue high dose Lipitor. LDL this admit is at goal of < 70 at 58. HDL also great at 44.    Dispo: home today w/ outpatient f/u in 1-2 weeks with Dr. Swaziland or extender.    LOS: 3 days    Brittainy M. Sharol Harness, PA-C 07/07/2014 8:11 AM

## 2014-07-07 NOTE — Progress Notes (Signed)
CARDIAC REHAB PHASE I   PRE:  Rate/Rhythm: 72 SR    BP: sitting 120/76    SaO2:   MODE:  Ambulation: 690 ft   POST:  Rate/Rhythm: 86 SR    BP: sitting 120/80     SaO2:   Tolerated well. C/o left toe pain which has been present for 1 week. Sts it is getting better. Ed completed again with pt and wife as they remembered very little from Saturday. Reinforced Brilinta, DM diet (his A1C was 12 2 months ago, he has been cutting out sugars, etc), ex and NTG. [facility]. Pt and wife wanting to understand, receptive. Would benefit from continued DM education. A nutritionist tried to set up an appt with him previously. Encouraged him to call them back and schedule appt.  [phone]   Elissa LovettReeve, Osmar Howton PhiloKristan CES, ACSM 07/07/2014 9:28 AM

## 2014-07-07 NOTE — Discharge Summary (Signed)
Physician Discharge Summary  Patient ID: Scott MaffucciRonald J Derenzo MRN: [id number] DOB/AGE: [birth year] 74 y.o.   Primary Cardiologist: Dr. SwazilandJordan  Patient Profile: 74 y/o male w/o prior cardiac hx who [facility] ED 07/04/14 with c/p and lateral STEMI.  Admit date: 07/04/2014 Discharge date: 07/07/2014  Admission Diagnoses: Lateral Wall STEMI  Discharge Diagnoses:  Principal Problem:   ST elevation myocardial infarction (STEMI) of lateral wall Active Problems:   Diabetes mellitus type 2 in nonobese   Hyperlipidemia with target LDL less than 70   Essential hypertension   CAD S/P PCI: Lat STEMI: Cx PCI -Promus Premier DES 3.5 x 16, POBA of pD1   Discharged Condition: stable  Hospital Course:  The patient is a 74 y/o male w/o a prior cardiac hx, though he does have a h/o DM, HTN, and HL. He was in his usual state of health until about 2 days prior to admission when he was chopping some wood and noted sscp with dyspnea. This resolved spontaneously after a few mins. The day of admission, shortly after shoveling some snow, he developed severe recurrent sscp. He asked his wife to call EMS and following their arrival, [facility]. There, ECG showed 1mm lateral ST elevation. A Code STEMI was activated and he was taken to the cath lab. The procedure was performed by Dr. SwazilandJordan. Access was obtained via the right radial artery. Cath revealed severe LCX and diagonal dzs and both were treated with PCI (DES LCX, PTCA Diag). Pt tolerated procedure well and was tx to CCU where he remained pain free. He was placed on DAPT with ASA + Brilinta, along with high dose statin therapy as well as BB therapy. A 2D echo, revealed normal systolic function with an EF of 50-55%. He had no recurrent CP. Cardiac rehab was consulted and he had no difficulties with ambulation. His right radial access site remained stable and free from complication. He was continued on Metformin > 48 hrs post cath.  Prior to discharge, low dose ACE-I therapy with 2.5 mg of lisinopril was initiated. He was last seen and examined by Dr. Herbie BaltimoreHarding, who determined he was stable for discharge home. Post hospital f/u has been arranged with Nada BoozerLaura Ingold, NP, on 07/17/14. He will be followed long term by Dr. SwazilandJordan.   Consults: None  Significant Diagnostic Studies:   LHC 1/2 PROCEDURAL FINDINGS Hemodynamics: AO 101/63 mean 80 mm Hg LV 101/11 mm Hg  Coronary angiography: Coronary dominance: right  Left mainstem: Normal  Left anterior descending (LAD): The LAD is severely diseased in the distal vessel up to 90%. The artery is very small in caliber distally. The first diagonal has a 95% stenosis proximally.   Left circumflex (LCx): The LCx gives rise to a single large OM. There is a 90% stenosis in the proximal vessel.  Right coronary artery (RCA): Dominant vessel. There are 20% irregularities in the mid vessel. There is a 50% stenosis in the mid PDA.  Left ventriculography: Not done    2D echo 07/04/14 Study Conclusions  - Left ventricle: Distal septal hypokinesis Definity used due to poor endocardial definition. The cavity size was normal. Systolic function was normal. The estimated ejection fraction was in the range of 50% to 55%. Wall motion was normal; there were no regional wall motion abnormalities. - Aortic valve: Calcified non coronary cusp. - Atrial septum: No defect or patent foramen ovale was identified.   Treatments: See Hospital Course  Discharge Exam: Blood pressure  123/80, pulse 61, temperature 97.5 F (36.4 C), temperature source Oral, resp. rate 18, height  (1.651 m), weight 157 lb 14.4 oz (71.623 kg), SpO2 97 %.   Disposition: 01-Home or Self Care      Discharge Instructions    Amb Referral to Cardiac Rehabilitation    Complete by:  As directed      Diet - low sodium heart healthy    Complete by:  As directed      Driving Restrictions     Complete by:  As directed   No driving until follow-up visit.     Increase activity slowly    Complete by:  As directed      Lifting restrictions    Complete by:  As directed   No heavy lifting until follow-up visit            Medication List    TAKE these medications        aspirin 81 MG chewable tablet  Chew 1 tablet (81 mg total) by mouth daily.     atorvastatin 80 MG tablet  Commonly known as:  LIPITOR  Take 1 tablet (80 mg total) by mouth daily at 6 PM.     carvedilol 3.125 MG tablet  Commonly known as:  COREG  Take 1 tablet (3.125 mg total) by mouth 2 (two) times daily with a meal.     glimepiride 4 MG tablet  Commonly known as:  AMARYL  TAKE ONE-HALF TABLET BY MOUTH TWICE DAILY     ibuprofen 200 MG tablet  Commonly known as:  ADVIL,MOTRIN  Take 600 mg by mouth at bedtime as needed (pain/sleep).     Insulin Detemir 100 UNIT/ML Pen  Commonly known as:  LEVEMIR  Inject 25 Units into the skin at bedtime.     lisinopril 2.5 MG tablet  Commonly known as:  PRINIVIL,ZESTRIL  Take 1 tablet (2.5 mg total) by mouth daily.     metFORMIN 1000 MG tablet  Commonly known as:  GLUCOPHAGE  TAKE ONE TABLET BY MOUTH TWICE DAILY WITH MEALS     metroNIDAZOLE 0.75 % cream  Commonly known as:  METROCREAM  Apply 1 application topically 2 (two) times a week. For rosacea     MUCINEX PO  Take 1 tablet by mouth at bedtime as needed (nasal drip).     nitroGLYCERIN 0.4 MG SL tablet  Commonly known as:  NITROSTAT  Place 1 tablet (0.4 mg total) under the tongue every 5 (five) minutes x 3 doses as needed for chest pain.     OVER THE COUNTER MEDICATION  Take 2 strips by mouth daily as needed (gas/ flatulence). Gas x strips     ticagrelor 90 MG Tabs tablet  Commonly known as:  BRILINTA  Take 1 tablet (90 mg total) by mouth 2 (two) times daily.     ticagrelor 90 MG Tabs tablet  Commonly known as:  BRILINTA  Take 1 tablet (90 mg total) by mouth 2 (two) times daily.        Follow-up Information    Follow up with [facility] R, NP On 07/17/2014.   Specialty:  Cardiology   Why:  9:00 am    Contact information:   [address] [place] [address] [phone]        TIME SPENT ON DISCHARGE, INCLUDING PHYSICIAN TIME: >30 MINUTES  Signed: Robbie Lis 07/07/2014, 12:15 PM

## 2014-07-07 NOTE — Progress Notes (Signed)
Reviewed discharge instructions, dr. Appt, and medication with pt and wife. Pt and wife VU.

## 2014-07-09 ENCOUNTER — Telehealth: Payer: Self-pay | Admitting: Cardiology

## 2014-07-09 NOTE — Telephone Encounter (Signed)
Pt just discharged from the hospital on Monday. He having a lot of pain in his left knee,he wants to get a shot.He wants to know if this will be safe ,since he is on a blood thinner.

## 2014-07-09 NOTE — Telephone Encounter (Signed)
I spoke to Dr. SwazilandJordan and he said he was ok with the pt. Getting the injection but he could not stop taking the brilinta. Pt. Informed of Dr. Elvis CoilJordan's instructions, and pt. Stated understanding of instructions. Pt also stated that he had an injection of this nature about 9 years ago but couldn't remember what the doctors was, and didn't know who was going to do it this time. I told the pt. When he finds some one to do the injection to have them send us a procedure request form

## 2014-07-10 DIAGNOSIS — M79675 Pain in left toe(s): Secondary | ICD-10-CM | POA: Diagnosis not present

## 2014-07-10 DIAGNOSIS — M109 Gout, unspecified: Secondary | ICD-10-CM | POA: Diagnosis not present

## 2014-07-10 DIAGNOSIS — M25562 Pain in left knee: Secondary | ICD-10-CM | POA: Diagnosis not present

## 2014-07-17 ENCOUNTER — Encounter: Payer: Self-pay | Admitting: Cardiology

## 2014-07-17 ENCOUNTER — Ambulatory Visit (INDEPENDENT_AMBULATORY_CARE_PROVIDER_SITE_OTHER): Payer: Medicare Other | Admitting: Cardiology

## 2014-07-17 VITALS — BP 114/78 | HR 61 | Ht 65.0 in | Wt 157.3 lb

## 2014-07-17 DIAGNOSIS — N289 Disorder of kidney and ureter, unspecified: Secondary | ICD-10-CM

## 2014-07-17 DIAGNOSIS — I2510 Atherosclerotic heart disease of native coronary artery without angina pectoris: Secondary | ICD-10-CM | POA: Diagnosis not present

## 2014-07-17 LAB — BASIC METABOLIC PANEL
BUN: 41 mg/dL — ABNORMAL HIGH (ref 6–23)
CO2: 25 mEq/L (ref 19–32)
Calcium: 9.8 mg/dL (ref 8.4–10.5)
Chloride: 106 mEq/L (ref 96–112)
Creat: 1.66 mg/dL — ABNORMAL HIGH (ref 0.50–1.35)
Glucose, Bld: 89 mg/dL (ref 70–99)
Potassium: 4.8 mEq/L (ref 3.5–5.3)
Sodium: 143 mEq/L (ref 135–145)

## 2014-07-17 NOTE — Patient Instructions (Signed)
Please have lab work today.   Your physician recommends that you schedule a follow-up appointment in: 5-6 weeks with Dr. SwazilandJordan

## 2014-07-17 NOTE — Progress Notes (Signed)
Cardiology Office Note   Date:  07/17/2014   ID:  Scott Wilkins, DOB March 15, 1941, MRN [id number]  PCP:  Lillia Mountain, MD  Cardiologist:  Dr. Swaziland    Chief Complaint  Patient presents with  . Coronary Artery Disease    post STEMI, Stent placement, dyspnea on occasion. post hospital visit      History of Present Illness: Scott Wilkins is a 74 y.o. male who presents for post hospital follow up.  Previously he had no coronary disease that he was aware of and he was admitted January 22 of this year with chest pain and lateral ST elevation MI. He underwent emergent cath and received a drug-eluting stent Promus Premier to the circumflex. He also had PUBA of prox. diagonal 1. His EF remained fairly normal 50-55% and he did well postprocedure. He is diabetic.  Since discharge he is done quite well no chest pain or shortness of breath.  Does have episode of gout and has seen his orthopedic physician who injected his left knee and his foot is improving.  He is beginning exercise. He is eating healthy.  He is taking his Brilinta and aspirin along with his other medications.    Past Medical History  Diagnosis Date  . Hypertension   . Hyperlipidemia   . Rosacea   . Seizures     h/o - none in years  . Diabetes mellitus   . CAD (coronary artery disease)     a. 06/2014 Lateral STEMI/PCI: LM nl, LAD 90d, small, D1 95 (PTCA only), LCX 90p (3.5 x 16 Promus DES), RCA dominant, 42m, RPDA 22m.  . CKD (chronic kidney disease), stage III     Past Surgical History  Procedure Laterality Date  . Craniotomy  1948    left craniectomy after head trauma  . Rib fracture surgery  1976    ORIF after frcture due to trauma  . Left heart catheterization with coronary angiogram N/A 07/04/2014    Procedure: LEFT HEART CATHETERIZATION WITH CORONARY ANGIOGRAM;  Surgeon: Peter M Swaziland, MD;  Location: [facility] CATH LAB;  Service: Cardiovascular;  Laterality: N/A;  . Percutaneous coronary stent intervention  (pci-s)  07/04/2014    Procedure: PERCUTANEOUS CORONARY STENT INTERVENTION (PCI-S);  Surgeon: Peter M Swaziland, MD;  Location: [facility] CATH LAB;  Service: Cardiovascular;;  prox CFX     Current Outpatient Prescriptions  Medication Sig Dispense Refill  . aspirin 81 MG chewable tablet Chew 1 tablet (81 mg total) by mouth daily.    Marland Kitchen atorvastatin (LIPITOR) 80 MG tablet Take 1 tablet (80 mg total) by mouth daily at 6 PM. 30 tablet 5  . carvedilol (COREG) 3.125 MG tablet Take 1 tablet (3.125 mg total) by mouth 2 (two) times daily with a meal. 60 tablet 5  . glimepiride (AMARYL) 4 MG tablet TAKE ONE-HALF TABLET BY MOUTH TWICE DAILY (Patient taking differently: Take 2 mg by mouth 2 (two) times daily. ) 90 tablet 0  . GuaiFENesin (MUCINEX PO) Take 1 tablet by mouth at bedtime as needed (nasal drip).    . Insulin Detemir (LEVEMIR) 100 UNIT/ML Pen Inject 25 Units into the skin at bedtime.    Marland Kitchen lisinopril (PRINIVIL,ZESTRIL) 2.5 MG tablet Take 1 tablet (2.5 mg total) by mouth daily. 30 tablet 5  . metFORMIN (GLUCOPHAGE) 1000 MG tablet Take 500 mg by mouth 2 (two) times daily.    . metroNIDAZOLE (METROCREAM) 0.75 % cream Apply 1 application topically 2 (two) times a week. For rosacea    .  nitroGLYCERIN (NITROSTAT) 0.4 MG SL tablet Place 1 tablet (0.4 mg total) under the tongue every 5 (five) minutes x 3 doses as needed for chest pain. 25 tablet 2  . OVER THE COUNTER MEDICATION Take 2 strips by mouth daily as needed (gas/ flatulence). Gas x strips    . ticagrelor (BRILINTA) 90 MG TABS tablet Take 1 tablet (90 mg total) by mouth 2 (two) times daily. 60 tablet 10   No current facility-administered medications for this visit.    Allergies:   Review of patient's allergies indicates no known allergies.    Social History:  The patient  reports that he has never smoked. He has never used smokeless tobacco. He reports that he does not drink alcohol or use illicit drugs.   Family History:  The patient's family history  includes Heart disease in his brother, brother, father, and mother. There is no history of Cancer, Diabetes, or COPD.    ROS:  General:no colds or fevers, no weight changes Skin:no rashes or ulcers HEENT:no blurred vision, no congestion CV:see HPI PUL:see HPI GI:no diarrhea constipation or melena, no indigestion GU:no hematuria, no dysuria MS:no joint pain, no claudication- gout see HPI Neuro:no syncope, no lightheadedness Endo:+ diabetes glucose 98 this am, no thyroid disease   Wt Readings from Last 3 Encounters:  07/17/14 157 lb 4.8 oz (71.351 kg)  07/07/14 157 lb 14.4 oz (71.623 kg)  08/28/12 166 lb (75.297 kg)     PHYSICAL EXAM: VS:  BP 114/78 mmHg  Pulse 61  Ht  (1.651 m)  Wt 157 lb 4.8 oz (71.351 kg)  BMI 26.18 kg/m2 , BMI Body mass index is 26.18 kg/(m^2). General:Pleasant affect, NAD Skin:Warm and dry, brisk capillary refill HEENT:normocephalic, sclera clear, mucus membranes moist Neck:supple, no JVD, no bruits  Heart:S1S2 RRR without murmur, gallup, rub or click Lungs:clear without rales, rhonchi, or wheezes ZOX:WRUE, non tender, + BS, do not palpate liver spleen or masses Ext:no lower ext edema, 2+ pedal pulses, 2+ radial pulses Neuro:alert and oriented, MAE, follows commands, + facial symmetry  EKG:  EKG is ordered today. The ekg ordered today demonstrates SR rate 61 improved T wave changes I,AVL and continued V5-6 ST changes.     Recent Labs: 07/04/2014: B Natriuretic Peptide 32.1 07/05/2014: ALT 18 07/07/2014: BUN 29*; Creatinine 1.67*; Hemoglobin 13.1; Platelets 142*; Potassium 3.8; Sodium 141    Lipid Panel    Component Value Date/Time   CHOL 127 07/07/2014 0506   TRIG 123 07/07/2014 0506   HDL 44 07/07/2014 0506   CHOLHDL 2.9 07/07/2014 0506   VLDL 25 07/07/2014 0506   LDLCALC 58 07/07/2014 0506   LDLDIRECT 126.0 08/22/2008 1610       Other studies Reviewed: Additional studies/ records that were reviewed today include:hospital  record.   ASSESSMENT AND PLAN:  1.  ST elevation myocardial infarction (STEMI) of lateral wall with placement of Promus Premier drug-eluting stent to the circumflex and POBA to diagonal 1, he is on aspirin Brilinta as well as beta blocker and ACE inhibitor. He will follow-up with Dr. Swaziland in 5-6 weeks  2. Hyperlipidemia with target LDL less than 70.  On Lipitor 80  3. Essential hypertension controlled  4 diabetes mellitus type 2 in nonobese followed by primary care.       Current medicines are reviewed at length with the patient today.  The patient has no concerns about medications The following changes have been made:  See above  Labs/ tests ordered today include:see above  Disposition:   FU:  see above  Signed, Leone BrandINGOLD,[name] R, NP  07/17/2014 9:26 AM    [facility] [address], [place], [place]  [address]/ [address] [place], [place] Phone: [phone]) [phone]; Fax: [phone]  [phone] [phone]

## 2014-07-18 ENCOUNTER — Encounter: Payer: Self-pay | Admitting: Cardiology

## 2014-07-21 ENCOUNTER — Telehealth: Payer: Self-pay | Admitting: Cardiology

## 2014-07-21 MED ORDER — COLCHICINE 0.6 MG PO TABS: ORAL_TABLET | ORAL | Status: DC

## 2014-07-21 NOTE — Telephone Encounter (Signed)
[name]'s instructions noted, pt informed and voiced understanding.

## 2014-07-21 NOTE — Telephone Encounter (Signed)
Pt called in stating that the he spoke with laura about being prescribed some gout medicine for his left lower extremities and he would like to know if she could write the prescription for him. Please call  Thanks

## 2014-07-21 NOTE — Telephone Encounter (Signed)
Colchicine 0.6 mg BID stop if diarrhea.  Do this for 1 week then 1 daily

## 2014-07-21 NOTE — Telephone Encounter (Signed)
Pt called regarding gout medication. Seen by Nada BoozerLaura Ingold for hospital f/u. Scheduled for Dr. SwazilandJordan 3/24.  Do you want a lab order for uric acid? Any other recommendations for this patient?

## 2014-07-22 ENCOUNTER — Telehealth: Payer: Self-pay | Admitting: *Deleted

## 2014-07-22 NOTE — Telephone Encounter (Signed)
-----   Message from Leone BrandLaura R Ingold, NP sent at 07/18/2014  9:01 AM EST ----- Labs are stable.

## 2014-07-22 NOTE — Telephone Encounter (Signed)
Spoke to patient. Result given . Verbalized understanding  

## 2014-08-05 ENCOUNTER — Telehealth (HOSPITAL_COMMUNITY): Payer: Self-pay | Admitting: Cardiac Rehabilitation

## 2014-08-05 NOTE — Telephone Encounter (Signed)
pc to pt to enroll in outpatient cardpc to pt to enroll in outpatient cardiac rehab.  Pt declined due to 20% copay and foot pain due to gout flare up.  Dr. SwazilandJordan made aware.

## 2014-08-21 DIAGNOSIS — N183 Chronic kidney disease, stage 3 (moderate): Secondary | ICD-10-CM | POA: Diagnosis not present

## 2014-08-21 DIAGNOSIS — I252 Old myocardial infarction: Secondary | ICD-10-CM | POA: Diagnosis not present

## 2014-08-21 DIAGNOSIS — I10 Essential (primary) hypertension: Secondary | ICD-10-CM | POA: Diagnosis not present

## 2014-08-21 DIAGNOSIS — E119 Type 2 diabetes mellitus without complications: Secondary | ICD-10-CM | POA: Diagnosis not present

## 2014-09-04 ENCOUNTER — Encounter: Payer: Self-pay | Admitting: Cardiology

## 2014-09-04 ENCOUNTER — Ambulatory Visit (INDEPENDENT_AMBULATORY_CARE_PROVIDER_SITE_OTHER): Payer: Medicare Other | Admitting: Cardiology

## 2014-09-04 VITALS — BP 118/70 | HR 80 | Ht 65.0 in | Wt 156.2 lb

## 2014-09-04 DIAGNOSIS — Z9861 Coronary angioplasty status: Secondary | ICD-10-CM

## 2014-09-04 DIAGNOSIS — E785 Hyperlipidemia, unspecified: Secondary | ICD-10-CM | POA: Diagnosis not present

## 2014-09-04 DIAGNOSIS — I10 Essential (primary) hypertension: Secondary | ICD-10-CM | POA: Diagnosis not present

## 2014-09-04 DIAGNOSIS — I2510 Atherosclerotic heart disease of native coronary artery without angina pectoris: Secondary | ICD-10-CM | POA: Diagnosis not present

## 2014-09-04 DIAGNOSIS — E119 Type 2 diabetes mellitus without complications: Secondary | ICD-10-CM | POA: Diagnosis not present

## 2014-09-04 NOTE — Progress Notes (Signed)
Cardiology Office Note   Date:  09/04/2014   ID:  Scott Wilkins, DOB [birth year], MRN [id number]  PCP:  Scott Wilkins,Scott JOSEPH, MD  Cardiologist:  Scott Wilkins    Chief Complaint  Patient presents with  . Follow-up    1 mo. from Scott Wilkins      History of Present Illness: Scott Wilkins is a 74 y.o. male who is seen for follow up CAD.  He was admitted January 22 of this year with chest pain and lateral ST elevation MI. He underwent emergent cath and received a drug-eluting stent Promus Premier to the proximal circumflex. He also had POBA of prox. diagonal 1. He also had severe disease in the distal LAD that was too small for PCI. His EF remained fairly normal 50-55%. He is diabetic.  On follow up today he continues to do well. No chest pain or SOB. Sugars improved and he reports last A1c of 6%. Labs followed by Dr. Valentina Wilkins. He does walk some but not regularly.    Past Medical History  Diagnosis Date  . Hypertension   . Hyperlipidemia   . Rosacea   . Seizures     h/o - none in years  . Diabetes mellitus   . CAD (coronary artery disease)     a. 06/2014 Lateral STEMI/PCI: LM nl, LAD 90d, small, D1 95 (PTCA only), LCX 90p (3.5 x 16 Promus DES), RCA dominant, 4278m, RPDA 6083m.  . CKD (chronic kidney disease), stage III     Past Surgical History  Procedure Laterality Date  . Craniotomy  1948    left craniectomy after head trauma  . Rib fracture surgery  1976    ORIF after frcture due to trauma  . Left heart catheterization with coronary angiogram N/A 07/04/2014    Procedure: LEFT HEART CATHETERIZATION WITH CORONARY ANGIOGRAM;  Surgeon: Acey Woodfield M SwazilandJordan, MD;  [facility];  Service: Cardiovascular;  Laterality: N/A;  . Percutaneous coronary stent intervention (pci-s)  07/04/2014    Procedure: PERCUTANEOUS CORONARY STENT INTERVENTION (PCI-S);  Surgeon: Emmogene Simson M SwazilandJordan, MD;  [facility];  Service: Cardiovascular;;  prox CFX     Current Outpatient Prescriptions    Medication Sig Dispense Refill  . aspirin 81 MG chewable tablet Chew 1 tablet (81 mg total) by mouth daily.    Marland Kitchen. atorvastatin (LIPITOR) 80 MG tablet Take 1 tablet (80 mg total) by mouth daily at 6 PM. 30 tablet 5  . carvedilol (COREG) 3.125 MG tablet Take 1 tablet (3.125 mg total) by mouth 2 (two) times daily with a meal. 60 tablet 5  . colchicine 0.6 MG tablet TAKE 1 TABLET TWICE A DAY FOR 1 WEEK, THEN TAKE 1 TABLET DAILY 37 tablet 2  . glimepiride (AMARYL) 4 MG tablet TAKE ONE-HALF TABLET BY MOUTH TWICE DAILY (Patient taking differently: Take 2 mg by mouth 2 (two) times daily. ) 90 tablet 0  . GuaiFENesin (MUCINEX PO) Take 1 tablet by mouth at bedtime as needed (nasal drip).    . Insulin Detemir (LEVEMIR) 100 UNIT/ML Pen Inject 25 Units into the skin at bedtime.    Marland Kitchen. lisinopril (PRINIVIL,ZESTRIL) 2.5 MG tablet Take 1 tablet (2.5 mg total) by mouth daily. 30 tablet 5  . metFORMIN (GLUCOPHAGE) 1000 MG tablet Take 500 mg by mouth 2 (two) times daily.    . metroNIDAZOLE (METROCREAM) 0.75 % cream Apply 1 application topically 2 (two) times a week. For rosacea    . nitroGLYCERIN (NITROSTAT) 0.4 MG SL tablet  Place 1 tablet (0.4 mg total) under the tongue every 5 (five) minutes x 3 doses as needed for chest pain. 25 tablet 2  . OVER THE COUNTER MEDICATION Take 2 strips by mouth daily as needed (gas/ flatulence). Gas x strips    . ticagrelor (BRILINTA) 90 MG TABS tablet Take 1 tablet (90 mg total) by mouth 2 (two) times daily. 60 tablet 10   No current facility-administered medications for this visit.    Allergies:   Review of patient's allergies indicates no known allergies.    Social History:  The patient  reports that he has never smoked. He has never used smokeless tobacco. He reports that he does not drink alcohol or use illicit drugs.   Family History:  The patient's family history includes Heart disease in his brother, brother, father, and mother. There is no history of Cancer, Diabetes, or  COPD.    ROS:  General:no colds or fevers, no weight changes Skin:no rashes or ulcers HEENT:no blurred vision, no congestion CV:see HPI PUL:see HPI GI:no diarrhea constipation or melena, no indigestion GU:no hematuria, no dysuria MS:no joint pain, no claudication- gout see HPI Neuro:no syncope, no lightheadedness   Wt Readings from Last 3 Encounters:  09/04/14 156 lb 3.2 oz (70.852 kg)  07/17/14 157 lb 4.8 oz (71.351 kg)  07/07/14 157 lb 14.4 oz (71.623 kg)     PHYSICAL EXAM: VS:  BP 118/70 mmHg  Pulse 80  Ht  (1.651 m)  Wt 156 lb 3.2 oz (70.852 kg)  BMI 25.99 kg/m2 , BMI Body mass index is 25.99 kg/(m^2). General:Pleasant affect, NAD Skin:Warm and dry, brisk capillary refill HEENT:normocephalic, sclera clear, mucus membranes moist Neck:supple, no JVD, no bruits  Heart:S1S2 RRR without murmur, gallup, rub or click Lungs:clear without rales, rhonchi, or wheezes ZOX:WRUE, non tender, + BS, do not palpate liver spleen or masses Ext:no lower ext edema, 2+ pedal pulses, 2+ radial pulses Neuro:alert and oriented, MAE, follows commands, + facial symmetry     Recent Labs: 07/04/2014: B Natriuretic Peptide 32.1 07/05/2014: ALT 18 07/07/2014: Hemoglobin 13.1; Platelets 142* 07/17/2014: BUN 41*; Creatinine 1.66*; Potassium 4.8; Sodium 143    Lipid Panel    Component Value Date/Time   CHOL 127 07/07/2014 0506   TRIG 123 07/07/2014 0506   HDL 44 07/07/2014 0506   CHOLHDL 2.9 07/07/2014 0506   VLDL 25 07/07/2014 0506   LDLCALC 58 07/07/2014 0506   LDLDIRECT 126.0 08/22/2008 1610          ASSESSMENT AND PLAN:  1.  ST elevation myocardial infarction (STEMI) of lateral wall with placement of Promus Premier drug-eluting stent to the circumflex and POBA to diagonal 1, he is on aspirin Brilinta as well as beta blocker and ACE inhibitor. Recommend continuing DAPT for one year. Encourage increased aerobic activity.   2. Hyperlipidemia with target LDL less than 70.  On  Lipitor 80. I requested a copy of his lab with Dr. Valentina Lucks.   3. Essential hypertension controlled  4 Diabetes mellitus type 2 in nonobese followed by primary care.   I will follow up in 6 months

## 2014-09-04 NOTE — Patient Instructions (Signed)
Continue your current therapy  I will check your lab work from Dr. Valentina LucksGriffin  I will see you in 6 months

## 2014-11-27 DIAGNOSIS — N183 Chronic kidney disease, stage 3 (moderate): Secondary | ICD-10-CM | POA: Diagnosis not present

## 2014-11-27 DIAGNOSIS — Z794 Long term (current) use of insulin: Secondary | ICD-10-CM | POA: Diagnosis not present

## 2014-11-27 DIAGNOSIS — I129 Hypertensive chronic kidney disease with stage 1 through stage 4 chronic kidney disease, or unspecified chronic kidney disease: Secondary | ICD-10-CM | POA: Diagnosis not present

## 2014-11-27 DIAGNOSIS — E1122 Type 2 diabetes mellitus with diabetic chronic kidney disease: Secondary | ICD-10-CM | POA: Diagnosis not present

## 2015-01-03 ENCOUNTER — Other Ambulatory Visit: Payer: Self-pay | Admitting: Cardiology

## 2015-02-26 ENCOUNTER — Encounter: Payer: Self-pay | Admitting: Cardiology

## 2015-02-26 ENCOUNTER — Ambulatory Visit (INDEPENDENT_AMBULATORY_CARE_PROVIDER_SITE_OTHER): Payer: Medicare Other | Admitting: Cardiology

## 2015-02-26 VITALS — BP 130/78 | HR 65 | Ht 65.0 in | Wt 160.0 lb

## 2015-02-26 DIAGNOSIS — Z9861 Coronary angioplasty status: Secondary | ICD-10-CM

## 2015-02-26 DIAGNOSIS — I10 Essential (primary) hypertension: Secondary | ICD-10-CM | POA: Diagnosis not present

## 2015-02-26 DIAGNOSIS — I2510 Atherosclerotic heart disease of native coronary artery without angina pectoris: Secondary | ICD-10-CM | POA: Diagnosis not present

## 2015-02-26 DIAGNOSIS — E119 Type 2 diabetes mellitus without complications: Secondary | ICD-10-CM

## 2015-02-26 DIAGNOSIS — Z794 Long term (current) use of insulin: Secondary | ICD-10-CM | POA: Diagnosis not present

## 2015-02-26 DIAGNOSIS — E785 Hyperlipidemia, unspecified: Secondary | ICD-10-CM | POA: Diagnosis not present

## 2015-02-26 DIAGNOSIS — N183 Chronic kidney disease, stage 3 (moderate): Secondary | ICD-10-CM | POA: Diagnosis not present

## 2015-02-26 DIAGNOSIS — I129 Hypertensive chronic kidney disease with stage 1 through stage 4 chronic kidney disease, or unspecified chronic kidney disease: Secondary | ICD-10-CM | POA: Diagnosis not present

## 2015-02-26 DIAGNOSIS — Z1389 Encounter for screening for other disorder: Secondary | ICD-10-CM | POA: Diagnosis not present

## 2015-02-26 NOTE — Patient Instructions (Signed)
Continue your current therapy  On Jul 05, 2015 you can stop Brilinta  I will see you in 6 months.

## 2015-02-26 NOTE — Progress Notes (Signed)
Cardiology Office Note   Date:  02/26/2015   ID:  Scott Wilkins, DOB 26-Feb-1941, MRN [id number]  PCP:  Lillia Mountain, MD  Cardiologist:  Dr. Swaziland    Chief Complaint  Patient presents with  . Follow-up    some dizziness      History of Present Illness: Scott Wilkins is a 74 y.o. male who is seen for follow up CAD.  He was admitted July 04, 2014 with  lateral ST elevation MI. He underwent emergent cath and received a drug-eluting stent Promus Premier to the proximal circumflex. He also had POBA of prox. diagonal 1. He also had severe disease in the distal LAD that was too small for PCI. His EF remained fairly normal 50-55%. He is diabetic.  On follow up today he continues to do well. No chest pain or SOB. Reports sugars are OK. 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     Past Medical History  Diagnosis Date  . Hypertension   . Hyperlipidemia   . Rosacea   . Seizures     h/o - none in years  . Diabetes mellitus   . CAD (coronary artery disease)     a. 06/2014 Lateral STEMI/PCI: LM nl, LAD 90d, small, D1 95 (PTCA only), LCX 90p (3.5 x 16 Promus DES), RCA dominant, 56m, RPDA 20m.  . CKD (chronic kidney disease), stage III     Past Surgical History  Procedure Laterality Date  . Craniotomy  1948    left craniectomy after head trauma  . Rib fracture surgery  1976    ORIF after frcture due to trauma  . Left heart catheterization with coronary angiogram N/A 07/04/2014    Procedure: LEFT HEART CATHETERIZATION WITH CORONARY ANGIOGRAM;  Surgeon: Audreanna Torrisi M Swaziland, MD;  Location: [facility] CATH LAB;  Service: Cardiovascular;  Laterality: N/A;  . Percutaneous coronary stent intervention (pci-s)  07/04/2014    Procedure: PERCUTANEOUS CORONARY STENT INTERVENTION (PCI-S);  Surgeon: Asmar Brozek M Swaziland, MD;  Location: [facility] CATH LAB;  Service: Cardiovascular;;  prox CFX     Current Outpatient Prescriptions  Medication Sig Dispense Refill  . aspirin 81 MG chewable  tablet Chew 1 tablet (81 mg total) by mouth daily.    Marland Kitchen atorvastatin (LIPITOR) 80 MG tablet TAKE ONE TABLET BY MOUTH ONCE DAILY AT  6  PM 30 tablet 6  . carvedilol (COREG) 3.125 MG tablet TAKE ONE TABLET BY MOUTH TWICE DAILY WITH  A  MEAL 60 tablet 6  . colchicine 0.6 MG tablet TAKE 1 TABLET TWICE A DAY FOR 1 WEEK, THEN TAKE 1 TABLET DAILY 37 tablet 2  . glimepiride (AMARYL) 4 MG tablet TAKE ONE-HALF TABLET BY MOUTH TWICE DAILY (Patient taking differently: Take 2 mg by mouth 2 (two) times daily. ) 90 tablet 0  . GuaiFENesin (MUCINEX PO) Take 1 tablet by mouth at bedtime as needed (nasal drip).    . Insulin Detemir (LEVEMIR) 100 UNIT/ML Pen Inject 25 Units into the skin at bedtime.    Marland Kitchen lisinopril (PRINIVIL,ZESTRIL) 2.5 MG tablet TAKE ONE TABLET BY MOUTH ONCE DAILY 30 tablet 6  . metFORMIN (GLUCOPHAGE) 1000 MG tablet Take 500 mg by mouth 2 (two) times daily.    . metroNIDAZOLE (METROCREAM) 0.75 % cream Apply 1 application topically 2 (two) times a week. For rosacea    . nitroGLYCERIN (NITROSTAT) 0.4 MG SL tablet Place 1 tablet (0.4 mg total) under the tongue every 5 (five) minutes x 3 doses as needed for chest  pain. 25 tablet 2  . OVER THE COUNTER MEDICATION Take 2 strips by mouth daily as needed (gas/ flatulence). Gas x strips    . ticagrelor (BRILINTA) 90 MG TABS tablet Take 1 tablet (90 mg total) by mouth 2 (two) times daily. 60 tablet 10   No current facility-administered medications for this visit.    Allergies:   Review of patient's allergies indicates no known allergies.    Social History:  The patient  reports that he has never smoked. He has never used smokeless tobacco. He reports that he does not drink alcohol or use illicit drugs.   Family History:  The patient's family history includes Heart disease in his brother, brother, father, and mother. There is no history of Cancer, Diabetes, or COPD.    ROS:  General:no colds or fevers, no weight changes Skin:no rashes or  ulcers HEENT:no blurred vision, no congestion CV:see HPI PUL:see HPI GI:no diarrhea constipation or melena, no indigestion GU:no hematuria, no dysuria MS:no joint pain, no claudication Neuro:no syncope, no lightheadedness   Wt Readings from Last 3 Encounters:  02/26/15 72.576 kg (160 lb)  09/04/14 70.852 kg (156 lb 3.2 oz)  07/17/14 71.351 kg (157 lb 4.8 oz)     PHYSICAL EXAM: VS:  BP 130/78 mmHg  Pulse 65  Ht  (1.651 m)  Wt 72.576 kg (160 lb)  BMI 26.63 kg/m2 , BMI Body mass index is 26.63 kg/(m^2). General:Pleasant affect, NAD Skin:Warm and dry, brisk capillary refill HEENT:normocephalic, sclera clear, mucus membranes moist Neck:supple, no JVD, no bruits  Heart:S1S2 RRR without murmur, gallup, rub or click Lungs:clear without rales, rhonchi, or wheezes UJW:JXBJ, non tender, + BS, do not palpate liver spleen or masses Ext:no lower ext edema, 2+ pedal pulses, 2+ radial pulses Neuro:alert and oriented, MAE, follows commands, + facial symmetry     Recent Labs: 07/04/2014: B Natriuretic Peptide 32.1 07/05/2014: ALT 18 07/07/2014: Hemoglobin 13.1; Platelets 142* 07/17/2014: BUN 41*; Creat 1.66*; Potassium 4.8; Sodium 143    Lipid Panel    Component Value Date/Time   CHOL 127 07/07/2014 0506   TRIG 123 07/07/2014 0506   HDL 44 07/07/2014 0506   CHOLHDL 2.9 07/07/2014 0506   VLDL 25 07/07/2014 0506   LDLCALC 58 07/07/2014 0506   LDLDIRECT 126.0 08/22/2008 1610          ASSESSMENT AND PLAN:  1.  CAD s/p ST elevation myocardial infarction (STEMI) of lateral wall with placement of Promus Premier drug-eluting stent to the circumflex and POBA to diagonal 1 in January. He is on aspirin Brilinta as well as beta blocker and ACE inhibitor. Recommend continuing DAPT for one year. May stop Brilinta at the end of January 2017.  2. Hyperlipidemia with target LDL less than 70.  On Lipitor 80. Labs followed by Dr. Valentina Lucks.   3. Essential hypertension controlled  4  Diabetes mellitus type 2 in nonobese followed by primary care.   I will follow up in 6 months

## 2015-02-27 DIAGNOSIS — H2513 Age-related nuclear cataract, bilateral: Secondary | ICD-10-CM | POA: Diagnosis not present

## 2015-02-27 DIAGNOSIS — H524 Presbyopia: Secondary | ICD-10-CM | POA: Diagnosis not present

## 2015-02-27 DIAGNOSIS — E119 Type 2 diabetes mellitus without complications: Secondary | ICD-10-CM | POA: Diagnosis not present

## 2015-04-04 IMAGING — US US ABDOMEN COMPLETE
1 series · 13 of 25 positions shown · non-contrast
Comparison: Ultrasound of the abdomen of 05/18/2012

CLINICAL DATA: Epigastric abdominal pain, diarrhea

EXAM:
ULTRASOUND ABDOMEN COMPLETE

[Series 1: us abdomen complete · 0.29mm/px · 13 of 78 slices shown]
[im 1/78]
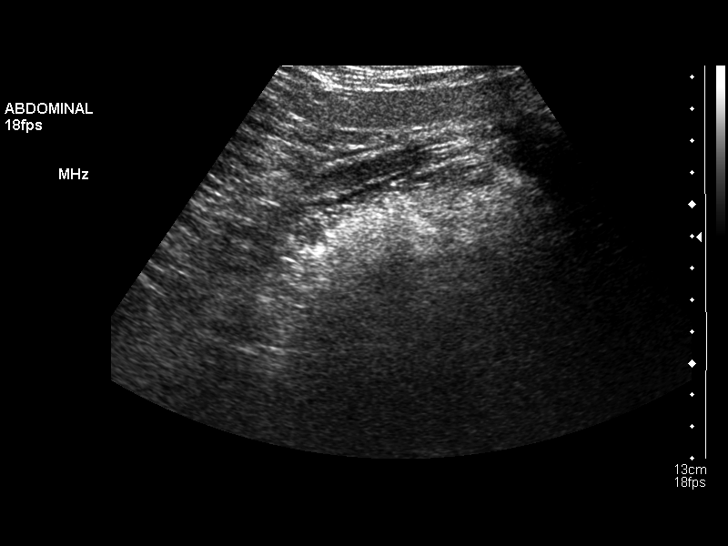
[im 7/78]
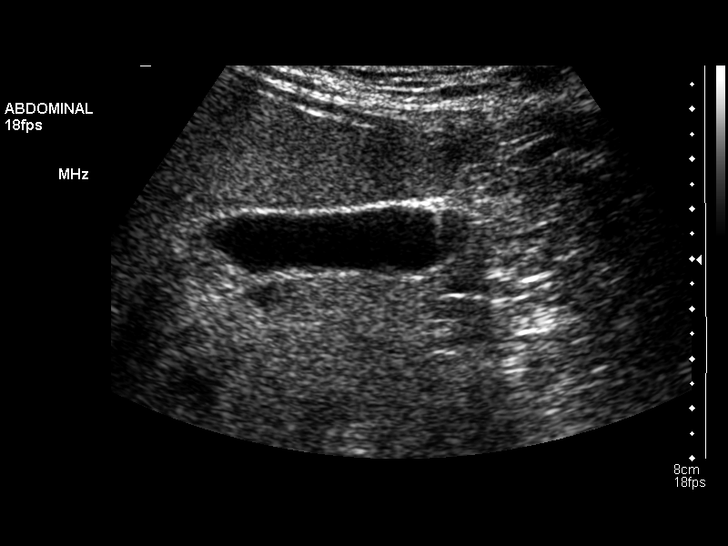
[im 13/78]
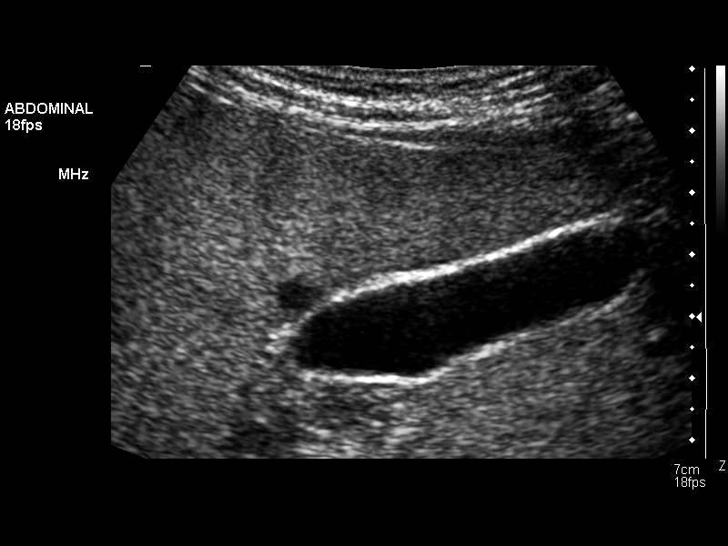
[im 20/78]
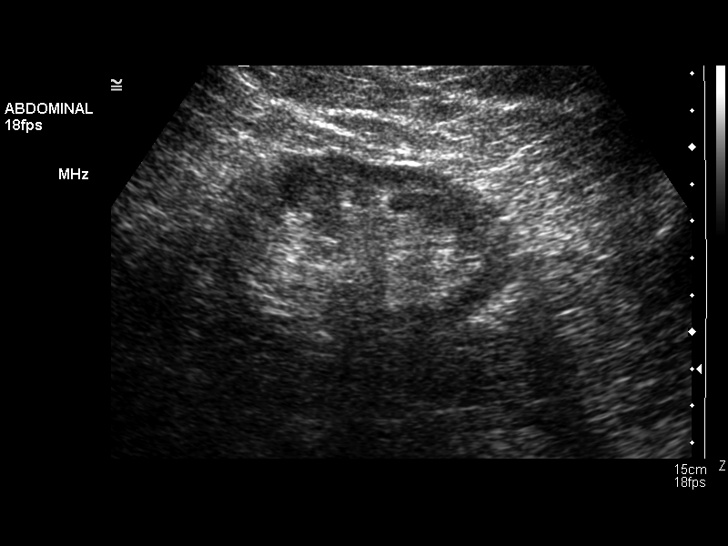
[im 26/78]
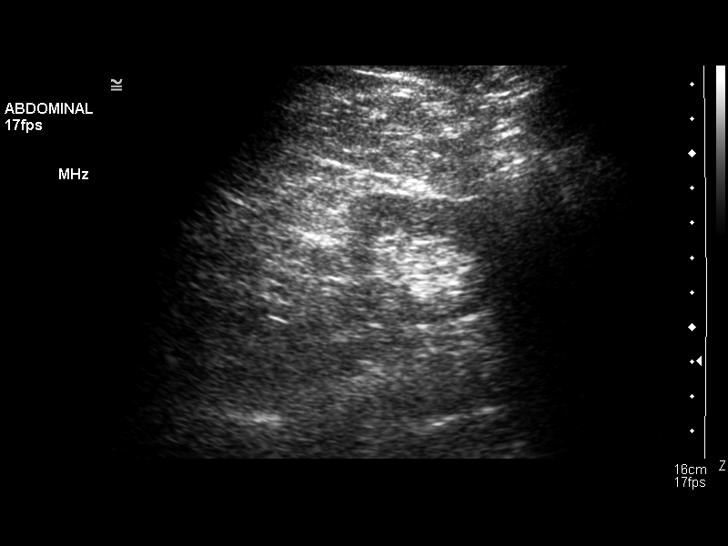
[im 33/78]
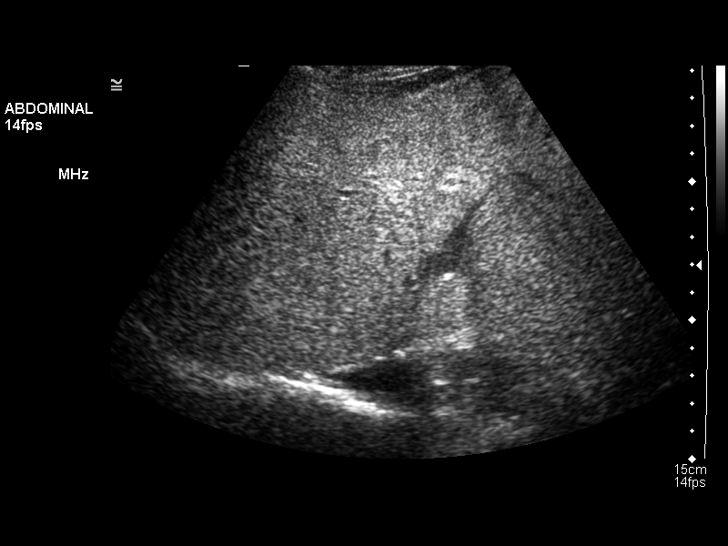
[im 39/78]
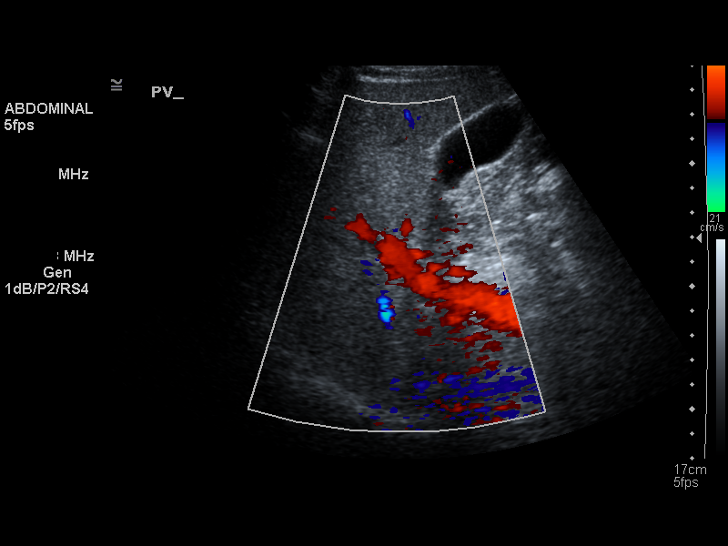
[im 45/78]
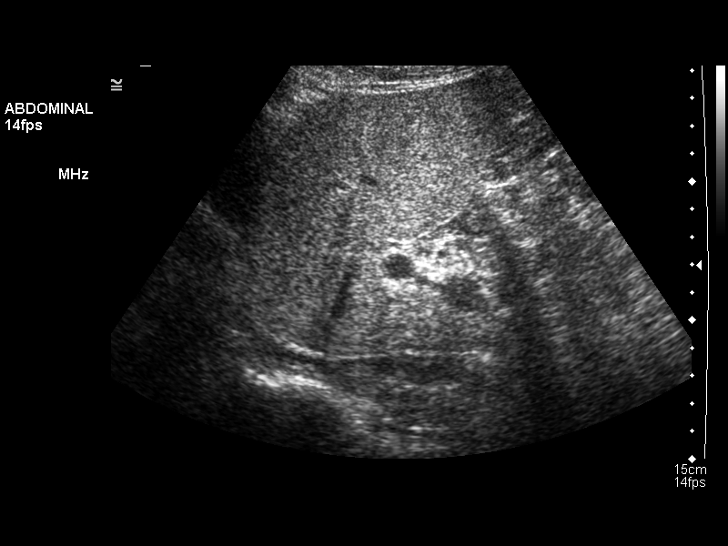
[im 52/78]
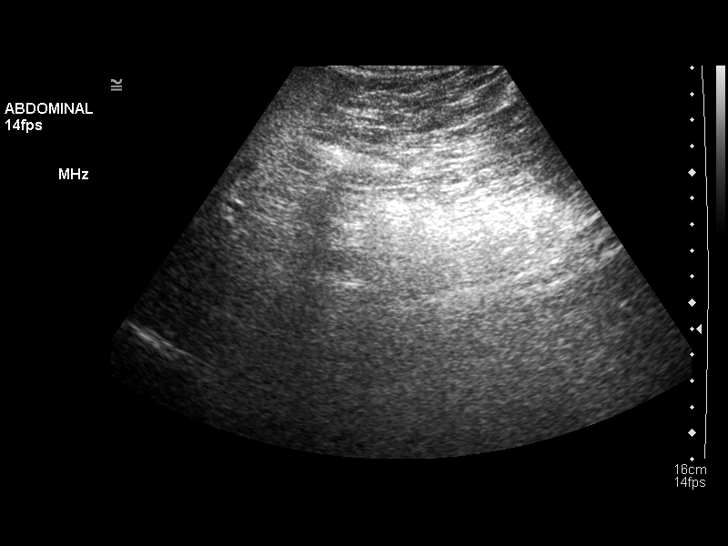
[im 58/78]
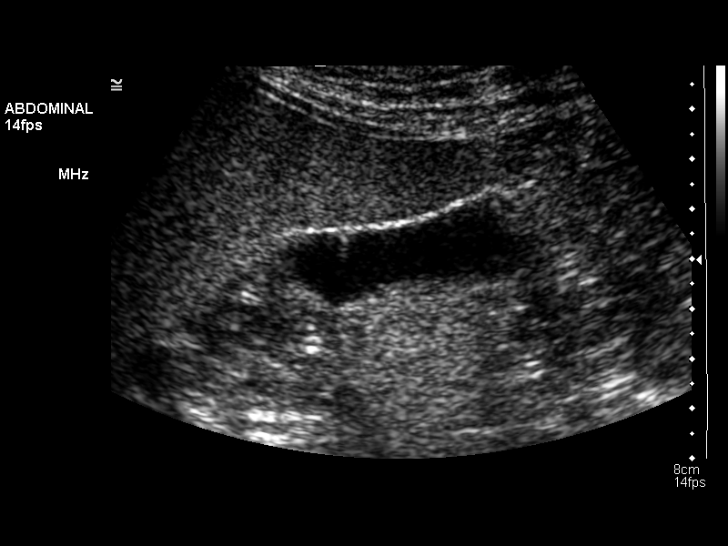
[im 65/78]
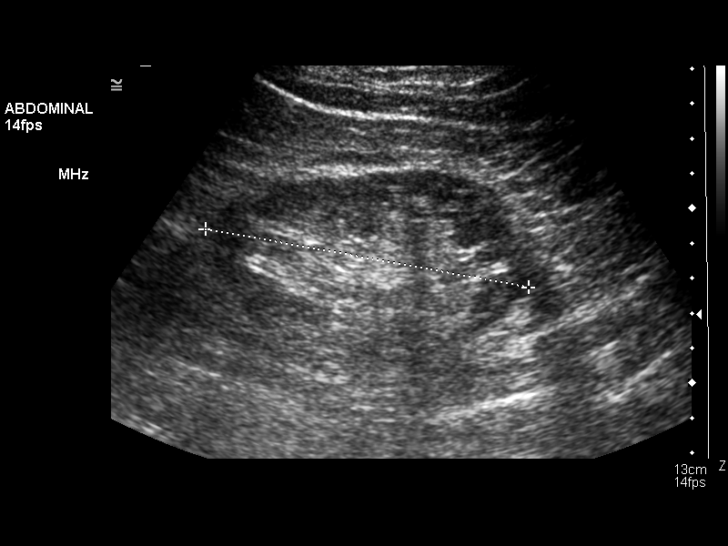
[im 71/78]
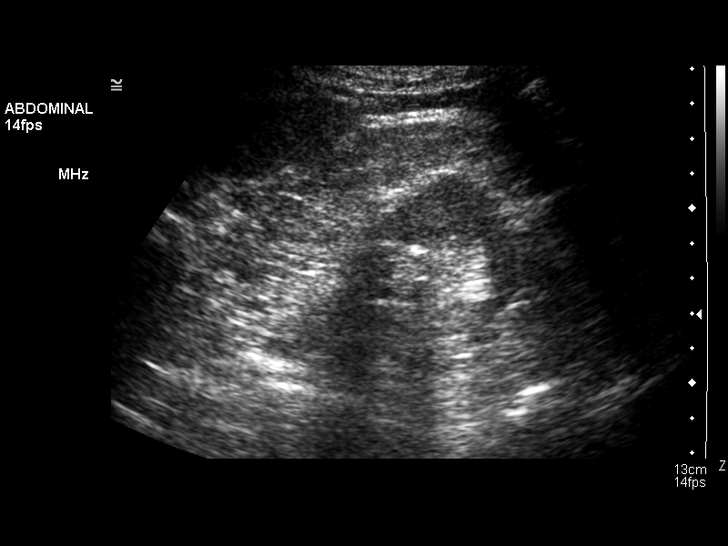
[im 78/78]
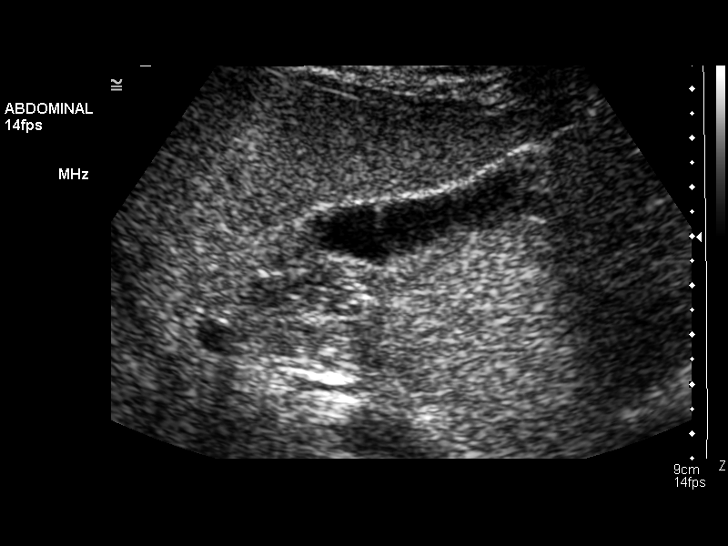

[13 of 25 positions shown; findings below may reference images not displayed]

FINDINGS: Gallbladder:

There is a non mobile echogenic focus of only 2 mm in diameter
consistent with a small gallbladder polyp. No gallstones are seen.
There is comet tail artifact in the fundus of the gallbladder
consistent with adenomyomatosis of the gallbladder.

Common bile duct:

Diameter: The common bile duct is normal measuring 4.4 mm in
diameter distally.

Liver:

The liver again is noted to be inhomogeneous and slightly echogenic
consistent with fatty infiltration. No focal abnormality is seen.

IVC:

The IVC is obscured by bowel gas.

Pancreas:

The pancreas also is largely obscured by bowel gas.

Spleen:

The spleen is normal measuring 8.8 cm sagittally.

Right Kidney:

Length: 8.8 cm..  No hydronephrosis is seen.

Left Kidney:

Length: 9.7 cm..  No hydronephrosis is noted.

Abdominal aorta:

The abdominal aorta is normal caliber.

Other findings:

None.
IMPRESSION: 1. Echogenic inhomogeneous liver consistent with fatty infiltration.
No focal abnormality.
2. No gallstones.  Probable 2 mm gallbladder polyp.
3. Adenomyomatosis of the gallbladder values.
4. The pancreas is largely obscured by bowel gas.

## 2015-04-15 ENCOUNTER — Encounter: Payer: Self-pay | Admitting: Gastroenterology

## 2015-05-04 DIAGNOSIS — J069 Acute upper respiratory infection, unspecified: Secondary | ICD-10-CM | POA: Diagnosis not present

## 2015-06-25 DIAGNOSIS — E7800 Pure hypercholesterolemia, unspecified: Secondary | ICD-10-CM | POA: Diagnosis not present

## 2015-06-25 DIAGNOSIS — N183 Chronic kidney disease, stage 3 (moderate): Secondary | ICD-10-CM | POA: Diagnosis not present

## 2015-06-25 DIAGNOSIS — I129 Hypertensive chronic kidney disease with stage 1 through stage 4 chronic kidney disease, or unspecified chronic kidney disease: Secondary | ICD-10-CM | POA: Diagnosis not present

## 2015-06-25 DIAGNOSIS — Z7984 Long term (current) use of oral hypoglycemic drugs: Secondary | ICD-10-CM | POA: Diagnosis not present

## 2015-06-25 DIAGNOSIS — E1122 Type 2 diabetes mellitus with diabetic chronic kidney disease: Secondary | ICD-10-CM | POA: Diagnosis not present

## 2015-08-05 ENCOUNTER — Other Ambulatory Visit: Payer: Self-pay | Admitting: Cardiology

## 2015-08-05 NOTE — Telephone Encounter (Signed)
Rx(s) sent to pharmacy electronically.  

## 2015-09-01 ENCOUNTER — Other Ambulatory Visit: Payer: Self-pay | Admitting: Cardiology

## 2015-09-01 ENCOUNTER — Other Ambulatory Visit: Payer: Self-pay | Admitting: *Deleted

## 2015-09-01 DIAGNOSIS — H60509 Unspecified acute noninfective otitis externa, unspecified ear: Secondary | ICD-10-CM | POA: Insufficient documentation

## 2015-09-01 DIAGNOSIS — H60331 Swimmer's ear, right ear: Secondary | ICD-10-CM | POA: Diagnosis not present

## 2015-09-01 MED ORDER — LISINOPRIL 2.5 MG PO TABS: 2.5000 mg | ORAL_TABLET | Freq: Every day | ORAL | Status: DC

## 2015-09-01 NOTE — Telephone Encounter (Signed)
Lisinopril refilled 08/05/15

## 2015-09-03 DIAGNOSIS — H6503 Acute serous otitis media, bilateral: Secondary | ICD-10-CM | POA: Diagnosis not present

## 2015-09-03 DIAGNOSIS — H6123 Impacted cerumen, bilateral: Secondary | ICD-10-CM | POA: Diagnosis not present

## 2015-09-03 DIAGNOSIS — H7203 Central perforation of tympanic membrane, bilateral: Secondary | ICD-10-CM | POA: Diagnosis not present

## 2016-01-28 DIAGNOSIS — I10 Essential (primary) hypertension: Secondary | ICD-10-CM | POA: Diagnosis not present

## 2016-01-28 DIAGNOSIS — Z7984 Long term (current) use of oral hypoglycemic drugs: Secondary | ICD-10-CM | POA: Diagnosis not present

## 2016-01-28 DIAGNOSIS — N183 Chronic kidney disease, stage 3 (moderate): Secondary | ICD-10-CM | POA: Diagnosis not present

## 2016-01-28 DIAGNOSIS — Z1389 Encounter for screening for other disorder: Secondary | ICD-10-CM | POA: Diagnosis not present

## 2016-01-28 DIAGNOSIS — Z0000 Encounter for general adult medical examination without abnormal findings: Secondary | ICD-10-CM | POA: Diagnosis not present

## 2016-01-28 DIAGNOSIS — E1122 Type 2 diabetes mellitus with diabetic chronic kidney disease: Secondary | ICD-10-CM | POA: Diagnosis not present

## 2016-03-31 ENCOUNTER — Other Ambulatory Visit: Payer: Self-pay | Admitting: Cardiology

## 2016-04-11 ENCOUNTER — Other Ambulatory Visit: Payer: Self-pay | Admitting: Cardiology

## 2016-04-11 NOTE — Telephone Encounter (Signed)
Rx request sent to pharmacy.  

## 2016-06-14 DIAGNOSIS — I129 Hypertensive chronic kidney disease with stage 1 through stage 4 chronic kidney disease, or unspecified chronic kidney disease: Secondary | ICD-10-CM | POA: Diagnosis not present

## 2016-06-14 DIAGNOSIS — I2510 Atherosclerotic heart disease of native coronary artery without angina pectoris: Secondary | ICD-10-CM | POA: Diagnosis not present

## 2016-06-14 DIAGNOSIS — Z7984 Long term (current) use of oral hypoglycemic drugs: Secondary | ICD-10-CM | POA: Diagnosis not present

## 2016-06-14 DIAGNOSIS — E1122 Type 2 diabetes mellitus with diabetic chronic kidney disease: Secondary | ICD-10-CM | POA: Diagnosis not present

## 2016-06-14 DIAGNOSIS — N183 Chronic kidney disease, stage 3 (moderate): Secondary | ICD-10-CM | POA: Diagnosis not present

## 2016-07-04 ENCOUNTER — Other Ambulatory Visit: Payer: Self-pay | Admitting: Cardiology

## 2016-07-04 NOTE — Telephone Encounter (Signed)
Rx(s) sent to pharmacy electronically.  

## 2016-08-05 ENCOUNTER — Other Ambulatory Visit: Payer: Self-pay | Admitting: Cardiology

## 2016-08-29 ENCOUNTER — Other Ambulatory Visit: Payer: Self-pay | Admitting: Cardiology

## 2016-08-29 NOTE — Telephone Encounter (Signed)
°*  STAT* If patient is at the pharmacy, call can be transferred to refill team.   1. Which medications need to be refilled? (please list name of each medication and dose if known) Carvedilol 3.125 mg   2. Which pharmacy/location (including street and city if local pharmacy) is medication to be sent to?[facility] [place]   3. Do they need a 30 day or 90 day supply?90

## 2016-08-29 NOTE — Telephone Encounter (Signed)
°*  STAT* If patient is at the pharmacy, call can be transferred to refill team.   1. Which medications need to be refilled? (please list name of each medication and dose if known)Lisinopril 2.5 mg , Atorvastatin 80mg    2. Which pharmacy/location (including street and city if local pharmacy) is [facility] [place]   3. Do they need a 30 day or 90 day supply? 90

## 2016-08-30 MED ORDER — LISINOPRIL 2.5 MG PO TABS: ORAL_TABLET | ORAL | 0 refills | Status: DC

## 2016-08-30 MED ORDER — CARVEDILOL 3.125 MG PO TABS: 3.1250 mg | ORAL_TABLET | Freq: Two times a day (BID) | ORAL | 0 refills | Status: DC

## 2016-08-30 NOTE — Telephone Encounter (Signed)
Rx sent to pharmacy electronically-30 day supply, patient needs OV appt.  Attempt #2.

## 2016-10-03 ENCOUNTER — Other Ambulatory Visit: Payer: Self-pay | Admitting: Cardiology

## 2016-10-18 DIAGNOSIS — N183 Chronic kidney disease, stage 3 (moderate): Secondary | ICD-10-CM | POA: Diagnosis not present

## 2016-10-18 DIAGNOSIS — I129 Hypertensive chronic kidney disease with stage 1 through stage 4 chronic kidney disease, or unspecified chronic kidney disease: Secondary | ICD-10-CM | POA: Diagnosis not present

## 2016-10-18 DIAGNOSIS — Z7984 Long term (current) use of oral hypoglycemic drugs: Secondary | ICD-10-CM | POA: Diagnosis not present

## 2016-10-18 DIAGNOSIS — M1812 Unilateral primary osteoarthritis of first carpometacarpal joint, left hand: Secondary | ICD-10-CM | POA: Diagnosis not present

## 2016-10-18 DIAGNOSIS — Z794 Long term (current) use of insulin: Secondary | ICD-10-CM | POA: Diagnosis not present

## 2016-10-18 DIAGNOSIS — E1122 Type 2 diabetes mellitus with diabetic chronic kidney disease: Secondary | ICD-10-CM | POA: Diagnosis not present

## 2016-11-02 NOTE — Progress Notes (Addendum)
Cardiology Office Note   Date:  11/03/2016   ID:  Scott Wilkins December 20, 1940, MRN [id number]  PCP:  Scott Funk, MD  Cardiologist:  Dr. Swaziland    Chief Complaint  Patient presents with  . Coronary Artery Disease      History of Present Illness: Scott Wilkins is a 76 y.o. male who is seen for follow up CAD.  He was admitted July 04, 2014 with  lateral ST elevation MI. He underwent emergent cath and received a drug-eluting stent Promus Premier to the proximal circumflex. He also had POBA of prox. diagonal 1. He also had severe disease in the distal LAD that was too small for PCI. His EF remained fairly normal 50-55%. He is diabetic. He was last seen in September 2016.   On follow up today he continues to do well. He denies any chest pain or SOB. Reports sugars are fair. He stays active doing yard work and fishing. No other new health problems. Needs prescriptions refilled.    Past Medical History:  Diagnosis Date  . CAD (coronary artery disease)    a. 06/2014 Lateral STEMI/PCI: LM nl, LAD 90d, small, D1 95 (PTCA only), LCX 90p (3.5 x 16 Promus DES), RCA dominant, 63m, RPDA 61m.  . CKD (chronic kidney disease), stage III   . Diabetes mellitus   . Hyperlipidemia   . Hypertension   . Rosacea   . Seizures (HCC)    h/o - none in years    Past Surgical History:  Procedure Laterality Date  . CRANIOTOMY  1948   left craniectomy after head trauma  . LEFT HEART CATHETERIZATION WITH CORONARY ANGIOGRAM N/A 07/04/2014   Procedure: LEFT HEART CATHETERIZATION WITH CORONARY ANGIOGRAM;  Surgeon: Peter M Swaziland, MD;  Location: [facility] CATH LAB;  Service: Cardiovascular;  Laterality: N/A;  . PERCUTANEOUS CORONARY STENT INTERVENTION (PCI-S)  07/04/2014   Procedure: PERCUTANEOUS CORONARY STENT INTERVENTION (PCI-S);  Surgeon: Peter M Swaziland, MD;  Location: [facility] CATH LAB;  Service: Cardiovascular;;  prox CFX  . RIB FRACTURE SURGERY  1976   ORIF after frcture due to trauma     Current  Outpatient Prescriptions  Medication Sig Dispense Refill  . aspirin 81 MG chewable tablet Chew 1 tablet (81 mg total) by mouth daily.    Marland Kitchen atorvastatin (LIPITOR) 80 MG tablet Take 1 tablet (80 mg total) by mouth daily at 6 PM. 90 tablet 3  . carvedilol (COREG) 3.125 MG tablet Take 1 tablet (3.125 mg total) by mouth 2 (two) times daily with a meal. PLEASE CONTACT OFFICE FOR ADDITIONAL REFILLS. Attempt #2 180 tablet 3  . colchicine 0.6 MG tablet TAKE 1 TABLET TWICE A DAY FOR 1 WEEK, THEN TAKE 1 TABLET DAILY 37 tablet 2  . glimepiride (AMARYL) 4 MG tablet TAKE ONE-HALF TABLET BY MOUTH TWICE DAILY (Patient taking differently: Take 2 mg by mouth 2 (two) times daily. ) 90 tablet 0  . Insulin Detemir (LEVEMIR) 100 UNIT/ML Pen Inject 25 Units into the skin at bedtime.    . metFORMIN (GLUCOPHAGE) 1000 MG tablet Take 500 mg by mouth 2 (two) times daily.    . nitroGLYCERIN (NITROSTAT) 0.4 MG SL tablet Place 1 tablet (0.4 mg total) under the tongue every 5 (five) minutes x 3 doses as needed for chest pain. 25 tablet 2  . OVER THE COUNTER MEDICATION Take 2 strips by mouth daily as needed (gas/ flatulence). Gas x strips    . lisinopril (PRINIVIL,ZESTRIL) 5 MG tablet Take 1 tablet (  5 mg total) by mouth daily. 90 tablet 3   No current facility-administered medications for this visit.     Allergies:   Patient has no known allergies.    Social History:  The patient  reports that he has never smoked. He has never used smokeless tobacco. He reports that he does not drink alcohol or use drugs.   Family History:  The patient's family history includes Heart disease in his brother, brother, father, and mother.    ROS:  As noted in HPI. All other systems are reviewed and are otherwise normal.   Wt Readings from Last 3 Encounters:  11/03/16 167 lb 3.2 oz (75.8 kg)  02/26/15 160 lb (72.6 kg)  09/04/14 156 lb 3.2 oz (70.9 kg)     PHYSICAL EXAM: VS:  BP (!) 142/72   Pulse 69   Ht 5\' 5"  (1.651 m)   Wt 167 lb  3.2 oz (75.8 kg)   SpO2 93%   BMI 27.82 kg/m  , BMI Body mass index is 27.82 kg/m. General:Pleasant affect, NAD Skin:Warm and dry, brisk capillary refill HEENT:normocephalic, sclera clear, mucus membranes moist Neck:supple, no JVD, no bruits  Heart:S1S2 RRR without murmur, gallop, rub or click Lungs:clear without rales, rhonchi, or wheezes ZOX:WRUEAbd:soft, non tender, + BS, do not palpate liver spleen or masses Ext:no lower ext edema, 2+ pedal pulses, 2+ radial pulses Neuro:alert and oriented, MAE, follows commands, + facial symmetry     Recent Labs: No results found for requested labs within last 8760 hours.    Lipid Panel    Component Value Date/Time   CHOL 127 07/07/2014 0506   TRIG 123 07/07/2014 0506   HDL 44 07/07/2014 0506   CHOLHDL 2.9 07/07/2014 0506   VLDL 25 07/07/2014 0506   LDLCALC 58 07/07/2014 0506   LDLDIRECT 126.0 08/22/2008 1610    Labs dated 06/25/15: cholesterol 122, triglycerides 126, HDL 46, LDL 51. BUN 25, creatinine 1.56. CMET otherwise normal  Dated 10/18/16 A1c 7.8%   Ecg today shows NSR with normal Ecg. I have personally reviewed and interpreted this study.    ASSESSMENT AND PLAN:  1.  CAD s/p  STEMI of lateral wall with placement of Promus Premier drug-eluting stent to the circumflex and POBA to diagonal 1 in January 2016. He is on aspirin, beta blocker, and ACE inhibitor. He is asymptomatic.  2. Hyperlipidemia with target LDL less than 70.  On Lipitor 80. Will update fasting chemistries and lipids today.  3. Essential hypertension- BP is a little high. Will increase lisinopril to 5 mg daily.   4 Diabetes mellitus type 2 in nonobese followed by primary care. On metformin and amaryl.    I will follow up in one year. All cardiac prescriptions refilled.

## 2016-11-03 ENCOUNTER — Ambulatory Visit (INDEPENDENT_AMBULATORY_CARE_PROVIDER_SITE_OTHER): Payer: Medicare Other | Admitting: Cardiology

## 2016-11-03 ENCOUNTER — Encounter: Payer: Self-pay | Admitting: Cardiology

## 2016-11-03 ENCOUNTER — Other Ambulatory Visit: Payer: Self-pay

## 2016-11-03 VITALS — BP 142/72 | HR 69 | Ht 65.0 in | Wt 167.2 lb

## 2016-11-03 DIAGNOSIS — I2510 Atherosclerotic heart disease of native coronary artery without angina pectoris: Secondary | ICD-10-CM

## 2016-11-03 DIAGNOSIS — Z9861 Coronary angioplasty status: Secondary | ICD-10-CM

## 2016-11-03 DIAGNOSIS — E785 Hyperlipidemia, unspecified: Secondary | ICD-10-CM | POA: Diagnosis not present

## 2016-11-03 DIAGNOSIS — E119 Type 2 diabetes mellitus without complications: Secondary | ICD-10-CM

## 2016-11-03 DIAGNOSIS — I10 Essential (primary) hypertension: Secondary | ICD-10-CM | POA: Diagnosis not present

## 2016-11-03 LAB — HEPATIC FUNCTION PANEL
ALT: 20 IU/L (ref 0–44)
AST: 20 IU/L (ref 0–40)
Albumin: 4.3 g/dL (ref 3.5–4.8)
Alkaline Phosphatase: 132 IU/L — ABNORMAL HIGH (ref 39–117)
Bilirubin Total: 0.5 mg/dL (ref 0.0–1.2)
Bilirubin, Direct: 0.16 mg/dL (ref 0.00–0.40)
Total Protein: 6.4 g/dL (ref 6.0–8.5)

## 2016-11-03 LAB — LIPID PANEL
Chol/HDL Ratio: 2.6 ratio (ref 0.0–5.0)
Cholesterol, Total: 120 mg/dL (ref 100–199)
HDL: 47 mg/dL (ref >39)
LDL Calculated: 52 mg/dL (ref 0–99)
Triglycerides: 106 mg/dL (ref 0–149)
VLDL Cholesterol Cal: 21 mg/dL (ref 5–40)

## 2016-11-03 LAB — BASIC METABOLIC PANEL
BUN/Creatinine Ratio: 18 (ref 10–24)
BUN: 35 mg/dL — ABNORMAL HIGH (ref 8–27)
CO2: 22 mmol/L (ref 18–29)
Calcium: 9.3 mg/dL (ref 8.6–10.2)
Chloride: 104 mmol/L (ref 96–106)
Creatinine, Ser: 1.9 mg/dL — ABNORMAL HIGH (ref 0.76–1.27)
GFR calc Af Amer: 40 mL/min/{1.73_m2} — ABNORMAL LOW (ref >59)
GFR calc non Af Amer: 34 mL/min/{1.73_m2} — ABNORMAL LOW (ref >59)
Glucose: 307 mg/dL — ABNORMAL HIGH (ref 65–99)
Potassium: 4.7 mmol/L (ref 3.5–5.2)
Sodium: 142 mmol/L (ref 134–144)

## 2016-11-03 MED ORDER — CARVEDILOL 3.125 MG PO TABS
3.1250 mg | ORAL_TABLET | Freq: Two times a day (BID) | ORAL | 3 refills | Status: DC
Start: 2016-11-03 — End: 2016-11-03

## 2016-11-03 MED ORDER — LISINOPRIL 5 MG PO TABS: 5.0000 mg | ORAL_TABLET | Freq: Every day | ORAL | 3 refills | Status: DC

## 2016-11-03 MED ORDER — CARVEDILOL 6.25 MG PO TABS: 6.2500 mg | ORAL_TABLET | Freq: Two times a day (BID) | ORAL | 3 refills | Status: DC

## 2016-11-03 MED ORDER — ATORVASTATIN CALCIUM 80 MG PO TABS: 80.0000 mg | ORAL_TABLET | Freq: Every day | ORAL | 3 refills | Status: DC

## 2016-11-03 NOTE — Patient Instructions (Signed)
Increase lisinopril to 5 mg daily  Continue your other therapy  We will check blood work today  I will see you in one year

## 2016-11-03 NOTE — Addendum Note (Signed)
Addended by: Neoma LamingPUGH, CHERYL J on: 11/03/2016 12:13 PM   Modules accepted: Orders

## 2016-11-17 ENCOUNTER — Other Ambulatory Visit: Payer: Self-pay | Admitting: *Deleted

## 2016-11-17 DIAGNOSIS — I10 Essential (primary) hypertension: Secondary | ICD-10-CM

## 2016-11-17 LAB — BASIC METABOLIC PANEL
BUN: 29 mg/dL — ABNORMAL HIGH (ref 7–25)
CO2: 23 mmol/L (ref 20–31)
Calcium: 9.1 mg/dL (ref 8.6–10.3)
Chloride: 104 mmol/L (ref 98–110)
Creat: 1.88 mg/dL — ABNORMAL HIGH (ref 0.70–1.18)
Glucose, Bld: 396 mg/dL — ABNORMAL HIGH (ref 65–99)
Potassium: 4.4 mmol/L (ref 3.5–5.3)
Sodium: 139 mmol/L (ref 135–146)

## 2016-11-21 ENCOUNTER — Other Ambulatory Visit: Payer: Self-pay

## 2016-11-21 ENCOUNTER — Telehealth: Payer: Self-pay | Admitting: Cardiology

## 2016-11-21 MED ORDER — AMLODIPINE BESYLATE 5 MG PO TABS: 5.0000 mg | ORAL_TABLET | Freq: Every day | ORAL | 6 refills | Status: DC

## 2016-11-21 NOTE — Telephone Encounter (Signed)
Returning your call,concerning his lab results. °

## 2016-11-21 NOTE — Telephone Encounter (Signed)
Returned call to patient lab results given. 

## 2017-02-12 NOTE — Progress Notes (Signed)
Cardiology Office Note   Date:  02/15/2017   ID:  Scott Wilkins, DOB Jan 29, 1941, MRN [id number]  PCP:  Kirby FunkGriffin, John, MD  Cardiologist:  Dr. SwazilandJordan    Chief Complaint  Patient presents with  . Follow-up    No chest pain, shortness of breath, edema, lightheaded or dizziness and has cramping in ltegs at nigh,   . Coronary Artery Disease      History of Present Illness: Scott Wilkins is a 76 y.o. male who is seen for follow up CAD.  He was admitted July 04, 2014 with  lateral ST elevation MI. He underwent emergent cath and received a drug-eluting stent Promus Premier to the proximal circumflex. He also had POBA of prox. diagonal 1. He also had severe disease in the distal LAD that was too small for PCI. His EF remained fairly normal 50-55%. He is diabetic.   On follow up today he continues to do well. He denies any chest pain or SOB. Reports sugars are fair. He stays active doing yard work but doesn't really do any exercise.  No other new health problems.     Past Medical History:  Diagnosis Date  . CAD (coronary artery disease)    a. 06/2014 Lateral STEMI/PCI: LM nl, LAD 90d, small, D1 95 (PTCA only), LCX 90p (3.5 x 16 Promus DES), RCA dominant, 5536m, RPDA 7293m.  . CKD (chronic kidney disease), stage III   . Diabetes mellitus   . Hyperlipidemia   . Hypertension   . Rosacea   . Seizures (HCC)    h/o - none in years    Past Surgical History:  Procedure Laterality Date  . CRANIOTOMY  1948   left craniectomy after head trauma  . LEFT HEART CATHETERIZATION WITH CORONARY ANGIOGRAM N/A 07/04/2014   Procedure: LEFT HEART CATHETERIZATION WITH CORONARY ANGIOGRAM;  Surgeon: Rogers Ditter M SwazilandJordan, MD;  [facility];  Service: Cardiovascular;  Laterality: N/A;  . PERCUTANEOUS CORONARY STENT INTERVENTION (PCI-S)  07/04/2014   Procedure: PERCUTANEOUS CORONARY STENT INTERVENTION (PCI-S);  Surgeon: Jalayla Chrismer M SwazilandJordan, MD;  [facility];  Service: Cardiovascular;;  prox CFX  .  RIB FRACTURE SURGERY  1976   ORIF after frcture due to trauma     Current Outpatient Prescriptions  Medication Sig Dispense Refill  . aspirin 81 MG chewable tablet Chew 1 tablet (81 mg total) by mouth daily.    Marland Kitchen. atorvastatin (LIPITOR) 80 MG tablet Take 1 tablet (80 mg total) by mouth daily at 6 PM. 90 tablet 3  . carvedilol (COREG) 3.125 MG tablet Take 3.125 mg by mouth 2 (two) times daily with a meal.     . glimepiride (AMARYL) 4 MG tablet TAKE ONE-HALF TABLET BY MOUTH TWICE DAILY (Patient taking differently: Take 2 mg by mouth 2 (two) times daily. ) 90 tablet 0  . Insulin Detemir (LEVEMIR) 100 UNIT/ML Pen Inject 25 Units into the skin at bedtime.    Marland Kitchen. lisinopril (PRINIVIL,ZESTRIL) 2.5 MG tablet Take 2.5 mg by mouth daily.    . metFORMIN (GLUCOPHAGE) 1000 MG tablet Take 500 mg by mouth 2 (two) times daily.    . nitroGLYCERIN (NITROSTAT) 0.4 MG SL tablet Place 1 tablet (0.4 mg total) under the tongue every 5 (five) minutes x 3 doses as needed for chest pain. 25 tablet 2  . OVER THE COUNTER MEDICATION Take 2 strips by mouth daily as needed (gas/ flatulence). Gas x strips     No current facility-administered medications for this visit.  Allergies:   Patient has no known allergies.    Social History:  The patient  reports that he has never smoked. He has never used smokeless tobacco. He reports that he does not drink alcohol or use drugs.   Family History:  The patient's family history includes Heart disease in his brother, brother, father, and mother.    ROS:  As noted in HPI. All other systems are reviewed and are otherwise normal.   Wt Readings from Last 3 Encounters:  02/15/17 166 lb (75.3 kg)  11/03/16 167 lb 3.2 oz (75.8 kg)  02/26/15 160 lb (72.6 kg)     PHYSICAL EXAM: VS:  BP 126/72   Pulse 66   Ht 5\' 6"  (1.676 m)   Wt 166 lb (75.3 kg)   BMI 26.79 kg/m  , BMI Body mass index is 26.79 kg/m. General:Pleasant affect, NAD Skin:Warm and dry, brisk capillary  refill HEENT:normocephalic, sclera clear, mucus membranes moist Neck:supple, no JVD, no bruits  Heart:S1S2 RRR without murmur, gallop, rub or click Lungs:clear without rales, rhonchi, or wheezes ONG:EXBM, non tender, + BS, do not palpate liver spleen or masses Ext:no lower ext edema, 2+ pedal pulses, 2+ radial pulses Neuro:alert and oriented, MAE, follows commands, + facial symmetry     Recent Labs: 11/03/2016: ALT 20 11/17/2016: BUN 29; Creat 1.88; Potassium 4.4; Sodium 139    Lipid Panel    Component Value Date/Time   CHOL 120 11/03/2016 1212   TRIG 106 11/03/2016 1212   HDL 47 11/03/2016 1212   CHOLHDL 2.6 11/03/2016 1212   CHOLHDL 2.9 07/07/2014 0506   VLDL 25 07/07/2014 0506   LDLCALC 52 11/03/2016 1212   LDLDIRECT 126.0 08/22/2008 1610    Labs dated 06/25/15: cholesterol 122, triglycerides 126, HDL 46, LDL 51. BUN 25, creatinine 1.56. CMET otherwise normal  Dated 10/18/16 A1c 7.8%   ASSESSMENT AND PLAN:  1.  CAD s/p  STEMI of lateral wall with placement of Promus Premier drug-eluting stent to the circumflex and POBA to diagonal 1 in January 2016. He is on aspirin, beta blocker, and ACE inhibitor. He is asymptomatic. Encourage increased aerobic activity.  2. Hyperlipidemia with target LDL less than 70. Excellent control on lipitor.  3. Essential hypertension- BP is well controlled. We reviewed his medication list. He is no longer on amlodipine. Did not increase lisinopril to 5 mg daily but BP well controlled so will continue for now.   4 Diabetes mellitus type 2 in nonobese followed by Dr. Valentina Lucks. On metformin and amaryl.    I will follow up in one year.

## 2017-02-15 ENCOUNTER — Encounter: Payer: Self-pay | Admitting: Cardiology

## 2017-02-15 ENCOUNTER — Ambulatory Visit (INDEPENDENT_AMBULATORY_CARE_PROVIDER_SITE_OTHER): Payer: Medicare Other | Admitting: Cardiology

## 2017-02-15 VITALS — BP 126/72 | HR 66 | Ht 66.0 in | Wt 166.0 lb

## 2017-02-15 DIAGNOSIS — I10 Essential (primary) hypertension: Secondary | ICD-10-CM | POA: Diagnosis not present

## 2017-02-15 DIAGNOSIS — E119 Type 2 diabetes mellitus without complications: Secondary | ICD-10-CM

## 2017-02-15 DIAGNOSIS — E785 Hyperlipidemia, unspecified: Secondary | ICD-10-CM

## 2017-02-15 DIAGNOSIS — I2510 Atherosclerotic heart disease of native coronary artery without angina pectoris: Secondary | ICD-10-CM | POA: Diagnosis not present

## 2017-02-15 DIAGNOSIS — Z9861 Coronary angioplasty status: Secondary | ICD-10-CM

## 2017-02-15 NOTE — Patient Instructions (Addendum)
Continue your current medication  You need to increase your aerobic activity  I will see you in one year

## 2017-04-27 DIAGNOSIS — E7800 Pure hypercholesterolemia, unspecified: Secondary | ICD-10-CM | POA: Diagnosis not present

## 2017-04-27 DIAGNOSIS — N183 Chronic kidney disease, stage 3 (moderate): Secondary | ICD-10-CM | POA: Diagnosis not present

## 2017-04-27 DIAGNOSIS — Z7984 Long term (current) use of oral hypoglycemic drugs: Secondary | ICD-10-CM | POA: Diagnosis not present

## 2017-04-27 DIAGNOSIS — I129 Hypertensive chronic kidney disease with stage 1 through stage 4 chronic kidney disease, or unspecified chronic kidney disease: Secondary | ICD-10-CM | POA: Diagnosis not present

## 2017-04-27 DIAGNOSIS — Z0000 Encounter for general adult medical examination without abnormal findings: Secondary | ICD-10-CM | POA: Diagnosis not present

## 2017-04-27 DIAGNOSIS — Z1389 Encounter for screening for other disorder: Secondary | ICD-10-CM | POA: Diagnosis not present

## 2017-04-27 DIAGNOSIS — E1122 Type 2 diabetes mellitus with diabetic chronic kidney disease: Secondary | ICD-10-CM | POA: Diagnosis not present

## 2017-06-21 ENCOUNTER — Other Ambulatory Visit: Payer: Self-pay | Admitting: Cardiology

## 2017-06-21 NOTE — Telephone Encounter (Signed)
°*  STAT* If patient is at the pharmacy, call can be transferred to refill team.   1. Which medications need to be refilled? (please list name of each medication and dose if known) Amlodipine  2. Which pharmacy/location (including street and city if local pharmacy) is medication to be sent to?[facility] [phone]  3. Do they need a 30 day or 90 day supply?90 and refills

## 2017-06-22 ENCOUNTER — Telehealth: Payer: Self-pay | Admitting: Cardiology

## 2017-06-22 ENCOUNTER — Other Ambulatory Visit: Payer: Self-pay | Admitting: Cardiology

## 2017-06-22 MED ORDER — AMLODIPINE BESYLATE 5 MG PO TABS: 5.0000 mg | ORAL_TABLET | Freq: Every day | ORAL | 1 refills | Status: DC

## 2017-06-22 MED ORDER — LISINOPRIL 2.5 MG PO TABS: 2.5000 mg | ORAL_TABLET | Freq: Every day | ORAL | 1 refills | Status: DC

## 2017-06-22 MED ORDER — NITROGLYCERIN 0.4 MG SL SUBL: 0.4000 mg | SUBLINGUAL_TABLET | SUBLINGUAL | 11 refills | Status: AC | PRN

## 2017-06-22 NOTE — Addendum Note (Signed)
Addended by: Armen PickupWYSOR, Caston Coopersmith T on: 06/22/2017 12:52 PM   Modules accepted: Orders

## 2017-06-22 NOTE — Telephone Encounter (Signed)
Returned call to patient's wife husband gave me permission to speak to her.She stated husband has been taking Amlodipine 5 mg daily.Stated he was taking when he saw Dr.Jordan 02/15/17.Stated he needs a refill.Advised I will send refill to pharmacy and I will let Dr.Jordan know.

## 2017-06-22 NOTE — Telephone Encounter (Signed)
LMTCB advised that Amlodipine was discontinued by Dr. SwazilandJordan at last office visit.

## 2017-06-22 NOTE — Telephone Encounter (Signed)
New Message   *STAT* If patient is at the pharmacy, call can be transferred to refill team.   1. Which medications need to be refilled? (please list name of each medication and dose if known) Amlodipine   2. Which pharmacy/location (including street and city if local pharmacy) is medication to be sent to? [facility] [place]   3. Do they need a 30 day or 90 day supply? 90

## 2017-06-22 NOTE — Telephone Encounter (Signed)
New message  Pt  Wife verbalized that she is returning call for RN

## 2017-06-23 ENCOUNTER — Other Ambulatory Visit: Payer: Self-pay

## 2017-06-26 ENCOUNTER — Telehealth: Payer: Self-pay | Admitting: Cardiology

## 2017-06-26 ENCOUNTER — Other Ambulatory Visit: Payer: Self-pay

## 2017-06-26 MED ORDER — ATORVASTATIN CALCIUM 80 MG PO TABS: 80.0000 mg | ORAL_TABLET | Freq: Every day | ORAL | 3 refills | Status: DC

## 2017-06-26 MED ORDER — CARVEDILOL 3.125 MG PO TABS: 3.1250 mg | ORAL_TABLET | Freq: Two times a day (BID) | ORAL | 3 refills | Status: DC

## 2017-06-26 MED ORDER — AMLODIPINE BESYLATE 5 MG PO TABS: 5.0000 mg | ORAL_TABLET | Freq: Every day | ORAL | 3 refills | Status: DC

## 2017-06-26 MED ORDER — LISINOPRIL 2.5 MG PO TABS: 2.5000 mg | ORAL_TABLET | Freq: Every day | ORAL | 3 refills | Status: DC

## 2017-06-26 NOTE — Telephone Encounter (Signed)
New message    Needs new prescriptons due to insurance change  *STAT* If patient is at the pharmacy, call can be transferred to refill team.   1. Which medications need to be refilled? (please list name of each medication and dose if known)  atorvastatin (LIPITOR) 80 MG tablet Take 1 tablet (80 mg total) by mouth daily at 6 PM.   carvedilol (COREG) 3.125 MG tablet   lisinopril (PRINIVIL,ZESTRIL) 2.5 MG tablet  2. Which pharmacy/location (including street and city if local pharmacy) is medication to be sent to?  [facility]  - fax  [phone]   3. Do they need a 30 day or 90 day supply  90 day

## 2017-06-26 NOTE — Telephone Encounter (Signed)
Refill sent to the pharmacy electronically.  

## 2017-06-27 ENCOUNTER — Other Ambulatory Visit: Payer: Self-pay

## 2017-06-27 MED ORDER — AMLODIPINE BESYLATE 5 MG PO TABS: 5.0000 mg | ORAL_TABLET | Freq: Every day | ORAL | 3 refills | Status: DC

## 2017-07-24 ENCOUNTER — Telehealth: Payer: Self-pay | Admitting: Cardiology

## 2017-07-24 DIAGNOSIS — Z5181 Encounter for therapeutic drug level monitoring: Secondary | ICD-10-CM

## 2017-07-24 DIAGNOSIS — N289 Disorder of kidney and ureter, unspecified: Secondary | ICD-10-CM

## 2017-07-24 NOTE — Telephone Encounter (Signed)
New message   Wife calling   Pt c/o medication issue:  1. Name of Medication: lisinopril (PRINIVIL,ZESTRIL) 2.5 MG tablet  2. How are you currently taking this medication (dosage and times per day)? Take 1 tablet (2.5 mg total) by mouth daily.  3. Are you having a reaction (difficulty breathing--STAT)? No   4. What is your medication issue? Need clarification on dosage  5 mg or 2.5 mg

## 2017-07-24 NOTE — Telephone Encounter (Signed)
Spoke wife and patient never stopped his Lisinopril as advised May 2018 after labs. He has been taking the Lisinopril 5 mg daily at all of the follow up visits since. Wife is unsure is patient should be taking and if he should be taking 2.5 or 5 mg tablets. Will forward to Dr SwazilandJordan for review

## 2017-07-24 NOTE — Telephone Encounter (Signed)
I would recommend he stop lisinopril due to effect on renal function. I would like to repeat BMET when off lisinopril for 2 weeks.  Peter SwazilandJordan MD, [facility]

## 2017-07-26 NOTE — Telephone Encounter (Signed)
Advised wife and will send lab order form

## 2017-07-26 NOTE — Telephone Encounter (Signed)
Returned call to patient's wife no answer.LMTC. 

## 2017-08-04 DIAGNOSIS — N289 Disorder of kidney and ureter, unspecified: Secondary | ICD-10-CM | POA: Diagnosis not present

## 2017-08-04 DIAGNOSIS — Z5181 Encounter for therapeutic drug level monitoring: Secondary | ICD-10-CM | POA: Diagnosis not present

## 2017-08-05 LAB — BASIC METABOLIC PANEL
BUN/Creatinine Ratio: 23 (ref 10–24)
BUN: 36 mg/dL — ABNORMAL HIGH (ref 8–27)
CO2: 22 mmol/L (ref 20–29)
Calcium: 9.4 mg/dL (ref 8.6–10.2)
Chloride: 104 mmol/L (ref 96–106)
Creatinine, Ser: 1.57 mg/dL — ABNORMAL HIGH (ref 0.76–1.27)
GFR calc Af Amer: 49 mL/min/{1.73_m2} — ABNORMAL LOW (ref >59)
GFR calc non Af Amer: 43 mL/min/{1.73_m2} — ABNORMAL LOW (ref >59)
Glucose: 214 mg/dL — ABNORMAL HIGH (ref 65–99)
Potassium: 4.4 mmol/L (ref 3.5–5.2)
Sodium: 144 mmol/L (ref 134–144)

## 2017-08-22 DIAGNOSIS — I129 Hypertensive chronic kidney disease with stage 1 through stage 4 chronic kidney disease, or unspecified chronic kidney disease: Secondary | ICD-10-CM | POA: Diagnosis not present

## 2017-08-22 DIAGNOSIS — E1122 Type 2 diabetes mellitus with diabetic chronic kidney disease: Secondary | ICD-10-CM | POA: Diagnosis not present

## 2017-08-22 DIAGNOSIS — Z794 Long term (current) use of insulin: Secondary | ICD-10-CM | POA: Diagnosis not present

## 2017-08-22 DIAGNOSIS — N183 Chronic kidney disease, stage 3 (moderate): Secondary | ICD-10-CM | POA: Diagnosis not present

## 2017-09-18 ENCOUNTER — Ambulatory Visit: Payer: Medicare Other | Admitting: Cardiology

## 2017-10-06 ENCOUNTER — Encounter: Payer: Self-pay | Admitting: Cardiology

## 2017-10-22 NOTE — Progress Notes (Signed)
Cardiology Office Note   Date:  10/25/2017   ID:  Scott Wilkins, DOB [birth year], MRN [id number]  PCP:  Scott Funk, MD  Cardiologist:  Dr. Swaziland    Chief Complaint  Patient presents with  . Follow-up  . Coronary Artery Disease      History of Present Illness: Scott Wilkins is a 77 y.o. male who is seen for follow up CAD.  He was admitted July 04, 2014 with  lateral ST elevation MI. He underwent emergent cath and received a drug-eluting stent Promus Premier to the proximal circumflex. He also had POBA of prox. diagonal 1. He also had severe disease in the distal LAD that was too small for PCI. His EF remained fairly normal 50-55%. He is diabetic.   On follow up today he continues to do well. He denies any chest pain, dyspnea, palpitations, dizziness. Is fairly inactive in the winter but increasing activity now. Mows his grass with a push mower and splits wood by hand.    Past Medical History:  Diagnosis Date  . CAD (coronary artery disease)    a. 06/2014 Lateral STEMI/PCI: LM nl, LAD 90d, small, D1 95 (PTCA only), LCX 90p (3.5 x 16 Promus DES), RCA dominant, 27m, RPDA 51m.  . CKD (chronic kidney disease), stage III (HCC)   . Diabetes mellitus   . Hyperlipidemia   . Hypertension   . Rosacea   . Seizures (HCC)    h/o - none in years    Past Surgical History:  Procedure Laterality Date  . CRANIOTOMY  1948   left craniectomy after head trauma  . LEFT HEART CATHETERIZATION WITH CORONARY ANGIOGRAM N/A 07/04/2014   Procedure: LEFT HEART CATHETERIZATION WITH CORONARY ANGIOGRAM;  Surgeon: Whalen Trompeter M Swaziland, MD;  Location: [facility] CATH LAB;  Service: Cardiovascular;  Laterality: N/A;  . PERCUTANEOUS CORONARY STENT INTERVENTION (PCI-S)  07/04/2014   Procedure: PERCUTANEOUS CORONARY STENT INTERVENTION (PCI-S);  Surgeon: Arnette Driggs M Swaziland, MD;  Location: [facility] CATH LAB;  Service: Cardiovascular;;  prox CFX  . RIB FRACTURE SURGERY  1976   ORIF after frcture due to trauma     Current  Outpatient Medications  Medication Sig Dispense Refill  . aspirin 81 MG chewable tablet Chew 1 tablet (81 mg total) by mouth daily.    Marland Kitchen atorvastatin (LIPITOR) 80 MG tablet Take 1 tablet (80 mg total) by mouth daily at 6 PM. 90 tablet 3  . carvedilol (COREG) 3.125 MG tablet Take 1 tablet (3.125 mg total) by mouth 2 (two) times daily with a meal. 180 tablet 3  . glimepiride (AMARYL) 4 MG tablet TAKE ONE-HALF TABLET BY MOUTH TWICE DAILY (Patient taking differently: Take 2 mg by mouth 2 (two) times daily. ) 90 tablet 0  . metFORMIN (GLUCOPHAGE) 1000 MG tablet Take 500 mg by mouth 2 (two) times daily.    . nitroGLYCERIN (NITROSTAT) 0.4 MG SL tablet Place 1 tablet (0.4 mg total) under the tongue every 5 (five) minutes x 3 doses as needed for chest pain. 25 tablet 11  . NOVOLIN 70/30 FLEXPEN RELION (70-30) 100 UNIT/ML PEN Inject into the skin 2 (two) times daily.  3  . OVER THE COUNTER MEDICATION Take 2 strips by mouth daily as needed (gas/ flatulence). Gas x strips    . amLODipine (NORVASC) 5 MG tablet Take 1 tablet (5 mg total) by mouth daily. 30 tablet 3   No current facility-administered medications for this visit.     Allergies:   Patient has no known  allergies.    Social History:  The patient  reports that he has never smoked. He has never used smokeless tobacco. He reports that he does not drink alcohol or use drugs.   Family History:  The patient's family history includes Heart disease in his brother, brother, father, and mother.    ROS:  As noted in HPI. All other systems are reviewed and are otherwise normal.   Wt Readings from Last 3 Encounters:  10/25/17 173 lb (78.5 kg)  02/15/17 166 lb (75.3 kg)  11/03/16 167 lb 3.2 oz (75.8 kg)     PHYSICAL EXAM: VS:  BP 132/70 (BP Location: Right Arm, Cuff Size: Normal)   Pulse 67   Ht  (1.676 m)   Wt 173 lb (78.5 kg)   BMI 27.92 kg/m  , BMI Body mass index is 27.92 kg/m. GENERAL:  Well appearing WM in NAD HEENT:  PERRL, EOMI,  sclera are clear. Oropharynx is clear. NECK:  No jugular venous distention, carotid upstroke brisk and symmetric, no bruits, no thyromegaly or adenopathy LUNGS:  Clear to auscultation bilaterally CHEST:  Unremarkable HEART:  RRR,  PMI not displaced or sustained,S1 and S2 within normal limits, no S3, no S4: no clicks, no rubs, no murmurs ABD:  Soft, nontender. BS +, no masses or bruits. No hepatomegaly, no splenomegaly EXT:  2 + pulses throughout, no edema, no cyanosis no clubbing SKIN:  Warm and dry.  No rashes NEURO:  Alert and oriented x 3. Cranial nerves II through XII intact. PSYCH:  Cognitively intact    Recent Labs: 11/03/2016: ALT 20 08/04/2017: BUN 36; Creatinine, Ser 1.57; Potassium 4.4; Sodium 144    Lipid Panel    Component Value Date/Time   CHOL 120 11/03/2016 1212   TRIG 106 11/03/2016 1212   HDL 47 11/03/2016 1212   CHOLHDL 2.6 11/03/2016 1212   CHOLHDL 2.9 07/07/2014 0506   VLDL 25 07/07/2014 0506   LDLCALC 52 11/03/2016 1212   LDLDIRECT 126.0 08/22/2008 1610    Labs dated 06/25/15: cholesterol 122, triglycerides 126, HDL 46, LDL 51. BUN 25, creatinine 1.56. CMET otherwise normal  Dated 10/18/16 A1c 7.8% Dated 04/27/17: cholesterol 138, triglycerides 178, HDL 52, LDL 51.  Dated 08/22/17: A1c 8.5%.  Ecg today shows NSR with rate 67. Normal Ecg. I have personally reviewed and interpreted this study.    ASSESSMENT AND PLAN:  1.  CAD s/p  STEMI of lateral wall with placement of Promus Premier drug-eluting stent to the circumflex and POBA to diagonal 1 in January 2016. He is on aspirin, beta blocker, and amlodipine. He is asymptomatic.   2. Hyperlipidemia with target LDL less than 70. Excellent control on lipitor.  3. Essential hypertension- BP is well controlled.  4 Diabetes mellitus on long term insulin. A1c 8.5%. Management per primary care.. Encouraged increased aerobic activity and weight loss.   I will follow up in one year.

## 2017-10-25 ENCOUNTER — Encounter: Payer: Self-pay | Admitting: Cardiology

## 2017-10-25 ENCOUNTER — Ambulatory Visit: Payer: Medicare Other | Admitting: Cardiology

## 2017-10-25 ENCOUNTER — Ambulatory Visit: Payer: Medicare HMO | Admitting: Cardiology

## 2017-10-25 VITALS — BP 132/70 | HR 67 | Ht 66.0 in | Wt 173.0 lb

## 2017-10-25 DIAGNOSIS — I10 Essential (primary) hypertension: Secondary | ICD-10-CM | POA: Diagnosis not present

## 2017-10-25 DIAGNOSIS — Z9861 Coronary angioplasty status: Secondary | ICD-10-CM

## 2017-10-25 DIAGNOSIS — I2510 Atherosclerotic heart disease of native coronary artery without angina pectoris: Secondary | ICD-10-CM

## 2017-10-25 DIAGNOSIS — E119 Type 2 diabetes mellitus without complications: Secondary | ICD-10-CM | POA: Diagnosis not present

## 2017-10-25 DIAGNOSIS — E785 Hyperlipidemia, unspecified: Secondary | ICD-10-CM | POA: Diagnosis not present

## 2017-10-25 NOTE — Patient Instructions (Addendum)
Continue your current therapy   Focus on increased aerobic activity and weight loss.  I will see you in one year

## 2017-12-19 DIAGNOSIS — E1165 Type 2 diabetes mellitus with hyperglycemia: Secondary | ICD-10-CM | POA: Diagnosis not present

## 2017-12-19 DIAGNOSIS — Z7984 Long term (current) use of oral hypoglycemic drugs: Secondary | ICD-10-CM | POA: Diagnosis not present

## 2017-12-19 DIAGNOSIS — I129 Hypertensive chronic kidney disease with stage 1 through stage 4 chronic kidney disease, or unspecified chronic kidney disease: Secondary | ICD-10-CM | POA: Diagnosis not present

## 2017-12-19 DIAGNOSIS — N183 Chronic kidney disease, stage 3 (moderate): Secondary | ICD-10-CM | POA: Diagnosis not present

## 2017-12-19 DIAGNOSIS — E1122 Type 2 diabetes mellitus with diabetic chronic kidney disease: Secondary | ICD-10-CM | POA: Diagnosis not present

## 2018-04-10 DIAGNOSIS — H2513 Age-related nuclear cataract, bilateral: Secondary | ICD-10-CM | POA: Diagnosis not present

## 2018-04-10 DIAGNOSIS — E119 Type 2 diabetes mellitus without complications: Secondary | ICD-10-CM | POA: Diagnosis not present

## 2018-04-10 DIAGNOSIS — H52203 Unspecified astigmatism, bilateral: Secondary | ICD-10-CM | POA: Diagnosis not present

## 2018-05-04 DIAGNOSIS — N183 Chronic kidney disease, stage 3 (moderate): Secondary | ICD-10-CM | POA: Diagnosis not present

## 2018-05-04 DIAGNOSIS — I129 Hypertensive chronic kidney disease with stage 1 through stage 4 chronic kidney disease, or unspecified chronic kidney disease: Secondary | ICD-10-CM | POA: Diagnosis not present

## 2018-05-04 DIAGNOSIS — N401 Enlarged prostate with lower urinary tract symptoms: Secondary | ICD-10-CM | POA: Diagnosis not present

## 2018-05-04 DIAGNOSIS — Z1389 Encounter for screening for other disorder: Secondary | ICD-10-CM | POA: Diagnosis not present

## 2018-05-04 DIAGNOSIS — Z0000 Encounter for general adult medical examination without abnormal findings: Secondary | ICD-10-CM | POA: Diagnosis not present

## 2018-05-04 DIAGNOSIS — R3911 Hesitancy of micturition: Secondary | ICD-10-CM | POA: Diagnosis not present

## 2018-05-04 DIAGNOSIS — I2510 Atherosclerotic heart disease of native coronary artery without angina pectoris: Secondary | ICD-10-CM | POA: Diagnosis not present

## 2018-05-04 DIAGNOSIS — E7800 Pure hypercholesterolemia, unspecified: Secondary | ICD-10-CM | POA: Diagnosis not present

## 2018-05-04 DIAGNOSIS — M1A00X0 Idiopathic chronic gout, unspecified site, without tophus (tophi): Secondary | ICD-10-CM | POA: Diagnosis not present

## 2018-05-04 DIAGNOSIS — E1122 Type 2 diabetes mellitus with diabetic chronic kidney disease: Secondary | ICD-10-CM | POA: Diagnosis not present

## 2018-05-04 DIAGNOSIS — Z794 Long term (current) use of insulin: Secondary | ICD-10-CM | POA: Diagnosis not present

## 2018-06-27 ENCOUNTER — Other Ambulatory Visit: Payer: Self-pay | Admitting: Cardiology

## 2018-06-27 NOTE — Telephone Encounter (Signed)
Rx request sent to pharmacy.  

## 2018-07-04 ENCOUNTER — Other Ambulatory Visit: Payer: Self-pay | Admitting: Cardiology

## 2018-07-31 ENCOUNTER — Other Ambulatory Visit: Payer: Self-pay | Admitting: Cardiology

## 2018-10-15 ENCOUNTER — Telehealth: Payer: Self-pay | Admitting: *Deleted

## 2018-10-15 NOTE — Telephone Encounter (Signed)
10/15/18 LMOM @ 11:21 am.re: follow up appointment.

## 2018-11-26 DIAGNOSIS — M79605 Pain in left leg: Secondary | ICD-10-CM | POA: Diagnosis not present

## 2018-11-28 DIAGNOSIS — M25552 Pain in left hip: Secondary | ICD-10-CM | POA: Diagnosis not present

## 2018-11-28 DIAGNOSIS — M545 Low back pain, unspecified: Secondary | ICD-10-CM | POA: Insufficient documentation

## 2018-11-28 DIAGNOSIS — M47817 Spondylosis without myelopathy or radiculopathy, lumbosacral region: Secondary | ICD-10-CM | POA: Diagnosis not present

## 2018-12-27 DIAGNOSIS — N401 Enlarged prostate with lower urinary tract symptoms: Secondary | ICD-10-CM | POA: Diagnosis not present

## 2018-12-27 DIAGNOSIS — E1122 Type 2 diabetes mellitus with diabetic chronic kidney disease: Secondary | ICD-10-CM | POA: Diagnosis not present

## 2018-12-27 DIAGNOSIS — M79605 Pain in left leg: Secondary | ICD-10-CM | POA: Diagnosis not present

## 2018-12-27 DIAGNOSIS — Z794 Long term (current) use of insulin: Secondary | ICD-10-CM | POA: Diagnosis not present

## 2018-12-27 DIAGNOSIS — N183 Chronic kidney disease, stage 3 (moderate): Secondary | ICD-10-CM | POA: Diagnosis not present

## 2018-12-27 DIAGNOSIS — Z7984 Long term (current) use of oral hypoglycemic drugs: Secondary | ICD-10-CM | POA: Diagnosis not present

## 2018-12-27 DIAGNOSIS — I129 Hypertensive chronic kidney disease with stage 1 through stage 4 chronic kidney disease, or unspecified chronic kidney disease: Secondary | ICD-10-CM | POA: Diagnosis not present

## 2019-01-21 ENCOUNTER — Other Ambulatory Visit: Payer: Self-pay

## 2019-01-21 NOTE — Telephone Encounter (Signed)
Pt overdue for 12 month f/u. Please contact pt for future appointment. Pt needing refills. 

## 2019-03-13 ENCOUNTER — Telehealth: Payer: Self-pay | Admitting: Cardiology

## 2019-03-13 NOTE — Telephone Encounter (Signed)
°*  STAT* If patient is at the pharmacy, call can be transferred to refill team.   1. Which medications need to be refilled? (please list name of each medication and dose if known)   lisinopril (PRINIVIL,ZESTRIL) 2.5 MG tablet     2. Which pharmacy/location (including street and city if local pharmacy) is medication to be sent to? [place], [place]  3. Do they need a 30 day or 90 day supply? 90 day

## 2019-03-18 NOTE — Telephone Encounter (Signed)
Patient notified. Refill request refused.

## 2019-03-18 NOTE — Telephone Encounter (Signed)
We have not seen since May 2019. He was not on lisinopril then. He has CKD and should not take this medication. Don't know where this was started.  Sam Overbeck Martin[place] MD, [facility]

## 2019-03-18 NOTE — Telephone Encounter (Signed)
See refill request below. Per telephone encounter, 07/2017, it was recommended to stop medication and follow-up with BMP 2 weeks after discontinuation.   Called patient. He reports that he never stopped this medication.   Please advise.

## 2019-03-19 NOTE — Telephone Encounter (Signed)
Spoke to patient he will keep appointment already scheduled with Dr.Jordan 04/16/19 at 10:00 am.

## 2019-04-12 NOTE — Progress Notes (Signed)
Cardiology Office Note   Date:  04/16/2019   ID:  [name], [name] 08/07/1940, MRN [id number]  PCP:  Scott Funk, MD  Cardiologist:  Scott. Sw[place]    Chief Complaint  Patient presents with   Coronary Artery Disease      History of Present Illness: Scott Wilkins is a 78 y.o. male who is seen for follow up CAD.  He was admitted July 04, 2014 with  lateral ST elevation MI. He underwent emergent cath and received a drug-eluting stent Promus Premier to the proximal circumflex. He also had POBA of prox. diagonal 1. He also had severe disease in the distal LAD that was too small for PCI. His EF remained fairly normal 50-55%. He is diabetic.   On follow up today he continues to do well. He denies any chest pain, dyspnea, palpitations, dizziness. Is fairly inactive other than doing some yard work.      Past Medical History:  Diagnosis Date   CAD (coronary artery disease)    a. 06/2014 Lateral STEMI/PCI: LM nl, LAD 90d, small, D1 95 (PTCA only), LCX 90p (3.5 x 16 Promus DES), RCA dominant, 42m, RPDA 67m.   CKD (chronic kidney disease), stage III    Diabetes mellitus    Hyperlipidemia    Hypertension    Rosacea    Seizures (HCC)    h/o - none in years    Past Surgical History:  Procedure Laterality Date   CRANIOTOMY  1948   left craniectomy after head trauma   LEFT HEART CATHETERIZATION WITH CORONARY ANGIOGRAM N/A 07/04/2014   Procedure: LEFT HEART CATHETERIZATION WITH CORONARY ANGIOGRAM;  Surgeon: Scott Arboleda M Swazil[place], MD;  Location: [facility] CATH LAB;  Service: Cardiovascular;  Laterality: N/A;   PERCUTANEOUS CORONARY STENT INTERVENTION (PCI-S)  07/04/2014   Procedure: PERCUTANEOUS CORONARY STENT INTERVENTION (PCI-S);  Surgeon: Scott Nay M [place]azil[place], MD;  Location: [facility] CATH LAB;  Service: Cardiovascular;;  prox CFX   RIB FRACTURE SURGERY  1976   ORIF after frcture due to trauma     Current Outpatient Medications  Medication Sig Dispense Refill   aspirin 81 MG chewable  tablet Chew 1 tablet (81 mg total) by mouth daily.     atorvastatin (LIPITOR) 80 MG tablet TAKE 1 TABLET EVERY DAY  AT  6  PM 90 tablet 3   carvedilol (COREG) 3.125 MG tablet TAKE 1 TABLET (3.125 MG TOTAL) 2 TIMES DAILY WITH A MEAL. 180 tablet 3   glimepiride (AMARYL) 4 MG tablet TAKE ONE-HALF TABLET BY MOUTH TWICE DAILY (Patient taking differently: Take 2 mg by mouth 2 (two) times daily. ) 90 tablet 0   metFORMIN (GLUCOPHAGE) 1000 MG tablet Take 500 mg by mouth 2 (two) times daily.     nitroGLYCERIN (NITROSTAT) 0.4 MG SL tablet Place 1 tablet (0.4 mg total) under the tongue every 5 (five) minutes x 3 doses as needed for chest pain. 25 tablet 11   NOVOLIN 70/30 FLEXPEN RELION (70-30) 100 UNIT/ML PEN Inject into the skin 2 (two) times daily.  3   OVER THE COUNTER MEDICATION Take 2 strips by mouth daily as needed (gas/ flatulence). Gas x strips     tamsulosin (FLOMAX) 0.4 MG CAPS capsule      No current facility-administered medications for this visit.     Allergies:   Patient has no known allergies.    Social History:  The patient  reports that he has never smoked. He has never used smokeless tobacco. He reports that he does  not drink alcohol or use drugs.   Family History:  The patient's family history includes Heart disease in his brother, brother, father, and mother.    ROS:  As noted in HPI. All other systems are reviewed and are otherwise normal.   Wt Readings from Last 3 Encounters:  04/16/19 174 lb (78.9 kg)  10/25/17 173 lb (78.5 kg)  02/15/17 166 lb (75.3 kg)     PHYSICAL EXAM: VS:  BP 128/70 (BP Location: Left Arm)    Pulse 63    Ht 5\' 5"  (1.651 m)    Wt 174 lb (78.9 kg)    SpO2 94%    BMI 28.96 kg/m  , BMI Body mass index is 28.96 kg/m. GENERAL:  Well appearing WM in NAD HEENT:  PERRL, EOMI, sclera are clear. Oropharynx is clear. NECK:  No jugular venous distention, carotid upstroke brisk and symmetric, no bruits, no thyromegaly or adenopathy LUNGS:  Clear to  auscultation bilaterally CHEST:  Unremarkable HEART:  RRR,  PMI not displaced or sustained,S1 and S2 within normal limits, no S3, no S4: no clicks, no rubs, no murmurs ABD:  Soft, nontender. BS +, no masses or bruits. No hepatomegaly, no splenomegaly EXT:  2 + pulses throughout, no edema, no cyanosis no clubbing SKIN:  Warm and dry.  No rashes NEURO:  Alert and oriented x 3. Cranial nerves II through XII intact. PSYCH:  Cognitively intact    Recent Labs: No results found for requested labs within last 8760 hours.    Lipid Panel    Component Value Date/Time   CHOL 120 11/03/2016 1212   TRIG 106 11/03/2016 1212   HDL 47 11/03/2016 1212   CHOLHDL 2.6 11/03/2016 1212   CHOLHDL 2.9 07/07/2014 0506   VLDL 25 07/07/2014 0506   LDLCALC 52 11/03/2016 1212   LDLDIRECT 126.0 08/22/2008 1610    Labs dated 06/25/15: cholesterol 122, triglycerides 126, HDL 46, LDL 51. BUN 25, creatinine 1.56. CMET otherwise normal  Dated 10/18/16 A1c 7.8% Dated 04/27/17: cholesterol 138, triglycerides 178, HDL 52, LDL 51.  Dated 08/22/17: A1c 8.5%. Dated 05/04/18: cholesterol 138, triglycerides 112, HDL 50, LDL 52. Creatinine 1.77. otherwise CMET normal Dated 12/27/18: A1c 8.6%  Ecg today shows NSR with rate 63. Normal Ecg. I have personally reviewed and interpreted this study.    ASSESSMENT AND PLAN:  1.  CAD s/p  STEMI of lateral wall with placement of Promus Premier drug-eluting stent to the circumflex and POBA to diagonal 1 in January 2016. He has no significant angina. He is on aspirin, beta blocker, and statin.  2. Hyperlipidemia with target LDL less than 70. Excellent control on lipitor. He is due for follow up lab work with Scott Wilkins.   3. Essential hypertension- BP is well controlled.  4  Diabetes mellitus on long term insulin. A1c 8.6%. Management per primary care.. Encouraged increased aerobic activity and weight loss.   I will follow up in one year.

## 2019-04-16 ENCOUNTER — Encounter: Payer: Self-pay | Admitting: Cardiology

## 2019-04-16 ENCOUNTER — Other Ambulatory Visit: Payer: Self-pay

## 2019-04-16 ENCOUNTER — Ambulatory Visit (INDEPENDENT_AMBULATORY_CARE_PROVIDER_SITE_OTHER): Payer: Medicare HMO | Admitting: Cardiology

## 2019-04-16 VITALS — BP 128/70 | HR 63 | Ht 65.0 in | Wt 174.0 lb

## 2019-04-16 DIAGNOSIS — E785 Hyperlipidemia, unspecified: Secondary | ICD-10-CM

## 2019-04-16 DIAGNOSIS — I10 Essential (primary) hypertension: Secondary | ICD-10-CM

## 2019-04-16 DIAGNOSIS — I2510 Atherosclerotic heart disease of native coronary artery without angina pectoris: Secondary | ICD-10-CM | POA: Diagnosis not present

## 2019-04-16 NOTE — Addendum Note (Signed)
Addended by: PUGH, CHERYL J on: 04/16/2019 10:54 AM   Modules accepted: Orders  

## 2019-05-07 DIAGNOSIS — N183 Chronic kidney disease, stage 3 unspecified: Secondary | ICD-10-CM | POA: Diagnosis not present

## 2019-05-07 DIAGNOSIS — I2510 Atherosclerotic heart disease of native coronary artery without angina pectoris: Secondary | ICD-10-CM | POA: Diagnosis not present

## 2019-05-07 DIAGNOSIS — N401 Enlarged prostate with lower urinary tract symptoms: Secondary | ICD-10-CM | POA: Diagnosis not present

## 2019-05-07 DIAGNOSIS — I129 Hypertensive chronic kidney disease with stage 1 through stage 4 chronic kidney disease, or unspecified chronic kidney disease: Secondary | ICD-10-CM | POA: Diagnosis not present

## 2019-05-07 DIAGNOSIS — I252 Old myocardial infarction: Secondary | ICD-10-CM | POA: Diagnosis not present

## 2019-05-07 DIAGNOSIS — E7800 Pure hypercholesterolemia, unspecified: Secondary | ICD-10-CM | POA: Diagnosis not present

## 2019-05-07 DIAGNOSIS — M1812 Unilateral primary osteoarthritis of first carpometacarpal joint, left hand: Secondary | ICD-10-CM | POA: Diagnosis not present

## 2019-05-07 DIAGNOSIS — Z794 Long term (current) use of insulin: Secondary | ICD-10-CM | POA: Diagnosis not present

## 2019-05-07 DIAGNOSIS — E1122 Type 2 diabetes mellitus with diabetic chronic kidney disease: Secondary | ICD-10-CM | POA: Diagnosis not present

## 2019-05-10 ENCOUNTER — Other Ambulatory Visit: Payer: Self-pay | Admitting: Cardiology

## 2019-05-16 DIAGNOSIS — Z794 Long term (current) use of insulin: Secondary | ICD-10-CM | POA: Diagnosis not present

## 2019-05-16 DIAGNOSIS — E7800 Pure hypercholesterolemia, unspecified: Secondary | ICD-10-CM | POA: Diagnosis not present

## 2019-05-16 DIAGNOSIS — I2510 Atherosclerotic heart disease of native coronary artery without angina pectoris: Secondary | ICD-10-CM | POA: Diagnosis not present

## 2019-05-16 DIAGNOSIS — Z1389 Encounter for screening for other disorder: Secondary | ICD-10-CM | POA: Diagnosis not present

## 2019-05-16 DIAGNOSIS — N1832 Chronic kidney disease, stage 3b: Secondary | ICD-10-CM | POA: Diagnosis not present

## 2019-05-16 DIAGNOSIS — E1122 Type 2 diabetes mellitus with diabetic chronic kidney disease: Secondary | ICD-10-CM | POA: Diagnosis not present

## 2019-05-16 DIAGNOSIS — I129 Hypertensive chronic kidney disease with stage 1 through stage 4 chronic kidney disease, or unspecified chronic kidney disease: Secondary | ICD-10-CM | POA: Diagnosis not present

## 2019-05-16 DIAGNOSIS — N401 Enlarged prostate with lower urinary tract symptoms: Secondary | ICD-10-CM | POA: Diagnosis not present

## 2019-05-16 DIAGNOSIS — Z0000 Encounter for general adult medical examination without abnormal findings: Secondary | ICD-10-CM | POA: Diagnosis not present

## 2019-05-16 LAB — COMPREHENSIVE METABOLIC PANEL: GFR calc non Af Amer: 43

## 2019-05-21 ENCOUNTER — Other Ambulatory Visit: Payer: Self-pay | Admitting: *Deleted

## 2019-05-21 MED ORDER — ATORVASTATIN CALCIUM 80 MG PO TABS: 80.0000 mg | ORAL_TABLET | Freq: Every day | ORAL | 2 refills | Status: DC

## 2019-05-22 ENCOUNTER — Other Ambulatory Visit: Payer: Self-pay

## 2019-05-22 MED ORDER — ATORVASTATIN CALCIUM 80 MG PO TABS: 80.0000 mg | ORAL_TABLET | Freq: Every day | ORAL | 3 refills | Status: DC

## 2019-07-09 DIAGNOSIS — I2510 Atherosclerotic heart disease of native coronary artery without angina pectoris: Secondary | ICD-10-CM | POA: Diagnosis not present

## 2019-07-09 DIAGNOSIS — N401 Enlarged prostate with lower urinary tract symptoms: Secondary | ICD-10-CM | POA: Diagnosis not present

## 2019-07-09 DIAGNOSIS — E7800 Pure hypercholesterolemia, unspecified: Secondary | ICD-10-CM | POA: Diagnosis not present

## 2019-07-09 DIAGNOSIS — M1812 Unilateral primary osteoarthritis of first carpometacarpal joint, left hand: Secondary | ICD-10-CM | POA: Diagnosis not present

## 2019-07-09 DIAGNOSIS — I129 Hypertensive chronic kidney disease with stage 1 through stage 4 chronic kidney disease, or unspecified chronic kidney disease: Secondary | ICD-10-CM | POA: Diagnosis not present

## 2019-07-09 DIAGNOSIS — E1122 Type 2 diabetes mellitus with diabetic chronic kidney disease: Secondary | ICD-10-CM | POA: Diagnosis not present

## 2019-07-09 DIAGNOSIS — I252 Old myocardial infarction: Secondary | ICD-10-CM | POA: Diagnosis not present

## 2019-07-09 DIAGNOSIS — N183 Chronic kidney disease, stage 3 unspecified: Secondary | ICD-10-CM | POA: Diagnosis not present

## 2019-07-16 DIAGNOSIS — E1122 Type 2 diabetes mellitus with diabetic chronic kidney disease: Secondary | ICD-10-CM | POA: Diagnosis not present

## 2019-07-16 DIAGNOSIS — E7800 Pure hypercholesterolemia, unspecified: Secondary | ICD-10-CM | POA: Diagnosis not present

## 2019-07-16 DIAGNOSIS — I252 Old myocardial infarction: Secondary | ICD-10-CM | POA: Diagnosis not present

## 2019-07-16 DIAGNOSIS — M1812 Unilateral primary osteoarthritis of first carpometacarpal joint, left hand: Secondary | ICD-10-CM | POA: Diagnosis not present

## 2019-07-16 DIAGNOSIS — N401 Enlarged prostate with lower urinary tract symptoms: Secondary | ICD-10-CM | POA: Diagnosis not present

## 2019-07-16 DIAGNOSIS — I2510 Atherosclerotic heart disease of native coronary artery without angina pectoris: Secondary | ICD-10-CM | POA: Diagnosis not present

## 2019-07-16 DIAGNOSIS — I129 Hypertensive chronic kidney disease with stage 1 through stage 4 chronic kidney disease, or unspecified chronic kidney disease: Secondary | ICD-10-CM | POA: Diagnosis not present

## 2019-07-16 DIAGNOSIS — N183 Chronic kidney disease, stage 3 unspecified: Secondary | ICD-10-CM | POA: Diagnosis not present

## 2019-09-17 DIAGNOSIS — N1832 Chronic kidney disease, stage 3b: Secondary | ICD-10-CM | POA: Diagnosis not present

## 2019-09-17 DIAGNOSIS — E162 Hypoglycemia, unspecified: Secondary | ICD-10-CM | POA: Diagnosis not present

## 2019-09-17 DIAGNOSIS — E1122 Type 2 diabetes mellitus with diabetic chronic kidney disease: Secondary | ICD-10-CM | POA: Diagnosis not present

## 2019-09-17 DIAGNOSIS — N401 Enlarged prostate with lower urinary tract symptoms: Secondary | ICD-10-CM | POA: Diagnosis not present

## 2019-09-17 DIAGNOSIS — Z7984 Long term (current) use of oral hypoglycemic drugs: Secondary | ICD-10-CM | POA: Diagnosis not present

## 2019-09-17 DIAGNOSIS — I129 Hypertensive chronic kidney disease with stage 1 through stage 4 chronic kidney disease, or unspecified chronic kidney disease: Secondary | ICD-10-CM | POA: Diagnosis not present

## 2019-09-17 DIAGNOSIS — Z794 Long term (current) use of insulin: Secondary | ICD-10-CM | POA: Diagnosis not present

## 2019-09-17 LAB — HEMOGLOBIN A1C: Hemoglobin A1C: 7.9

## 2019-11-28 DIAGNOSIS — R400 Somnolence: Secondary | ICD-10-CM | POA: Diagnosis not present

## 2019-11-28 DIAGNOSIS — R519 Headache, unspecified: Secondary | ICD-10-CM | POA: Diagnosis not present

## 2019-11-28 DIAGNOSIS — R413 Other amnesia: Secondary | ICD-10-CM | POA: Diagnosis not present

## 2019-11-28 DIAGNOSIS — Z7984 Long term (current) use of oral hypoglycemic drugs: Secondary | ICD-10-CM | POA: Diagnosis not present

## 2019-11-28 DIAGNOSIS — E1122 Type 2 diabetes mellitus with diabetic chronic kidney disease: Secondary | ICD-10-CM | POA: Diagnosis not present

## 2019-11-28 DIAGNOSIS — R5383 Other fatigue: Secondary | ICD-10-CM | POA: Diagnosis not present

## 2019-12-11 DIAGNOSIS — E1122 Type 2 diabetes mellitus with diabetic chronic kidney disease: Secondary | ICD-10-CM | POA: Diagnosis not present

## 2019-12-11 DIAGNOSIS — N1832 Chronic kidney disease, stage 3b: Secondary | ICD-10-CM | POA: Diagnosis not present

## 2019-12-11 DIAGNOSIS — M1812 Unilateral primary osteoarthritis of first carpometacarpal joint, left hand: Secondary | ICD-10-CM | POA: Diagnosis not present

## 2019-12-11 DIAGNOSIS — I252 Old myocardial infarction: Secondary | ICD-10-CM | POA: Diagnosis not present

## 2019-12-11 DIAGNOSIS — N183 Chronic kidney disease, stage 3 unspecified: Secondary | ICD-10-CM | POA: Diagnosis not present

## 2019-12-11 DIAGNOSIS — I129 Hypertensive chronic kidney disease with stage 1 through stage 4 chronic kidney disease, or unspecified chronic kidney disease: Secondary | ICD-10-CM | POA: Diagnosis not present

## 2019-12-11 DIAGNOSIS — N401 Enlarged prostate with lower urinary tract symptoms: Secondary | ICD-10-CM | POA: Diagnosis not present

## 2019-12-11 DIAGNOSIS — E7800 Pure hypercholesterolemia, unspecified: Secondary | ICD-10-CM | POA: Diagnosis not present

## 2019-12-11 DIAGNOSIS — I2510 Atherosclerotic heart disease of native coronary artery without angina pectoris: Secondary | ICD-10-CM | POA: Diagnosis not present

## 2019-12-19 ENCOUNTER — Encounter: Payer: Self-pay | Admitting: Endocrinology

## 2019-12-19 LAB — MICROALBUMIN / CREATININE URINE RATIO: Microalb Creat Ratio: 113.6

## 2019-12-19 LAB — BUN: BUN: 23

## 2019-12-19 LAB — CREATINE: Creatine, Serum: 1.59

## 2020-01-10 DIAGNOSIS — E1122 Type 2 diabetes mellitus with diabetic chronic kidney disease: Secondary | ICD-10-CM | POA: Diagnosis not present

## 2020-01-10 DIAGNOSIS — E7800 Pure hypercholesterolemia, unspecified: Secondary | ICD-10-CM | POA: Diagnosis not present

## 2020-01-10 DIAGNOSIS — N1832 Chronic kidney disease, stage 3b: Secondary | ICD-10-CM | POA: Diagnosis not present

## 2020-01-10 DIAGNOSIS — I252 Old myocardial infarction: Secondary | ICD-10-CM | POA: Diagnosis not present

## 2020-01-10 DIAGNOSIS — N401 Enlarged prostate with lower urinary tract symptoms: Secondary | ICD-10-CM | POA: Diagnosis not present

## 2020-01-10 DIAGNOSIS — M1812 Unilateral primary osteoarthritis of first carpometacarpal joint, left hand: Secondary | ICD-10-CM | POA: Diagnosis not present

## 2020-01-10 DIAGNOSIS — I129 Hypertensive chronic kidney disease with stage 1 through stage 4 chronic kidney disease, or unspecified chronic kidney disease: Secondary | ICD-10-CM | POA: Diagnosis not present

## 2020-01-10 DIAGNOSIS — N183 Chronic kidney disease, stage 3 unspecified: Secondary | ICD-10-CM | POA: Diagnosis not present

## 2020-01-10 DIAGNOSIS — I2510 Atherosclerotic heart disease of native coronary artery without angina pectoris: Secondary | ICD-10-CM | POA: Diagnosis not present

## 2020-01-13 ENCOUNTER — Ambulatory Visit: Payer: Medicare HMO | Admitting: Endocrinology

## 2020-01-17 DIAGNOSIS — I129 Hypertensive chronic kidney disease with stage 1 through stage 4 chronic kidney disease, or unspecified chronic kidney disease: Secondary | ICD-10-CM | POA: Diagnosis not present

## 2020-01-17 DIAGNOSIS — G40909 Epilepsy, unspecified, not intractable, without status epilepticus: Secondary | ICD-10-CM | POA: Diagnosis not present

## 2020-01-17 DIAGNOSIS — N1832 Chronic kidney disease, stage 3b: Secondary | ICD-10-CM | POA: Diagnosis not present

## 2020-01-17 DIAGNOSIS — Z794 Long term (current) use of insulin: Secondary | ICD-10-CM | POA: Diagnosis not present

## 2020-01-17 DIAGNOSIS — E1122 Type 2 diabetes mellitus with diabetic chronic kidney disease: Secondary | ICD-10-CM | POA: Diagnosis not present

## 2020-01-17 DIAGNOSIS — R400 Somnolence: Secondary | ICD-10-CM | POA: Diagnosis not present

## 2020-01-27 ENCOUNTER — Encounter: Payer: Self-pay | Admitting: Endocrinology

## 2020-01-27 ENCOUNTER — Telehealth: Payer: Self-pay | Admitting: Endocrinology

## 2020-01-27 ENCOUNTER — Ambulatory Visit: Payer: Medicare HMO | Admitting: Endocrinology

## 2020-01-27 ENCOUNTER — Other Ambulatory Visit: Payer: Self-pay

## 2020-01-27 VITALS — BP 160/80 | HR 70 | Ht 64.5 in | Wt 169.6 lb

## 2020-01-27 DIAGNOSIS — E119 Type 2 diabetes mellitus without complications: Secondary | ICD-10-CM

## 2020-01-27 LAB — POCT GLYCOSYLATED HEMOGLOBIN (HGB A1C): Hemoglobin A1C: 7.7 % — AB (ref 4.0–5.6)

## 2020-01-27 MED ORDER — ACCU-CHEK SOFTCLIX LANCETS MISC: 1.0000 | Freq: Two times a day (BID) | 0 refills | Status: AC

## 2020-01-27 MED ORDER — NOVOLIN 70/30 FLEXPEN RELION (70-30) 100 UNIT/ML ~~LOC~~ SUPN: PEN_INJECTOR | SUBCUTANEOUS | 3 refills | Status: DC

## 2020-01-27 MED ORDER — ACCU-CHEK AVIVA PLUS VI STRP: 1.0000 | ORAL_STRIP | Freq: Two times a day (BID) | 0 refills | Status: DC

## 2020-01-27 MED ORDER — ACCU-CHEK AVIVA PLUS W/DEVICE KIT: 1.0000 | PACK | Freq: Two times a day (BID) | 0 refills | Status: DC

## 2020-01-27 NOTE — Telephone Encounter (Signed)
Rx's have been added to pt medication list.

## 2020-01-27 NOTE — Telephone Encounter (Signed)
Patient's wife Amil Amen called to let Dr. Everardo All know that patient uses the Accuchek Aviva Plus Meter. Test strips are Accuchek Aviva Plus Test Strips.Lancets are Accuchek Soft Clix.

## 2020-01-27 NOTE — Patient Instructions (Addendum)
good diet and exercise significantly improve the control of your diabetes.  please let me know if you wish to be referred to a dietician.  high blood sugar is very risky to your health.  you should see an eye doctor and dentist every year.  It is very important to get all recommended vaccinations.  Controlling your blood pressure and cholesterol drastically reduces the damage diabetes does to your body.  Those who smoke should quit.  Please discuss these with your doctor.  check your blood sugar twice a day.  vary the time of day when you check, between before the 3 meals, and at bedtime.  also check if you have symptoms of your blood sugar being too high or too low.  please keep a record of the readings and bring it to your next appointment here (or you can bring the meter itself).  You can write it on any piece of paper.  please call us sooner if your blood sugar goes below 70, or if you have a lot of readings over 200. We will need to take this complex situation in stages.   Please pick up the new medication from Dr Valentina Lucks.  Please let us know what it is.  When you start this, please reduce the evening insulin to 5 units.   Please continue the same other medications. Out goal will be to stop the insulin when we can, if possible.   Please come back for a follow-up appointment in 2-3 weeks.

## 2020-01-27 NOTE — Telephone Encounter (Signed)
After patient left his appointment here, he went over to his PCP office and asked about a once a week insulin pen - patient came back here to let Dr Everardo All know that his PCP is not going to call it in and patient asking if Dr Everardo All could call it in for him. Patient unaware of the name of medication, said Dr Everardo All would know. I asked if it was the Novolin and patient was not sure.  Pharmacy - [id number] - [place] ([place]), [place] - [address] Phone:  [phone]  Fax:  [phone]

## 2020-01-27 NOTE — Progress Notes (Signed)
Subjective:    Patient ID: Scott Wilkins, male    DOB: 1940/10/26, 79 y.o.   MRN: [id number]  HPI pt is referred by Dr Valentina Lucks, for diabetes.  Pt states DM was dx'ed in 2008; it is complicated by PN, CRI, and CAD; he has been on insulin since 2016; pt says his diet and exercise are not good; he has never had pancreatitis, pancreatic surgery, severe hypoglycemia or DKA. He takes 30 units qam and 20-22 units QPM.  He has mild hypoglycemia approx twice per month.  This happens in the middle of the night.  Pt says Dr Valentina Lucks will give him a supply of samples of a once a week injection.   Past Medical History:  Diagnosis Date  . CAD (coronary artery disease)    a. 06/2014 Lateral STEMI/PCI: LM nl, LAD 90d, small, D1 95 (PTCA only), LCX 90p (3.5 x 16 Promus DES), RCA dominant, 52m, RPDA 37m.  . CKD (chronic kidney disease), stage III   . Diabetes mellitus   . Hyperlipidemia   . Hypertension   . Rosacea   . Seizures (HCC)    h/o - none in years    Past Surgical History:  Procedure Laterality Date  . CRANIOTOMY  1948   left craniectomy after head trauma  . LEFT HEART CATHETERIZATION WITH CORONARY ANGIOGRAM N/A 07/04/2014   Procedure: LEFT HEART CATHETERIZATION WITH CORONARY ANGIOGRAM;  Surgeon: Peter M Swaziland, MD;  Location: [facility] CATH LAB;  Service: Cardiovascular;  Laterality: N/A;  . PERCUTANEOUS CORONARY STENT INTERVENTION (PCI-S)  07/04/2014   Procedure: PERCUTANEOUS CORONARY STENT INTERVENTION (PCI-S);  Surgeon: Peter M [place]azil[place], MD;  Location: [facility] CATH LAB;  Service: Cardiovascular;;  prox CFX  . RIB FRACTURE SURGERY  1976   ORIF after frcture due to trauma    Social History   Socioeconomic History  . Marital status: Married    Spouse name: Not on file  . Number of children: 3  . Years of education: 8  . Highest education level: Not on file  Occupational History  . Occupation: truck Hospital doctor    Comment: retired  Tobacco Use  . Smoking status: Never Smoker  . Smokeless tobacco:  Never Used  Vaping Use  . Vaping Use: Never used  Substance and Sexual Activity  . Alcohol use: No  . Drug use: No  . Sexual activity: Never  Other Topics Concern  . Not on file  Social History Narrative   Finished 8th grade. Married '64. 3 dtrs. 5 Grandchildren. Work - retired Naval architect. Lives in [place] with wife.   Social Determinants of Health   Financial Resource Strain:   . Difficulty of Paying Living Expenses:   Food Insecurity:   . Worried About Programme researcher, broadcasting/film/video in the Last Year:   . Barista in the Last Year:   Transportation Needs:   . Freight forwarder (Medical):   Marland Kitchen Lack of Transportation (Non-Medical):   Physical Activity:   . Days of Exercise per Week:   . Minutes of Exercise per Session:   Stress:   . Feeling of Stress :   Social Connections:   . Frequency of Communication with Friends and Family:   . Frequency of Social Gatherings with Friends and Family:   . Attends Religious Services:   . Active Member of Clubs or Organizations:   . Attends Banker Meetings:   Marland Kitchen Marital Status:   Intimate Partner Violence:   . Fear of Current  or Ex-Partner:   . Emotionally Abused:   Marland Kitchen Physically Abused:   . Sexually Abused:     Current Outpatient Medications on File Prior to Visit  Medication Sig Dispense Refill  . aspirin 81 MG chewable tablet Chew 1 tablet (81 mg total) by mouth daily.    Marland Kitchen atorvastatin (LIPITOR) 80 MG tablet Take 1 tablet (80 mg total) by mouth daily at 6 PM. 90 tablet 3  . carvedilol (COREG) 3.125 MG tablet TAKE 1 TABLET TWICE DAILY WITH MEALS 180 tablet 3  . glimepiride (AMARYL) 4 MG tablet TAKE ONE-HALF TABLET BY MOUTH TWICE DAILY (Patient taking differently: Take 2 mg by mouth See admin instructions. Take 1 tablet in the morning and 1/2 tablet in the evening) 90 tablet 0  . nitroGLYCERIN (NITROSTAT) 0.4 MG SL tablet Place 1 tablet (0.4 mg total) under the tongue every 5 (five) minutes x 3 doses as needed for  chest pain. 25 tablet 11  . Simethicone (GAS-X PO) Take 1 tablet by mouth as needed.    . tamsulosin (FLOMAX) 0.4 MG CAPS capsule Take 0.4 mg by mouth daily.      No current facility-administered medications on file prior to visit.    No Known Allergies  Family History  Problem Relation Age of Onset  . Heart disease Father        CAD - died @ 40 of MI  . Heart disease Mother        CAD  . Heart disease Brother        CAD - died @ 77 of MI  . Heart disease Brother        CAD  . Cancer Neg Hx   . Diabetes Neg Hx   . COPD Neg Hx     BP (!) 160/80   Pulse 70   Ht 5' 4.5" (1.638 m)   Wt 169 lb 9.6 oz (76.9 kg)   SpO2 97%   BMI 28.66 kg/m     Review of Systems denies weight loss, blurry vision, sob, n/v, urinary frequency,and depression.  He has chronic memory loss.      Objective:   Physical Exam VITAL SIGNS:  See vs page GENERAL: no distress Pulses: dorsalis pedis intact bilat.   MSK: no deformity of the feet CV: no leg edema Skin:  no ulcer on the feet.  normal color and temp on the feet. Neuro: sensation is intact to touch on the feet   Lab Results  Component Value Date   HGBA1C 7.7 (A) 01/27/2020   Lab Results  Component Value Date   CREATININE 1.57 (H) 08/04/2017   BUN 23 05/16/2019   NA 144 08/04/2017   K 4.4 08/04/2017   CL 104 08/04/2017   CO2 22 08/04/2017   I have reviewed outside records, and summarized: Pt was noted to have elevated A1c, and referred here.  CRI, dyslipidemia, CAD, and BPH were also addressed.        Assessment & Plan:  type 2 DM, with CRI, new to me.  Uncertain prognosis.  He may be manageable off insulin.   Patient Instructions  good diet and exercise significantly improve the control of your diabetes.  please let me know if you wish to be referred to a dietician.  high blood sugar is very risky to your health.  you should see an eye doctor and dentist every year.  It is very important to get all recommended vaccinations.   Controlling your blood pressure and cholesterol  drastically reduces the damage diabetes does to your body.  Those who smoke should quit.  Please discuss these with your doctor.  check your blood sugar twice a day.  vary the time of day when you check, between before the 3 meals, and at bedtime.  also check if you have symptoms of your blood sugar being too high or too low.  please keep a record of the readings and bring it to your next appointment here (or you can bring the meter itself).  You can write it on any piece of paper.  please call us sooner if your blood sugar goes below 70, or if you have a lot of readings over 200. We will need to take this complex situation in stages.   Please pick up the new medication from Dr Valentina Lucks.  Please let us know what it is.  When you start this, please reduce the evening insulin to 5 units.   Please continue the same other medications. Out goal will be to stop the insulin when we can, if possible.   Please come back for a follow-up appointment in 2-3 weeks.

## 2020-01-27 NOTE — Telephone Encounter (Signed)
Unfortunately, we will not be able to process this refill request without the name of the medication being requested. During appt, pt was also advised to provide the name of the meter being used so that refills for strips and lancets could be sent. Will await their call as advised during appt.

## 2020-01-28 NOTE — Telephone Encounter (Signed)
Returned wife's call and informed about Dr. George Hugh response below. Verbalized acceptance and understanding.

## 2020-01-28 NOTE — Telephone Encounter (Signed)
Please refer to wife's concern below

## 2020-01-28 NOTE — Telephone Encounter (Signed)
Thanks for the information. We'll stop the glimeperide later.  Please continue for now.

## 2020-01-28 NOTE — Telephone Encounter (Signed)
Patient's wife states the once weekly shot is Ozempic (asked her what MG and she's not sure) - but Dr Valentina Lucks (PCP) also told patient to not take Glimepiride while on Ozempic. Patient wants to know if this was "ok" - ph# [phone] (wife)

## 2020-01-31 DIAGNOSIS — I2510 Atherosclerotic heart disease of native coronary artery without angina pectoris: Secondary | ICD-10-CM | POA: Diagnosis not present

## 2020-01-31 DIAGNOSIS — I252 Old myocardial infarction: Secondary | ICD-10-CM | POA: Diagnosis not present

## 2020-01-31 DIAGNOSIS — N1832 Chronic kidney disease, stage 3b: Secondary | ICD-10-CM | POA: Diagnosis not present

## 2020-01-31 DIAGNOSIS — I129 Hypertensive chronic kidney disease with stage 1 through stage 4 chronic kidney disease, or unspecified chronic kidney disease: Secondary | ICD-10-CM | POA: Diagnosis not present

## 2020-01-31 DIAGNOSIS — E1122 Type 2 diabetes mellitus with diabetic chronic kidney disease: Secondary | ICD-10-CM | POA: Diagnosis not present

## 2020-01-31 DIAGNOSIS — N401 Enlarged prostate with lower urinary tract symptoms: Secondary | ICD-10-CM | POA: Diagnosis not present

## 2020-01-31 DIAGNOSIS — E7800 Pure hypercholesterolemia, unspecified: Secondary | ICD-10-CM | POA: Diagnosis not present

## 2020-01-31 DIAGNOSIS — M1812 Unilateral primary osteoarthritis of first carpometacarpal joint, left hand: Secondary | ICD-10-CM | POA: Diagnosis not present

## 2020-01-31 DIAGNOSIS — N183 Chronic kidney disease, stage 3 unspecified: Secondary | ICD-10-CM | POA: Diagnosis not present

## 2020-02-03 ENCOUNTER — Other Ambulatory Visit: Payer: Self-pay

## 2020-02-03 DIAGNOSIS — E119 Type 2 diabetes mellitus without complications: Secondary | ICD-10-CM

## 2020-02-03 MED ORDER — GLIMEPIRIDE 2 MG PO TABS: 2.0000 mg | ORAL_TABLET | Freq: Two times a day (BID) | ORAL | 2 refills | Status: DC

## 2020-02-10 ENCOUNTER — Other Ambulatory Visit: Payer: Self-pay

## 2020-02-10 MED ORDER — TAMSULOSIN HCL 0.4 MG PO CAPS: 0.4000 mg | ORAL_CAPSULE | Freq: Every day | ORAL | 3 refills | Status: DC

## 2020-02-18 ENCOUNTER — Other Ambulatory Visit: Payer: Self-pay | Admitting: Cardiology

## 2020-02-18 ENCOUNTER — Telehealth: Payer: Self-pay | Admitting: Internal Medicine

## 2020-02-18 ENCOUNTER — Other Ambulatory Visit: Payer: Self-pay | Admitting: Endocrinology

## 2020-02-18 DIAGNOSIS — E119 Type 2 diabetes mellitus without complications: Secondary | ICD-10-CM

## 2020-02-18 NOTE — Telephone Encounter (Signed)
Noted  

## 2020-02-18 NOTE — Telephone Encounter (Signed)
Pt's wife said [facility] told her she needs to let us know that they will need new prescription sent to them for pt's test strips and supplies. She said they will fax over the request.

## 2020-02-24 ENCOUNTER — Other Ambulatory Visit: Payer: Self-pay

## 2020-02-24 ENCOUNTER — Ambulatory Visit: Payer: Medicare HMO | Admitting: Endocrinology

## 2020-02-24 ENCOUNTER — Encounter: Payer: Self-pay | Admitting: Endocrinology

## 2020-02-24 VITALS — BP 140/80 | HR 86 | Wt 168.0 lb

## 2020-02-24 DIAGNOSIS — E119 Type 2 diabetes mellitus without complications: Secondary | ICD-10-CM

## 2020-02-24 LAB — POCT GLYCOSYLATED HEMOGLOBIN (HGB A1C): Hemoglobin A1C: 7.3 % — AB (ref 4.0–5.6)

## 2020-02-24 MED ORDER — OZEMPIC (0.25 OR 0.5 MG/DOSE) 2 MG/1.5ML ~~LOC~~ SOPN: 0.5000 mg | PEN_INJECTOR | SUBCUTANEOUS | 11 refills | Status: DC

## 2020-02-24 MED ORDER — NOVOLIN 70/30 FLEXPEN RELION (70-30) 100 UNIT/ML ~~LOC~~ SUPN: 10.0000 [IU] | PEN_INJECTOR | Freq: Every day | SUBCUTANEOUS | 3 refills | Status: DC

## 2020-02-24 NOTE — Patient Instructions (Addendum)
check your blood sugar twice a day.  vary the time of day when you check, between before the 3 meals, and at bedtime.  also check if you have symptoms of your blood sugar being too high or too low.  please keep a record of the readings and bring it to your next appointment here (or you can bring the meter itself).  You can write it on any piece of paper.  please call us sooner if your blood sugar goes below 70, or if you have a lot of readings over 200. We will need to take this complex situation in stages.   please reduce the insulin to 10 units with breakfast, and none in the evening, and: Increase the Ozempic to 0.5 mg weekly, and: Please continue the same glimepiride.   Out goal will be to stop the insulin when we can, if possible.   Please come back for a follow-up appointment in 1 month.

## 2020-02-24 NOTE — Progress Notes (Signed)
Subjective:    Patient ID: Scott Wilkins, male    DOB: 01-09-1941, 79 y.o.   MRN: [id number]  HPI Pt returns for f/u of diabetes mellitus: DM type: Insulin-requiring type 2 Dx'ed: 7253 Complications: PN, CRI, and CAD Therapy: insulin since 2016 DKA: never Severe hypoglycemia: never Pancreatitis: never Pancreatic imaging: normal on 2001 CT SDOH: none Other: goal is to d/c insulin Interval history: He takes 30 units qam and 5 units qpm.  He also takes Ozempic 0.25 mg q week, and glimepiride.  no cbg record, but states cbg's vary from 56-230.   Past Medical History:  Diagnosis Date  . CAD (coronary artery disease)    a. 06/2014 Lateral STEMI/PCI: LM nl, LAD 90d, small, D1 95 (PTCA only), LCX 90p (3.5 x 16 Promus DES), RCA dominant, 2m RPDA 573m . CKD (chronic kidney disease), stage III   . Diabetes mellitus   . Hyperlipidemia   . Hypertension   . Rosacea   . [place] [place]   h/o - none in years    Past Surgical History:  Procedure Laterality Date  . CRANIOTOMY  1948   left craniectomy after head trauma  . LEFT HEART CATHETERIZATION WITH CORONARY ANGIOGRAM N/A 07/04/2014   Procedure: LEFT HEART CATHETERIZATION WITH CORONARY ANGIOGRAM;  Surgeon: Peter M JoMartiniqueMD;  [facility];  Service: Cardiovascular;  Laterality: N/A;  . PERCUTANEOUS CORONARY STENT INTERVENTION (PCI-S)  07/04/2014   Procedure: PERCUTANEOUS CORONARY STENT INTERVENTION (PCI-S);  Surgeon: Peter M JoMartiniqueMD;  [facility];  Service: Cardiovascular;;  prox CFX  . RIB FRACTURE SURGERY  1976   ORIF after frcture due to trauma    Social History   Socioeconomic History  . Marital status: Married    Spouse name: Not on file  . Number of children: 3  . Years of education: 8  . Highest education level: Not on file  Occupational History  . Occupation: truck drGeophysicist/field seismologist  Comment: retired  Tobacco Use  . Smoking status: Never Smoker  . Smokeless tobacco: Never Used  Vaping Use  . Vaping  Use: Never used  Substance and Sexual Activity  . Alcohol use: No  . Drug use: No  . Sexual activity: Never  Other Topics Concern  . Not on file  Social History Narrative   Finished 8th grade. Married '64. 3 dtrs. 5 Grandchildren. 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   Social Determinants of Health   Financial Resource Strain:   . Difficulty of Paying Living Expenses: Not on file  Food Insecurity:   . Worried About RuCharity fundraisern the Last Year: Not on file  . Ran Out of Food in the Last Year: Not on file  Transportation Needs:   . Lack of Transportation (Medical): Not on file  . Lack of Transportation (Non-Medical): Not on file  Physical Activity:   . Days of Exercise per Week: Not on file  . Minutes of Exercise per Session: Not on file  Stress:   . Feeling of Stress : Not on file  Social Connections:   . Frequency of Communication with Friends and Family: Not on file  . Frequency of Social Gatherings with Friends and Family: Not on file  . Attends Religious Services: Not on file  . Active Member of Clubs or Organizations: Not on file  . Attends ClArchivisteetings: Not on file  . Marital Status: Not on file  Intimate  Partner Violence:   . Fear of Current or Ex-Partner: Not on file  . Emotionally Abused: Not on file  . Physically Abused: Not on file  . Sexually Abused: Not on file    Current Outpatient Medications on File Prior to Visit  Medication Sig Dispense Refill  . Accu-Chek Softclix Lancets lancets 1 each by Other route 2 (two) times daily. E11.9 200 each 0  . aspirin 81 MG chewable tablet Chew 1 tablet (81 mg total) by mouth daily.    Marland Kitchen atorvastatin (LIPITOR) 80 MG tablet Take 1 tablet (80 mg total) by mouth daily at 6 PM. 90 tablet 3  . Blood Glucose Monitoring Suppl (ACCU-CHEK AVIVA PLUS) w/Device KIT 1 each by Does not apply route 2 (two) times daily. E11.9 1 kit 0  . carvedilol (COREG) 3.125 MG tablet TAKE 1 TABLET  TWICE DAILY WITH MEALS 180 tablet 1  . glimepiride (AMARYL) 2 MG tablet TAKE 1 TABLET TWICE DAILY 180 tablet 3  . glucose blood (ACCU-CHEK AVIVA PLUS) test strip 1 each by Other route 2 (two) times daily. E11.9 200 each 0  . nitroGLYCERIN (NITROSTAT) 0.4 MG SL tablet Place 1 tablet (0.4 mg total) under the tongue every 5 (five) minutes x 3 doses as needed for chest pain. 25 tablet 11  . Simethicone (GAS-X PO) Take 1 tablet by mouth as needed.    . tamsulosin (FLOMAX) 0.4 MG CAPS capsule Take 1 capsule (0.4 mg total) by mouth daily. 30 capsule 3   No current facility-administered medications on file prior to visit.    No Known Allergies  Family History  Problem Relation Age of Onset  . Heart disease Father        CAD - died @ 37 of MI  . Heart disease Mother        CAD  . Heart disease Brother        CAD - died @ 5 of MI  . Heart disease Brother        CAD  . Cancer Neg Hx   . Diabetes Neg Hx   . COPD Neg Hx     BP 140/80   Pulse 86   Wt 168 lb (76.2 kg)   SpO2 93%   BMI 28.39 kg/m    Review of Systems Denies LOC    Objective:   Physical Exam VITAL SIGNS:  See vs page GENERAL: no distress Pulses: dorsalis pedis intact bilat.   MSK: no deformity of the feet CV: no leg edema Skin:  no ulcer on the feet.  normal color and temp on the feet. Neuro: sensation is intact to touch on the feet  Lab Results  Component Value Date   CREATININE 1.57 (H) 08/04/2017   BUN 23 05/16/2019   NA 144 08/04/2017   K 4.4 08/04/2017   CL 104 08/04/2017   CO2 22 08/04/2017     A1c=7.3%    Assessment & Plan:  Insulin-requiring type 2 DM: goal is to d/c insulin Hypoglycemia, due to insulin/SU: this limits aggressiveness of glycemic control.   Patient Instructions  check your blood sugar twice a day.  vary the time of day when you check, between before the 3 meals, and at bedtime.  also check if you have symptoms of your blood sugar being too high or too low.  please keep a  record of the readings and bring it to your next appointment here (or you can bring the meter itself).  You can write it on  any piece of paper.  please call us sooner if your blood sugar goes below 70, or if you have a lot of readings over 200. We will need to take this complex situation in stages.   please reduce the insulin to 10 units with breakfast, and none in the evening, and: Increase the Ozempic to 0.5 mg weekly, and: Please continue the same glimepiride.   Out goal will be to stop the insulin when we can, if possible.   Please come back for a follow-up appointment in 1 month.

## 2020-02-26 ENCOUNTER — Other Ambulatory Visit: Payer: Self-pay

## 2020-02-26 MED ORDER — ACCU-CHEK AVIVA VI SOLN: 1.0000 | 0 refills | Status: AC | PRN

## 2020-03-11 DIAGNOSIS — E1122 Type 2 diabetes mellitus with diabetic chronic kidney disease: Secondary | ICD-10-CM | POA: Diagnosis not present

## 2020-03-11 DIAGNOSIS — N183 Chronic kidney disease, stage 3 unspecified: Secondary | ICD-10-CM | POA: Diagnosis not present

## 2020-03-11 DIAGNOSIS — M1812 Unilateral primary osteoarthritis of first carpometacarpal joint, left hand: Secondary | ICD-10-CM | POA: Diagnosis not present

## 2020-03-11 DIAGNOSIS — N1832 Chronic kidney disease, stage 3b: Secondary | ICD-10-CM | POA: Diagnosis not present

## 2020-03-11 DIAGNOSIS — E7800 Pure hypercholesterolemia, unspecified: Secondary | ICD-10-CM | POA: Diagnosis not present

## 2020-03-11 DIAGNOSIS — I252 Old myocardial infarction: Secondary | ICD-10-CM | POA: Diagnosis not present

## 2020-03-11 DIAGNOSIS — I2510 Atherosclerotic heart disease of native coronary artery without angina pectoris: Secondary | ICD-10-CM | POA: Diagnosis not present

## 2020-03-11 DIAGNOSIS — N401 Enlarged prostate with lower urinary tract symptoms: Secondary | ICD-10-CM | POA: Diagnosis not present

## 2020-03-11 DIAGNOSIS — I129 Hypertensive chronic kidney disease with stage 1 through stage 4 chronic kidney disease, or unspecified chronic kidney disease: Secondary | ICD-10-CM | POA: Diagnosis not present

## 2020-04-08 ENCOUNTER — Ambulatory Visit: Payer: Medicare HMO | Admitting: Endocrinology

## 2020-04-08 ENCOUNTER — Other Ambulatory Visit: Payer: Self-pay

## 2020-04-08 ENCOUNTER — Encounter: Payer: Self-pay | Admitting: Endocrinology

## 2020-04-08 VITALS — BP 130/88 | HR 83 | Ht 64.0 in | Wt 163.2 lb

## 2020-04-08 DIAGNOSIS — E119 Type 2 diabetes mellitus without complications: Secondary | ICD-10-CM

## 2020-04-08 LAB — POCT GLYCOSYLATED HEMOGLOBIN (HGB A1C): Hemoglobin A1C: 8 % — AB (ref 4.0–5.6)

## 2020-04-08 MED ORDER — OZEMPIC (1 MG/DOSE) 2 MG/1.5ML ~~LOC~~ SOPN: 1.0000 mg | PEN_INJECTOR | SUBCUTANEOUS | 3 refills | Status: DC

## 2020-04-08 NOTE — Patient Instructions (Addendum)
check your blood sugar twice a day.  vary the time of day when you check, between before the 3 meals, and at bedtime.  also check if you have symptoms of your blood sugar being too high or too low.  please keep a record of the readings and bring it to your next appointment here (or you can bring the meter itself).  You can write it on any piece of paper.  please call us sooner if your blood sugar goes below 70, or if you have a lot of readings over 200. We will need to take this complex situation in stages.   Pleases stop taking the the insulin, and: Increase the Ozempic to 1 mg weekly, and:  Please continue the same glimepiride.   Please come back for a follow-up appointment in 6 weeks.
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
## 2020-04-22 ENCOUNTER — Emergency Department (HOSPITAL_COMMUNITY)
Admission: EM | Admit: 2020-04-22 | Discharge: 2020-04-22 | Disposition: A | Discharge status: Home / Self Care | Payer: Medicare HMO | Attending: Emergency Medicine | Admitting: Emergency Medicine

## 2020-04-22 ENCOUNTER — Encounter (HOSPITAL_COMMUNITY): Payer: Self-pay

## 2020-04-22 ENCOUNTER — Emergency Department (HOSPITAL_COMMUNITY): Payer: Medicare HMO

## 2020-04-22 ENCOUNTER — Telehealth: Payer: Self-pay | Admitting: Endocrinology

## 2020-04-22 DIAGNOSIS — Z79899 Other long term (current) drug therapy: Secondary | ICD-10-CM | POA: Insufficient documentation

## 2020-04-22 DIAGNOSIS — I2510 Atherosclerotic heart disease of native coronary artery without angina pectoris: Secondary | ICD-10-CM | POA: Insufficient documentation

## 2020-04-22 DIAGNOSIS — R1013 Epigastric pain: Secondary | ICD-10-CM | POA: Diagnosis not present

## 2020-04-22 DIAGNOSIS — E1122 Type 2 diabetes mellitus with diabetic chronic kidney disease: Secondary | ICD-10-CM | POA: Diagnosis not present

## 2020-04-22 DIAGNOSIS — Z7982 Long term (current) use of aspirin: Secondary | ICD-10-CM | POA: Diagnosis not present

## 2020-04-22 DIAGNOSIS — H532 Diplopia: Secondary | ICD-10-CM | POA: Diagnosis not present

## 2020-04-22 DIAGNOSIS — N183 Chronic kidney disease, stage 3 unspecified: Secondary | ICD-10-CM | POA: Diagnosis not present

## 2020-04-22 DIAGNOSIS — R519 Headache, unspecified: Secondary | ICD-10-CM

## 2020-04-22 DIAGNOSIS — H538 Other visual disturbances: Secondary | ICD-10-CM | POA: Diagnosis not present

## 2020-04-22 DIAGNOSIS — Z794 Long term (current) use of insulin: Secondary | ICD-10-CM | POA: Insufficient documentation

## 2020-04-22 DIAGNOSIS — I129 Hypertensive chronic kidney disease with stage 1 through stage 4 chronic kidney disease, or unspecified chronic kidney disease: Secondary | ICD-10-CM | POA: Insufficient documentation

## 2020-04-22 DIAGNOSIS — Z7984 Long term (current) use of oral hypoglycemic drugs: Secondary | ICD-10-CM | POA: Insufficient documentation

## 2020-04-22 DIAGNOSIS — G9389 Other specified disorders of brain: Secondary | ICD-10-CM | POA: Diagnosis not present

## 2020-04-22 DIAGNOSIS — R1310 Dysphagia, unspecified: Secondary | ICD-10-CM | POA: Diagnosis not present

## 2020-04-22 LAB — CBC WITH DIFFERENTIAL/PLATELET
Abs Immature Granulocytes: 0.01 10*3/uL (ref 0.00–0.07)
Basophils Absolute: 0 10*3/uL (ref 0.0–0.1)
Basophils Relative: 1 %
Eosinophils Absolute: 0.1 10*3/uL (ref 0.0–0.5)
Eosinophils Relative: 3 %
HCT: 42.2 % (ref 39.0–52.0)
Hemoglobin: 14.3 g/dL (ref 13.0–17.0)
Immature Granulocytes: 0 %
Lymphocytes Relative: 34 %
Lymphs Abs: 1.6 10*3/uL (ref 0.7–4.0)
MCH: 31.5 pg (ref 26.0–34.0)
MCHC: 33.9 g/dL (ref 30.0–36.0)
MCV: 93 fL (ref 80.0–100.0)
Monocytes Absolute: 0.4 10*3/uL (ref 0.1–1.0)
Monocytes Relative: 8 %
Neutro Abs: 2.4 10*3/uL (ref 1.7–7.7)
Neutrophils Relative %: 54 %
Platelets: 114 10*3/uL — ABNORMAL LOW (ref 150–400)
RBC: 4.54 MIL/uL (ref 4.22–5.81)
RDW: 12.5 % (ref 11.5–15.5)
WBC: 4.5 10*3/uL (ref 4.0–10.5)
nRBC: 0 % (ref 0.0–0.2)

## 2020-04-22 LAB — CBG MONITORING, ED: Glucose-Capillary: 140 mg/dL — ABNORMAL HIGH (ref 70–99)

## 2020-04-22 LAB — COMPREHENSIVE METABOLIC PANEL
ALT: 27 U/L (ref 0–44)
AST: 25 U/L (ref 15–41)
Albumin: 4.2 g/dL (ref 3.5–5.0)
Alkaline Phosphatase: 108 U/L (ref 38–126)
Anion gap: 9 (ref 5–15)
BUN: 26 mg/dL — ABNORMAL HIGH (ref 8–23)
CO2: 25 mmol/L (ref 22–32)
Calcium: 8.9 mg/dL (ref 8.9–10.3)
Chloride: 105 mmol/L (ref 98–111)
Creatinine, Ser: 1.41 mg/dL — ABNORMAL HIGH (ref 0.61–1.24)
GFR, Estimated: 51 mL/min — ABNORMAL LOW (ref >60)
Glucose, Bld: 135 mg/dL — ABNORMAL HIGH (ref 70–99)
Potassium: 3.7 mmol/L (ref 3.5–5.1)
Sodium: 139 mmol/L (ref 135–145)
Total Bilirubin: 0.8 mg/dL (ref 0.3–1.2)
Total Protein: 6.9 g/dL (ref 6.5–8.1)

## 2020-04-22 MED ORDER — METOCLOPRAMIDE HCL 5 MG/ML IJ SOLN
10.0000 mg | Freq: Once | INTRAMUSCULAR | Status: AC
  Administered 2020-04-22: 10 mg via INTRAVENOUS
  Filled 2020-04-22: qty 2

## 2020-04-22 MED ORDER — GADOBUTROL 1 MMOL/ML IV SOLN
7.0000 mL | Freq: Once | INTRAVENOUS | Status: AC | PRN
  Administered 2020-04-22: 7 mL via INTRAVENOUS

## 2020-04-22 MED ORDER — ACETAMINOPHEN 325 MG PO TABS
650.0000 mg | ORAL_TABLET | Freq: Once | ORAL | Status: AC
  Administered 2020-04-22: 650 mg via ORAL
  Filled 2020-04-22: qty 2

## 2020-04-22 MED ORDER — SODIUM CHLORIDE 0.9 % IV BOLUS
500.0000 mL | Freq: Once | INTRAVENOUS | Status: AC
  Administered 2020-04-22: 500 mL via INTRAVENOUS

## 2020-04-22 NOTE — Telephone Encounter (Signed)
Notified pt--D/C ozempic and go to ER by Dr. Everardo All. Pt stated did not have transportation to get there and wife is out shopping. Called wife and agreed to take the pt to ER.

## 2020-04-22 NOTE — ED Triage Notes (Signed)
Pt presents with c/o blurred vision and headache for approx 2 weeks, sent here by his MD.

## 2020-04-22 NOTE — Telephone Encounter (Signed)
Patient requests to be called at ph# [phone] re: Patient thinks he is having a reaction to Ozempic. Patient states he has blurred vision and very bad  Headache.

## 2020-04-22 NOTE — Telephone Encounter (Signed)
1.  D/c Ozempic 2.  Go to ER now
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
## 2020-04-22 NOTE — ED Notes (Signed)
Patient transported to MRI 

## 2020-04-22 NOTE — Telephone Encounter (Signed)
Pt stated Rx Ozempic -- taking 10 units  once a week---having  headaches, blurred vision real bad.

## 2020-04-23 ENCOUNTER — Encounter: Payer: Self-pay | Admitting: Neurology

## 2020-04-23 DIAGNOSIS — Z0189 Encounter for other specified special examinations: Secondary | ICD-10-CM | POA: Diagnosis not present

## 2020-04-23 DIAGNOSIS — H532 Diplopia: Secondary | ICD-10-CM | POA: Diagnosis not present

## 2020-04-23 DIAGNOSIS — R519 Headache, unspecified: Secondary | ICD-10-CM | POA: Diagnosis not present

## 2020-04-24 ENCOUNTER — Telehealth: Payer: Self-pay | Admitting: Endocrinology

## 2020-04-24 DIAGNOSIS — R519 Headache, unspecified: Secondary | ICD-10-CM | POA: Diagnosis not present

## 2020-04-24 DIAGNOSIS — H532 Diplopia: Secondary | ICD-10-CM | POA: Diagnosis not present

## 2020-04-24 LAB — HM DIABETES EYE EXAM

## 2020-04-24 NOTE — Telephone Encounter (Signed)
Notified pt have an appt 04-27-20 @1 :30 pm with dr. .

## 2020-04-24 NOTE — Telephone Encounter (Signed)
please contact patient: Please move up next appt to next week

## 2020-04-27 ENCOUNTER — Other Ambulatory Visit: Payer: Self-pay

## 2020-04-27 ENCOUNTER — Encounter: Payer: Self-pay | Admitting: Endocrinology

## 2020-04-27 ENCOUNTER — Telehealth: Payer: Self-pay | Admitting: Cardiology

## 2020-04-27 ENCOUNTER — Ambulatory Visit: Payer: Medicare HMO | Admitting: Endocrinology

## 2020-04-27 VITALS — BP 118/72 | HR 86 | Ht 64.0 in | Wt 161.0 lb

## 2020-04-27 DIAGNOSIS — E119 Type 2 diabetes mellitus without complications: Secondary | ICD-10-CM | POA: Diagnosis not present

## 2020-04-27 MED ORDER — INSULIN NPH ISOPHANE & REGULAR (70-30) 100 UNIT/ML ~~LOC~~ SUSP: 10.0000 [IU] | Freq: Every day | SUBCUTANEOUS | 11 refills | Status: DC

## 2020-04-27 MED ORDER — LISINOPRIL 2.5 MG PO TABS: 2.5000 mg | ORAL_TABLET | Freq: Every day | ORAL | 3 refills | Status: DC

## 2020-04-27 NOTE — Progress Notes (Signed)
Subjective:    Patient ID: Scott Wilkins, male    DOB: [birth year], 79 y.o.   MRN: [id number]  HPI Pt returns for f/u of diabetes mellitus: DM type: Insulin-requiring type 2 Dx'ed: 8811 Complications: PN, CRI, CN4 MN, and CAD.   Therapy: insulin since 2016, Ozempic, and glimepiride.   DKA: never Severe hypoglycemia: never Pancreatitis: never Pancreatic imaging: normal on 2001 CT SDOH: none Other: he declines multiple daily injections.  Interval history: Ozempic was d/c'ed, due to headache.  He developed CN 4 palsy.  Dr Ellie Lunch called to advise of this.  He has resumed 70/30, 10 units with breakfast.  cbg varies from 185-225.  Past Medical History:  Diagnosis Date  . CAD (coronary artery disease)    a. 06/2014 Lateral STEMI/PCI: LM nl, LAD 90d, small, D1 95 (PTCA only), LCX 90p (3.5 x 16 Promus DES), RCA dominant, 40m RPDA 56m . CKD (chronic kidney disease), stage III ([facility]  . Diabetes mellitus   . Hyperlipidemia   . Hypertension   . Rosacea   . Seizures ([facility]   h/o - none in years    Past Surgical History:  Procedure Laterality Date  . CRANIOTOMY  1948   left craniectomy after head trauma  . LEFT HEART CATHETERIZATION WITH CORONARY ANGIOGRAM N/A 07/04/2014   Procedure: LEFT HEART CATHETERIZATION WITH CORONARY ANGIOGRAM;  Surgeon: Peter M JoMartiniqueMD;  [facility];  Service: Cardiovascular;  Laterality: N/A;  . PERCUTANEOUS CORONARY STENT INTERVENTION (PCI-S)  07/04/2014   Procedure: PERCUTANEOUS CORONARY STENT INTERVENTION (PCI-S);  Surgeon: Peter M JoMartiniqueMD;  [facility];  Service: Cardiovascular;;  prox CFX  . RIB FRACTURE SURGERY  1976   ORIF after frcture due to trauma    Social History   Socioeconomic History  . Marital status: Married    Spouse name: Not on file  . Number of children: 3  . Years of education: 8  . Highest education level: Not on file  Occupational History  . Occupation: truck drGeophysicist/field seismologist  Comment: retired  Tobacco Use   . Smoking status: Never Smoker  . Smokeless tobacco: Never Used  Vaping Use  . Vaping Use: Never used  Substance and Sexual Activity  . Alcohol use: No  . Drug use: No  . Sexual activity: Never  Other Topics Concern  . Not on file  Social History Narrative   Finished 8th grade. Married '64. 3 dtrs. 5 Grandchildren. 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   Social Determinants of Health   Financial Resource Strain:   . Difficulty of Paying Living Expenses: Not on file  Food Insecurity:   . Worried About RuCharity fundraisern the Last Year: Not on file  . Ran Out of Food in the Last Year: Not on file  Transportation Needs:   . Lack of Transportation (Medical): Not on file  . Lack of Transportation (Non-Medical): Not on file  Physical Activity:   . Days of Exercise per Week: Not on file  . Minutes of Exercise per Session: Not on file  Stress:   . Feeling of Stress : Not on file  Social Connections:   . Frequency of Communication with Friends and Family: Not on file  . Frequency of Social Gatherings with Friends and Family: Not on file  . Attends Religious Services: Not on file  . Active Member of Clubs or Organizations: Not on file  . Attends Club or  Organization Meetings: Not on file  . Marital Status: Not on file  Intimate Partner Violence:   . Fear of Current or Ex-Partner: Not on file  . Emotionally Abused: Not on file  . Physically Abused: Not on file  . Sexually Abused: Not on file    Current Outpatient Medications on File Prior to Visit  Medication Sig Dispense Refill  . Accu-Chek Softclix Lancets lancets 1 each by Other route 2 (two) times daily. E11.9 (Patient not taking: Reported on 04/27/2020) 200 each 0  . aspirin 81 MG chewable tablet Chew 1 tablet (81 mg total) by mouth daily. (Patient not taking: Reported on 04/27/2020)    . atorvastatin (LIPITOR) 80 MG tablet Take 1 tablet (80 mg total) by mouth daily at 6 PM. (Patient not taking:  Reported on 04/27/2020) 90 tablet 3  . Blood Glucose Calibration (ACCU-CHEK AVIVA) SOLN 1 each by Other route as needed (Use to calibrate glucometer). E11.9 (Patient not taking: Reported on 04/27/2020) 1 each 0  . Blood Glucose Monitoring Suppl (ACCU-CHEK AVIVA PLUS) w/Device KIT 1 each by Does not apply route 2 (two) times daily. E11.9 (Patient not taking: Reported on 04/27/2020) 1 kit 0  . carvedilol (COREG) 3.125 MG tablet TAKE 1 TABLET TWICE DAILY WITH MEALS (Patient not taking: Reported on 04/27/2020) 180 tablet 1  . glimepiride (AMARYL) 2 MG tablet TAKE 1 TABLET TWICE DAILY (Patient not taking: Reported on 04/27/2020) 180 tablet 3  . glucose blood (ACCU-CHEK AVIVA PLUS) test strip 1 each by Other route 2 (two) times daily. E11.9 (Patient not taking: Reported on 04/27/2020) 200 each 0  . nitroGLYCERIN (NITROSTAT) 0.4 MG SL tablet Place 1 tablet (0.4 mg total) under the tongue every 5 (five) minutes x 3 doses as needed for chest pain. (Patient not taking: Reported on 04/27/2020) 25 tablet 11  . Simethicone (GAS-X PO) Take 1 tablet by mouth as needed. (Patient not taking: Reported on 04/27/2020)    . tamsulosin (FLOMAX) 0.4 MG CAPS capsule Take 1 capsule (0.4 mg total) by mouth daily. (Patient not taking: Reported on 04/27/2020) 30 capsule 3   No current facility-administered medications on file prior to visit.    No Known Allergies  Family History  Problem Relation Age of Onset  . Heart disease Father        CAD - died @ 63 of MI  . Heart disease Mother        CAD  . Heart disease Brother        CAD - died @ 41 of MI  . Heart disease Brother        CAD  . Cancer Neg Hx   . Diabetes Neg Hx   . COPD Neg Hx     BP 118/72   Pulse 86   Ht 5' 4"  (1.626 m)   Wt 161 lb (73 kg)   SpO2 97%   BMI 27.64 kg/m    Review of Systems He denies hypoglycemia    Objective:   Physical Exam VITAL SIGNS:  See vs page GENERAL: no distress Pulses: dorsalis pedis intact bilat.   MSK: no  deformity of the feet CV: no leg edema Skin:  no ulcer on the feet.  normal color and temp on the feet. Neuro: sensation is intact to touch on the feet, but decreased from normal.     Lab Results  Component Value Date   HGBA1C 8.0 (A) 04/08/2020   Lab Results  Component Value Date   CREATININE 1.41 (H)  04/22/2020   BUN 26 (H) 04/22/2020   NA 139 04/22/2020   K 3.7 04/22/2020   CL 105 04/22/2020   CO2 25 04/22/2020       Assessment & Plan:  Insulin-requiring type 2 DM, with CRI: uncontrolled.   Headache, due to Ozempic.   Patient Instructions  check your blood sugar twice a day.  vary the time of day when you check, between before the 3 meals, and at bedtime.  also check if you have symptoms of your blood sugar being too high or too low.  please keep a record of the readings and bring it to your next appointment here (or you can bring the meter itself).  You can write it on any piece of paper.  please call us sooner if your blood sugar goes below 70, or if you have a lot of readings over 200. We will need to give up on our plan to stop the insulin. Pleases stop stay off the Ozempic, and: Increase the insulin to 20 units each morning, and: Please continue the same glimepiride (we'll phase this out later) Please come back for a follow-up appointment in 2 months.

## 2020-04-27 NOTE — Telephone Encounter (Signed)
Pt c/o BP issue: STAT if pt c/o blurred vision, one-sided weakness or slurred speech  1. What are your last 5 BP readings?   04/26/20: 148/99 04/27/20: 112/80  2. Are you having any other symptoms (ex. Dizziness, headache, blurred vision, passed out)? Dizziness, double vision, headaches, elevated blood sugar  3. What is your BP issue? Pt has had some issues with his BP going back up since he has been working with the Diabetes Doctor trying to get the patient off of insulin.  Pt has seen the eye doctor and has an appointment with his Endocrinologist   Pt started back on his lisinopril last week. The wife said before he started back on the lisinopril his BP went as high as 170/100.  The wife is concerned that he will run out of his lisinopril if he is to keep taking the medication. Dr. [place]and stopped the medication a while back but he is out of refills. Please send refills to [facility]   *STAT* If patient is at the pharmacy, call can be transferred to refill team.   1. Which medications need to be refilled? (please list name of each medication and dose if known)  lisinopril (PRINIVIL,ZESTRIL) 2.5 MG tablet    2. Which pharmacy/location (including street and city if local pharmacy) is medication to be sent to? [facility] - [place], [place] - [address]  3. Do they need a 30 day or 90 day supply? 90 with refills

## 2020-04-27 NOTE — Telephone Encounter (Signed)
OK to fill Rx for lisinopril 2.5 mg daily  Kayla Weekes [place] MD, [facility]

## 2020-04-27 NOTE — Telephone Encounter (Signed)
Refill sent to [facility]

## 2020-04-27 NOTE — Patient Instructions (Addendum)
check your blood sugar twice a day.  vary the time of day when you check, between before the 3 meals, and at bedtime.  also check if you have symptoms of your blood sugar being too high or too low.  please keep a record of the readings and bring it to your next appointment here (or you can bring the meter itself).  You can write it on any piece of paper.  please call us sooner if your blood sugar goes below 70, or if you have a lot of readings over 200. We will need to give up on our plan to stop the insulin. Pleases stop stay off the Ozempic, and: Increase the insulin to 20 units each morning, and: Please continue the same glimepiride (we'll phase this out later) Please come back for a follow-up appointment in 2 months.

## 2020-04-28 MED ORDER — LISINOPRIL 2.5 MG PO TABS
2.5000 mg | ORAL_TABLET | Freq: Every day | ORAL | 3 refills | Status: DC
Start: 2020-04-28 — End: 2020-11-04

## 2020-04-28 NOTE — Telephone Encounter (Signed)
Spoke to patient's wife Dr.Jordan advised ok to refill Lisinopril 2.5 mg daily.Wife stated patient restarted taking appox 1 week ago.Stated B/P is improving.Advised to continue to monitor B/P and call back if still elevated.

## 2020-05-04 ENCOUNTER — Telehealth: Payer: Self-pay | Admitting: Cardiology

## 2020-05-04 NOTE — Telephone Encounter (Signed)
Pt c/o BP issue: STAT if pt c/o blurred vision, one-sided weakness or slurred speech  1. What are your last 5 BP readings? Morning readings from everyday of the week 148/99 112/80 111/76 110/77 115/78 111/77 110/75 148/99 (yesterday)  2. Are you having any other symptoms (ex. Dizziness, headache, blurred vision, passed out)?  Dizzy, continuous headaches over the past couple of weeks, blurred/double vision - talks slow and sometimes doesn't make sense when he talks   3. What is your BP issue? BP hasn't been regulated/normal over the past few weeks

## 2020-05-04 NOTE — Telephone Encounter (Signed)
Spoke with pt wife, she is concerned because the patient is having severe headaches for several weeks now. This all started when he was started on a new diabetic medication that has sense been stopped and he has not had any for 2 weeks. She reports his blood sugars are running about 200 all the times. He was restarted on insulin per his pcp. He was seen in the ER and had an MRI that looked okay except for the evidence of the head trama he had as a child. She reports he is dizzy all the time and staggers when he walks. His mouth is even when he smiles. He is not drinking a lot of fluid and encouraged patient to drink plenty of juice, water or pedalite and avoid caffeine. Explained his bp is fine and do not think all of his symptoms are related to his heart. She is asking for a sooner appointment with dr Swaziland. Explained there are no openings but she asked this information be relayed to dr Sw[place]iland for his review.

## 2020-05-04 NOTE — Telephone Encounter (Signed)
His BP looks fine. I don't think his HAs are related to cardiac meds. He should check with his primary care first. Maybe he could see an APP. I know by availability is very limited.  Scott Wilkins

## 2020-05-05 NOTE — Telephone Encounter (Signed)
Returned call to patient's wife Dr.Jordan's recommendation given.Wife stated she will call PCP Dr.Griffin this morning for appointment.Advised to call back for sooner appointment if needed.

## 2020-05-11 DIAGNOSIS — I129 Hypertensive chronic kidney disease with stage 1 through stage 4 chronic kidney disease, or unspecified chronic kidney disease: Secondary | ICD-10-CM | POA: Diagnosis not present

## 2020-05-11 DIAGNOSIS — E7800 Pure hypercholesterolemia, unspecified: Secondary | ICD-10-CM | POA: Diagnosis not present

## 2020-05-11 DIAGNOSIS — N401 Enlarged prostate with lower urinary tract symptoms: Secondary | ICD-10-CM | POA: Diagnosis not present

## 2020-05-11 DIAGNOSIS — N183 Chronic kidney disease, stage 3 unspecified: Secondary | ICD-10-CM | POA: Diagnosis not present

## 2020-05-11 DIAGNOSIS — E1122 Type 2 diabetes mellitus with diabetic chronic kidney disease: Secondary | ICD-10-CM | POA: Diagnosis not present

## 2020-05-11 DIAGNOSIS — I2510 Atherosclerotic heart disease of native coronary artery without angina pectoris: Secondary | ICD-10-CM | POA: Diagnosis not present

## 2020-05-11 DIAGNOSIS — M1812 Unilateral primary osteoarthritis of first carpometacarpal joint, left hand: Secondary | ICD-10-CM | POA: Diagnosis not present

## 2020-05-11 DIAGNOSIS — I252 Old myocardial infarction: Secondary | ICD-10-CM | POA: Diagnosis not present

## 2020-05-11 DIAGNOSIS — N1832 Chronic kidney disease, stage 3b: Secondary | ICD-10-CM | POA: Diagnosis not present

## 2020-05-18 DIAGNOSIS — N1832 Chronic kidney disease, stage 3b: Secondary | ICD-10-CM | POA: Diagnosis not present

## 2020-05-18 DIAGNOSIS — E1122 Type 2 diabetes mellitus with diabetic chronic kidney disease: Secondary | ICD-10-CM | POA: Diagnosis not present

## 2020-05-18 DIAGNOSIS — N401 Enlarged prostate with lower urinary tract symptoms: Secondary | ICD-10-CM | POA: Diagnosis not present

## 2020-05-18 DIAGNOSIS — I129 Hypertensive chronic kidney disease with stage 1 through stage 4 chronic kidney disease, or unspecified chronic kidney disease: Secondary | ICD-10-CM | POA: Diagnosis not present

## 2020-05-18 DIAGNOSIS — Z794 Long term (current) use of insulin: Secondary | ICD-10-CM | POA: Diagnosis not present

## 2020-05-18 DIAGNOSIS — Z1389 Encounter for screening for other disorder: Secondary | ICD-10-CM | POA: Diagnosis not present

## 2020-05-18 DIAGNOSIS — H532 Diplopia: Secondary | ICD-10-CM | POA: Diagnosis not present

## 2020-05-18 DIAGNOSIS — Z0000 Encounter for general adult medical examination without abnormal findings: Secondary | ICD-10-CM | POA: Diagnosis not present

## 2020-05-19 ENCOUNTER — Other Ambulatory Visit (INDEPENDENT_AMBULATORY_CARE_PROVIDER_SITE_OTHER): Payer: Medicare HMO

## 2020-05-19 ENCOUNTER — Encounter: Payer: Self-pay | Admitting: Neurology

## 2020-05-19 ENCOUNTER — Telehealth: Payer: Self-pay

## 2020-05-19 ENCOUNTER — Other Ambulatory Visit: Payer: Self-pay

## 2020-05-19 ENCOUNTER — Ambulatory Visit: Payer: Medicare HMO | Admitting: Neurology

## 2020-05-19 VITALS — BP 145/82 | HR 79 | Resp 18 | Ht 64.0 in | Wt 162.0 lb

## 2020-05-19 DIAGNOSIS — H4921 Sixth [abducent] nerve palsy, right eye: Secondary | ICD-10-CM

## 2020-05-19 DIAGNOSIS — E119 Type 2 diabetes mellitus without complications: Secondary | ICD-10-CM

## 2020-05-19 DIAGNOSIS — R519 Headache, unspecified: Secondary | ICD-10-CM

## 2020-05-19 LAB — SEDIMENTATION RATE: Sed Rate: 3 mm/hr (ref 0–20)

## 2020-05-19 LAB — TSH: TSH: 2.17 u[IU]/mL (ref 0.35–4.50)

## 2020-05-19 NOTE — Patient Instructions (Signed)
1.  Headache may have been side effect of Ozempic. 2.  The double vision may have been due to a tiny stroke to the nerve that controls eye movements related to diabetes.  I still want to check other causes anyway  1.  Check CTA of head. 2.  Check sed rate and TSH 3.  Follow up in 4 to 6 months.

## 2020-05-19 NOTE — Telephone Encounter (Signed)
Pt advised of his lab results

## 2020-05-19 NOTE — Telephone Encounter (Signed)
-----   Message from Drema Dallas, DO sent at 05/19/2020 12:25 PM EST ----- Labs normal

## 2020-05-19 NOTE — Progress Notes (Addendum)
NEUROLOGY CONSULTATION NOTE  Scott Wilkins MRN: [id number] DOB: 15-Sep-1940  Referring provider: Quintella Reichert, MD (ED referral) Primary care provider: Lavone Orn, MD  Reason for consult:  Headache, diplopia   Subjective:  Scott Wilkins is a 79 year old left-handed male with type 2 diabetes mellitus, CAD, HTN, CKD and remote history of seizures and head trauma status post craniotomy who presents for headache and diplopia.  History supplemented by ED note.  79 years old seizures for many years asleep since 74-16 years old.    Left sided severe, nausea, blurred vision/double vision, right eye adducted.  Headaches lasted since first October but doubled.  4 crane last headche  1  Week couple of weeks ago.  No prior history of these headaches but prior headaches.  Skewed double vision  He developed a new headache in October 2021, described as a severe left sided persistent headache of gradual onset with nausea.  He also developed binocular horizontal diplopia, slightly skewed.  Symptoms started after he doubled his dose of Ozempic.  After two weeks, he went to the ED on 04/22/2020 for further evaluation.  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   CBC showed stable thrombocytopenia (PLT 114) and renal insufficiency (BUN/Cr 26/1.41).  He was treated with a headache cocktail and discharged with outpatient neurology follow-up.  He followed up with his eye doctor who noted right 6th nerve palsy and was advised to wear a patch on his left eye to strengthen the eye.  Headaches have since improved.   Patient has type 2 diabetes mellitus.  Hgb A1c from 04/08/2020 was 8.  PAST MEDICAL HISTORY: Past Medical History:  Diagnosis Date  . CAD (coronary artery disease)    a. 06/2014 Lateral STEMI/PCI: LM  nl, LAD 90d, small, D1 95 (PTCA only), LCX 90p (3.5 x 16 Promus DES), RCA dominant, 81m RPDA 53m . CKD (chronic kidney disease), stage III ([facility]  . Diabetes mellitus   . Hyperlipidemia   . Hypertension   . Rosacea   . [place]iz[place]   h/o - none in years    PAST SURGICAL HISTORY: Past Surgical History:  Procedure Laterality Date  . CRANIOTOMY  1948   left craniectomy after head trauma  . LEFT HEART CATHETERIZATION WITH CORONARY ANGIOGRAM N/A 07/04/2014   Procedure: LEFT HEART CATHETERIZATION WITH CORONARY ANGIOGRAM;  Surgeon: Peter M JoMartiniqueMD;  [facility];  Service: Cardiovascular;  Laterality: N/A;  . PERCUTANEOUS CORONARY STENT INTERVENTION (PCI-S)  07/04/2014   Procedure: PERCUTANEOUS CORONARY STENT INTERVENTION (PCI-S);  Surgeon: Peter M JoMartiniqueMD;  [facility];  Service: Cardiovascular;;  prox CFX  . RIB FRACTURE SURGERY  1976   ORIF after frcture due to trauma    MEDICATIONS: Current Outpatient Medications on File Prior to Visit  Medication Sig Dispense Refill  . Accu-Chek Softclix Lancets lancets 1 each by Other route 2 (two) times daily. E11.9 (Patient not taking: Reported on 04/27/2020) 200 each 0  . aspirin 81 MG chewable tablet Chew 1 tablet (81 mg total) by mouth daily. (Patient not taking: Reported on 04/27/2020)    . atorvastatin (LIPITOR) 80 MG tablet Take 1 tablet (80 mg total) by mouth daily at 6 PM. (Patient not taking: Reported on 04/27/2020) 90 tablet 3  . Blood Glucose Calibration (ACCU-CHEK AVIVA) SOLN 1  each by Other route as needed (Use to calibrate glucometer). E11.9 (Patient not taking: Reported on 04/27/2020) 1 each 0  . Blood Glucose Monitoring Suppl (ACCU-CHEK AVIVA PLUS) w/Device KIT 1 each by Does not apply route 2 (two) times daily. E11.9 (Patient not taking: Reported on 04/27/2020) 1 kit 0  . carvedilol (COREG) 3.125 MG tablet TAKE 1 TABLET TWICE DAILY WITH MEALS (Patient not taking: Reported on 04/27/2020) 180 tablet 1  .  glimepiride (AMARYL) 2 MG tablet TAKE 1 TABLET TWICE DAILY (Patient not taking: Reported on 04/27/2020) 180 tablet 3  . glucose blood (ACCU-CHEK AVIVA PLUS) test strip 1 each by Other route 2 (two) times daily. E11.9 (Patient not taking: Reported on 04/27/2020) 200 each 0  . insulin NPH-regular Human (70-30) 100 UNIT/ML injection Inject 10 Units into the skin daily with breakfast. 20 mL 11  . lisinopril (ZESTRIL) 2.5 MG tablet Take 1 tablet (2.5 mg total) by mouth daily. 90 tablet 3  . nitroGLYCERIN (NITROSTAT) 0.4 MG SL tablet Place 1 tablet (0.4 mg total) under the tongue every 5 (five) minutes x 3 doses as needed for chest pain. (Patient not taking: Reported on 04/27/2020) 25 tablet 11  . Simethicone (GAS-X PO) Take 1 tablet by mouth as needed. (Patient not taking: Reported on 04/27/2020)    . tamsulosin (FLOMAX) 0.4 MG CAPS capsule Take 1 capsule (0.4 mg total) by mouth daily. (Patient not taking: Reported on 04/27/2020) 30 capsule 3   No current facility-administered medications on file prior to visit.    ALLERGIES: No Known Allergies  FAMILY HISTORY: Family History  Problem Relation Age of Onset  . Heart disease Father        CAD - died @ 24 of MI  . Heart disease Mother        CAD  . Heart disease Brother        CAD - died @ 30 of MI  . Heart disease Brother        CAD  . Cancer Neg Hx   . Diabetes Neg Hx   . COPD Neg Hx     SOCIAL HISTORY: Social History   Socioeconomic History  . Marital status: Married    Spouse name: Not on file  . Number of children: 3  . Years of education: 8  . Highest education level: Not on file  Occupational History  . Occupation: truck Geophysicist/field seismologist    Comment: retired  Tobacco Use  . Smoking status: Never Smoker  . Smokeless tobacco: Never Used  Vaping Use  . Vaping Use: Never used  Substance and Sexual Activity  . Alcohol use: No  . Drug use: No  . Sexual activity: Never  Other Topics Concern  . Not on file  Social History Narrative    Finished 8th grade. Married '64. 3 dtrs. 5 Grandchildren. 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   Social Determinants of Health   Financial Resource Strain:   . Difficulty of Paying Living Expenses: Not on file  Food Insecurity:   . Worried About Charity fundraiser in the Last Year: Not on file  . Ran Out of Food in the Last Year: Not on file  Transportation Needs:   . Lack of Transportation (Medical): Not on file  . Lack of Transportation (Non-Medical): Not on file  Physical Activity:   . Days of Exercise per Week: Not on file  . Minutes of Exercise per Session: Not on file  Stress:   .  Feeling of Stress : Not on file  Social Connections:   . Frequency of Communication with Friends and Family: Not on file  . Frequency of Social Gatherings with Friends and Family: Not on file  . Attends Religious Services: Not on file  . Active Member of Clubs or Organizations: Not on file  . Attends Archivist Meetings: Not on file  . Marital Status: Not on file  Intimate Partner Violence:   . Fear of Current or Ex-Partner: Not on file  . Emotionally Abused: Not on file  . Physically Abused: Not on file  . Sexually Abused: Not on file    Objective:  Blood pressure (!) 145/82, pulse 79, resp. rate 18, height 5' 4"  (1.626 m), weight 162 lb (73.5 kg), SpO2 95 %. General: No acute distress.  Patient appears well-groomed.   Head:  Normocephalic/atraumatic Eyes:  fundi examined but not visualized Neck: supple, no paraspinal tenderness, full range of motion Back: No paraspinal tenderness Heart: regular rate and rhythm Lungs: Clear to auscultation bilaterally. Vascular: No carotid bruits. Neurological Exam: Mental status: alert and oriented to person, place, and time, recent and remote memory intact, fund of knowledge intact, attention and concentration intact, speech fluent and not dysarthric, language intact. Cranial nerves: CN I: not tested CN II: pupils  equal, round and reactive to light, visual fields intact CN III, IV, VI:  full range of motion, no nystagmus, no ptosis CN V: facial sensation intact. CN VII: upper and lower face symmetric CN VIII: hearing intact CN IX, X: gag intact, uvula midline CN XI: sternocleidomastoid and trapezius muscles intact CN XII: tongue midline Bulk & Tone: normal, no fasciculations. Motor:  muscle strength 5/5 throughout Sensation:  Pinprick and vibratory sensation reduced in right upper and lower extremities, slightly reduced vibratory sensation in left foot Deep tendon reflexes:  2+ throughout,  toes downgoing.   Finger to nose testing:  Without dysmetria.   Heel to shin:  Without dysmetria.   Gait:  Normal station and stride.  Romberg negative.  Assessment/Plan:   1.  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  Type 2 diabetes mellitus, uncontrolled The abducens nerve palsy is likely ischemic, secondary to diabetes and hypertension, in which case it should resolve (which I suspect has already happened as I do not appreciate any significant ophthalmoplegia on exam).  The headache may have been side effect of Ozempic.  However, given the new headache, I would still want to evaluate for cerebral aneurysm and possibly temporal arteritis.  1.  Check MRA of head 2.  Check sed rate and TSH 3.  Optimize glycemic control 4.  Follow up 4 to 6 months.    Thank you for allowing me to take part in the care of this patient.  Metta Clines, DO  CC: Lavone Orn, MD

## 2020-05-20 ENCOUNTER — Other Ambulatory Visit: Payer: Self-pay | Admitting: Neurology

## 2020-05-20 DIAGNOSIS — H4921 Sixth [abducent] nerve palsy, right eye: Secondary | ICD-10-CM

## 2020-05-21 ENCOUNTER — Ambulatory Visit: Payer: Medicare HMO | Admitting: Endocrinology

## 2020-05-21 ENCOUNTER — Other Ambulatory Visit: Payer: Self-pay

## 2020-05-21 ENCOUNTER — Encounter: Payer: Self-pay | Admitting: Endocrinology

## 2020-05-21 VITALS — HR 75 | Ht 64.0 in | Wt 162.0 lb

## 2020-05-21 DIAGNOSIS — M1A00X0 Idiopathic chronic gout, unspecified site, without tophus (tophi): Secondary | ICD-10-CM | POA: Insufficient documentation

## 2020-05-21 DIAGNOSIS — R109 Unspecified abdominal pain: Secondary | ICD-10-CM | POA: Insufficient documentation

## 2020-05-21 DIAGNOSIS — E119 Type 2 diabetes mellitus without complications: Secondary | ICD-10-CM | POA: Diagnosis not present

## 2020-05-21 DIAGNOSIS — L719 Rosacea, unspecified: Secondary | ICD-10-CM | POA: Insufficient documentation

## 2020-05-21 DIAGNOSIS — R400 Somnolence: Secondary | ICD-10-CM | POA: Insufficient documentation

## 2020-05-21 DIAGNOSIS — R3911 Hesitancy of micturition: Secondary | ICD-10-CM | POA: Insufficient documentation

## 2020-05-21 DIAGNOSIS — K760 Fatty (change of) liver, not elsewhere classified: Secondary | ICD-10-CM | POA: Insufficient documentation

## 2020-05-21 DIAGNOSIS — N401 Enlarged prostate with lower urinary tract symptoms: Secondary | ICD-10-CM | POA: Insufficient documentation

## 2020-05-21 DIAGNOSIS — I2510 Atherosclerotic heart disease of native coronary artery without angina pectoris: Secondary | ICD-10-CM | POA: Insufficient documentation

## 2020-05-21 DIAGNOSIS — E1121 Type 2 diabetes mellitus with diabetic nephropathy: Secondary | ICD-10-CM | POA: Insufficient documentation

## 2020-05-21 DIAGNOSIS — R413 Other amnesia: Secondary | ICD-10-CM | POA: Insufficient documentation

## 2020-05-21 DIAGNOSIS — M19049 Primary osteoarthritis, unspecified hand: Secondary | ICD-10-CM | POA: Insufficient documentation

## 2020-05-21 DIAGNOSIS — I252 Old myocardial infarction: Secondary | ICD-10-CM | POA: Insufficient documentation

## 2020-05-21 DIAGNOSIS — N183 Chronic kidney disease, stage 3 unspecified: Secondary | ICD-10-CM | POA: Insufficient documentation

## 2020-05-21 DIAGNOSIS — E162 Hypoglycemia, unspecified: Secondary | ICD-10-CM | POA: Insufficient documentation

## 2020-05-21 DIAGNOSIS — G40909 Epilepsy, unspecified, not intractable, without status epilepticus: Secondary | ICD-10-CM | POA: Insufficient documentation

## 2020-05-21 DIAGNOSIS — I129 Hypertensive chronic kidney disease with stage 1 through stage 4 chronic kidney disease, or unspecified chronic kidney disease: Secondary | ICD-10-CM | POA: Insufficient documentation

## 2020-05-21 LAB — POCT GLYCOSYLATED HEMOGLOBIN (HGB A1C): Hemoglobin A1C: 7.8 % — AB (ref 4.0–5.6)

## 2020-05-21 MED ORDER — INSULIN NPH ISOPHANE & REGULAR (70-30) 100 UNIT/ML ~~LOC~~ SUSP
22.0000 [IU] | Freq: Every day | SUBCUTANEOUS | 11 refills | Status: DC
Start: 2020-05-21 — End: 2020-07-22

## 2020-05-21 NOTE — Patient Instructions (Addendum)
check your blood sugar twice a day.  vary the time of day when you check, between before the 3 meals, and at bedtime.  also check if you have symptoms of your blood sugar being too high or too low.  please keep a record of the readings and bring it to your next appointment here (or you can bring the meter itself).  You can write it on any piece of paper.  please call us sooner if your blood sugar goes below 70, or if you have a lot of readings over 200.   We will need to take this complex situation in stages.   Increase the insulin to 22 units each morning (but take just 12 units if you are going to be active), and:  Please continue the same glimepiride (we'll phase this out later).   Please come back for a follow-up appointment in 2 months.

## 2020-05-21 NOTE — Progress Notes (Signed)
Subjective:    Patient ID: Scott Wilkins, male    DOB: [birth year], 79 y.o.   MRN: [id number]  HPI Pt returns for f/u of diabetes mellitus: DM type: Insulin-requiring type 2 Dx'ed: 0929 Complications: PN, CRI, CN4 MN, and CAD.   Therapy: insulin since 2016, and glimepiride.   DKA: never Severe hypoglycemia: never Pancreatitis: never Pancreatic imaging: normal on 2001 CT SDOH: none Other: he declines multiple daily injections; Ozempic was d/c'ed, due to headache.   Interval history: He brings a record of his cbg's which I have reviewed today.  cbg varies from 57-255.  There is no trend throughout the day.  It only goes low with activity.   Past Medical History:  Diagnosis Date  . CAD (coronary artery disease)    a. 06/2014 Lateral STEMI/PCI: LM nl, LAD 90d, small, D1 95 (PTCA only), LCX 90p (3.5 x 16 Promus DES), RCA dominant, 29m RPDA 558m . CKD (chronic kidney disease), stage III ([facility]  . Diabetes mellitus   . Hyperlipidemia   . Hypertension   . Rosacea   . Seizures ([facility]   h/o - none in years    Past Surgical History:  Procedure Laterality Date  . CRANIOTOMY  1948   left craniectomy after head trauma  . LEFT HEART CATHETERIZATION WITH CORONARY ANGIOGRAM N/A 07/04/2014   Procedure: LEFT HEART CATHETERIZATION WITH CORONARY ANGIOGRAM;  Surgeon: Peter M JoMartiniqueMD;  [facility];  Service: Cardiovascular;  Laterality: N/A;  . PERCUTANEOUS CORONARY STENT INTERVENTION (PCI-S)  07/04/2014   Procedure: PERCUTANEOUS CORONARY STENT INTERVENTION (PCI-S);  Surgeon: Peter M JoMartiniqueMD;  [facility];  Service: Cardiovascular;;  prox CFX  . RIB FRACTURE SURGERY  1976   ORIF after frcture due to trauma    Social History   Socioeconomic History  . Marital status: Married    Spouse name: Not on file  . Number of children: 3  . Years of education: 8  . Highest education level: Not on file  Occupational History  . Occupation: truck drGeophysicist/field seismologist  Comment: retired   Tobacco Use  . Smoking status: Never Smoker  . Smokeless tobacco: Never Used  Vaping Use  . Vaping Use: Never used  Substance and Sexual Activity  . Alcohol use: No  . Drug use: No  . Sexual activity: Never  Other Topics Concern  . Not on file  Social History Narrative   Finished 8th grade. Married '64. 3 dtrs. 5 Grandchildren. 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 lisinopril (ZESTRIL) 5 MG tablet 1 tablet    . Accu-Chek Softclix Lancets lancets 1 each by Other route 2 (two) times daily. E11.9 200 each 0  . aspirin 81 MG chewable tablet Chew 1 tablet (81 mg total) by mouth daily.    . Marland Kitchentorvastatin (LIPITOR) 80 MG tablet Take 1 tablet (80 mg total) by mouth daily at 6 PM. 90 tablet 3  . Blood Glucose Calibration (ACCU-CHEK AVIVA) SOLN 1 each by Other route  as needed (Use to calibrate glucometer). E11.9 1 each 0  . Blood Glucose Monitoring Suppl (ACCU-CHEK AVIVA PLUS) w/Device KIT 1 each by Does not apply route 2 (two) times daily. E11.9 1 kit 0  . carvedilol (COREG) 3.125 MG tablet TAKE 1 TABLET TWICE DAILY WITH MEALS 180 tablet 1  . glimepiride (AMARYL) 2 MG tablet TAKE 1 TABLET TWICE DAILY 180 tablet 3  . glucose blood (ACCU-CHEK AVIVA PLUS) test strip 1 each by Other route 2 (two) times daily. E11.9 200 each 0  . lisinopril (ZESTRIL) 2.5 MG tablet Take 1 tablet (2.5 mg total) by mouth daily. 90 tablet 3  . nitroGLYCERIN (NITROSTAT) 0.4 MG SL tablet Place 1 tablet (0.4 mg total) under the tongue every 5 (five) minutes x 3 doses as needed for chest pain. 25  tablet 11  . Simethicone (GAS-X PO) Take 1 tablet by mouth as needed.     . tamsulosin (FLOMAX) 0.4 MG CAPS capsule Take 1 capsule (0.4 mg total) by mouth daily. 30 capsule 3   No current facility-administered medications on file prior to visit.    Allergies  Allergen Reactions  . Metformin And Related     Abdominal pain, diarrhea  . Other Other (See Comments)  . Ozempic (0.25 Or 0.5 Mg-Dose) [Semaglutide(0.25 Or 0.79m-Dos)]     Nausea, headache, vision    Family History  Problem Relation Age of Onset  . Heart disease Father        CAD - died @ 73of MI  . Heart disease Mother        CAD  . Heart disease Brother        CAD - died @ 772of MI  . Heart disease Brother        CAD  . Cancer Neg Hx   . Diabetes Neg Hx   . COPD Neg Hx     Pulse 75   Ht _0  (1.626 m)   Wt 162 lb (73.5 kg)   SpO2 95%   BMI 27.81 kg/m    Review of Systems Denies LOC    Objective:   Physical Exam VITAL SIGNS:  See vs page GENERAL: no distress Pulses: dorsalis pedis intact bilat.   MSK: no deformity of the feet CV: no leg edema Skin:  no ulcer on the feet.  normal color and temp on the feet. Neuro: sensation is intact to touch on the feet.    Lab Results  Component Value Date   HGBA1C 7.8 (A) 05/21/2020   Lab Results  Component Value Date   CREATININE 1.41 (H) 04/22/2020   BUN 26 (H) 04/22/2020   NA 139 04/22/2020   K 3.7 04/22/2020   CL 105 04/22/2020   CO2 25 04/22/2020      Assessment & Plan:  Insulin-requiring type 2 DM, with CRI: uncontrolled.  Hypoglycemia, due to insulin: we'll further adjust the insulin for activity  Patient Instructions  check your blood sugar twice a day.  vary the time of day when you check, between before the 3 meals, and at bedtime.  also check if you have symptoms of your blood sugar being too high or too low.  please keep a record of the readings and bring it to your next appointment here (or you can bring the meter itself).  You can write  it on any piece of paper.  please call uKoreasooner if your blood sugar goes below 70, or if you have a lot of readings over  200.   We will need to take this complex situation in stages.   Increase the insulin to 22 units each morning (but take just 12 units if you are going to be active), and:  Please continue the same glimepiride (we'll phase this out later).   Please come back for a follow-up appointment in 2 months.

## 2020-05-22 ENCOUNTER — Ambulatory Visit
Admission: RE | Admit: 2020-05-22 | Discharge: 2020-05-22 | Disposition: A | Discharge status: Home / Self Care | Payer: Medicare HMO | Source: Ambulatory Visit | Attending: Neurology | Admitting: Neurology

## 2020-05-22 DIAGNOSIS — G9389 Other specified disorders of brain: Secondary | ICD-10-CM | POA: Diagnosis not present

## 2020-05-22 DIAGNOSIS — H4921 Sixth [abducent] nerve palsy, right eye: Secondary | ICD-10-CM

## 2020-05-22 DIAGNOSIS — H532 Diplopia: Secondary | ICD-10-CM | POA: Diagnosis not present

## 2020-05-22 DIAGNOSIS — S0990XA Unspecified injury of head, initial encounter: Secondary | ICD-10-CM | POA: Diagnosis not present

## 2020-06-01 ENCOUNTER — Other Ambulatory Visit: Payer: Self-pay | Admitting: Cardiology

## 2020-07-09 ENCOUNTER — Ambulatory Visit: Payer: Medicare HMO | Admitting: Neurology

## 2020-07-09 DIAGNOSIS — I10 Essential (primary) hypertension: Secondary | ICD-10-CM | POA: Diagnosis not present

## 2020-07-09 DIAGNOSIS — E7800 Pure hypercholesterolemia, unspecified: Secondary | ICD-10-CM | POA: Diagnosis not present

## 2020-07-09 DIAGNOSIS — N401 Enlarged prostate with lower urinary tract symptoms: Secondary | ICD-10-CM | POA: Diagnosis not present

## 2020-07-09 DIAGNOSIS — I129 Hypertensive chronic kidney disease with stage 1 through stage 4 chronic kidney disease, or unspecified chronic kidney disease: Secondary | ICD-10-CM | POA: Diagnosis not present

## 2020-07-09 DIAGNOSIS — N1832 Chronic kidney disease, stage 3b: Secondary | ICD-10-CM | POA: Diagnosis not present

## 2020-07-09 DIAGNOSIS — I2510 Atherosclerotic heart disease of native coronary artery without angina pectoris: Secondary | ICD-10-CM | POA: Diagnosis not present

## 2020-07-09 DIAGNOSIS — E1122 Type 2 diabetes mellitus with diabetic chronic kidney disease: Secondary | ICD-10-CM | POA: Diagnosis not present

## 2020-07-09 DIAGNOSIS — I252 Old myocardial infarction: Secondary | ICD-10-CM | POA: Diagnosis not present

## 2020-07-13 ENCOUNTER — Ambulatory Visit: Payer: Medicare HMO | Admitting: Cardiology

## 2020-07-20 ENCOUNTER — Other Ambulatory Visit: Payer: Self-pay

## 2020-07-22 ENCOUNTER — Other Ambulatory Visit: Payer: Self-pay

## 2020-07-22 ENCOUNTER — Ambulatory Visit: Payer: Medicare HMO | Admitting: Endocrinology

## 2020-07-22 VITALS — BP 140/70 | HR 67 | Ht 64.0 in | Wt 163.4 lb

## 2020-07-22 DIAGNOSIS — E119 Type 2 diabetes mellitus without complications: Secondary | ICD-10-CM | POA: Diagnosis not present

## 2020-07-22 LAB — POCT GLYCOSYLATED HEMOGLOBIN (HGB A1C): Hemoglobin A1C: 8.8 % — AB (ref 4.0–5.6)

## 2020-07-22 MED ORDER — INSULIN NPH ISOPHANE & REGULAR (70-30) 100 UNIT/ML ~~LOC~~ SUSP
SUBCUTANEOUS | 11 refills | Status: DC
Start: 2020-07-22 — End: 2020-09-24

## 2020-07-22 NOTE — Patient Instructions (Addendum)
check your blood sugar twice a day.  vary the time of day when you check, between before the 3 meals, and at bedtime.  also check if you have symptoms of your blood sugar being too high or too low.  please keep a record of the readings and bring it to your next appointment here (or you can bring the meter itself).  You can write it on any piece of paper.  please call us sooner if your blood sugar goes below 70, or if you have a lot of readings over 200.   We will need to take this complex situation in stages.   For now, please:  Stop taking the glimepiride, and:  Increase the insulin to 35 units with breakfast (but take just 12 units if you are going to be active), and 12 units with the evening meal.  We don't know how much the stoppage of the glimepiride will affect the insulin need, so please call if the blood sugar goes too high or too low.   Please come back for a follow-up appointment in 2 months.

## 2020-07-22 NOTE — Progress Notes (Signed)
Subjective:    Patient ID: Scott Wilkins, male    DOB: [birth year], 80 y.o.   MRN: [id number]  HPI Pt returns for f/u of diabetes mellitus: DM type: Insulin-requiring type 2 Dx'ed: 3845 Complications: PN, CRI, CN4 MN, and CAD.   Therapy: insulin since 2016, and glimepiride.   DKA: never Severe hypoglycemia: never Pancreatitis: never Pancreatic imaging: normal on 2001 CT SDOH: none Other: he declines multiple daily injections; Ozempic was d/c'ed, due to headache.   Interval history: He brings a record of his cbg's which I have reviewed today.  cbg varies from 79-350.  It is in general highest fasting.  He seldom has hypoglycemia, and these episodes are mild.    Pt says insulin is 30 units each morning (but just 12 units if he are going to be active), and 10 units in the evening.  Past Medical History:  Diagnosis Date   CAD (coronary artery disease)    a. 06/2014 Lateral STEMI/PCI: LM nl, LAD 90d, small, D1 95 (PTCA only), LCX 90p (3.5 x 16 Promus DES), RCA dominant, 58m RPDA 524m  CKD (chronic kidney disease), stage III (H[facility])    Diabetes mellitus    Hyperlipidemia    Hypertension    Rosacea    Seizures ([facility]   h/o - none in years    Past Surgical History:  Procedure Laterality Date   CRANIOTOMY  1948   left craniectomy after head trauma   LEFT HEART CATHETERIZATION WITH CORONARY ANGIOGRAM N/A 07/04/2014   Procedure: LEFT HEART CATHETERIZATION WITH CORONARY ANGIOGRAM;  Surgeon: Peter M JoMartiniqueMD;  [facility];  Service: Cardiovascular;  Laterality: N/A;   PERCUTANEOUS CORONARY STENT INTERVENTION (PCI-S)  07/04/2014   Procedure: PERCUTANEOUS CORONARY STENT INTERVENTION (PCI-S);  Surgeon: Peter M JoMartiniqueMD;  [facility];  Service: Cardiovascular;;  prox CFX   RIB FRACTURE SURGERY  1976   ORIF after frcture due to trauma    Social History   Socioeconomic History   Marital status: Married    Spouse name: Not on file   Number of  children: 3   Years of education: 8   Highest education level: Not on file  Occupational History   Occupation: truck driver    Comment: retired  Tobacco Use   Smoking status: Never Smoker   Smokeless tobacco: Never Used  VaScientific laboratory technicianse: Never used  Substance and Sexual Activity   Alcohol use: No   Drug use: No   Sexual activity: Never  Other Topics Concern   Not on file  Social History Narrative   Finished 8th grade. Married '64. 3 dtrs. 5 Grandchildren. 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 E11.9 200 each 0   aspirin 81 MG chewable tablet Chew 1 tablet (81 mg total) by mouth daily.     atorvastatin (LIPITOR) 80 MG tablet TAKE 1 TABLET  DAILY AT 6 PM. 90 tablet  3   Blood Glucose Calibration (ACCU-CHEK AVIVA) SOLN 1 each by Other route as needed (Use to calibrate glucometer). E11.9 1 each 0   Blood Glucose Monitoring Suppl (ACCU-CHEK AVIVA PLUS) w/Device KIT 1 each by Does not apply route 2 (two) times daily. E11.9 1 kit 0   carvedilol (COREG) 3.125 MG tablet TAKE 1 TABLET TWICE DAILY WITH MEALS 180 tablet 1   glucose blood (ACCU-CHEK AVIVA PLUS) test strip 1 each by Other route 2 (two) times daily. E11.9 200 each 0   lisinopril (ZESTRIL) 2.5 MG tablet Take 1 tablet (2.5 mg total) by mouth daily. 90 tablet 3   lisinopril (ZESTRIL) 5 MG tablet 1 tablet     nitroGLYCERIN (NITROSTAT) 0.4 MG SL tablet Place 1 tablet (0.4 mg total) under the tongue every 5  (five) minutes x 3 doses as needed for chest pain. 25 tablet 11   Simethicone (GAS-X PO) Take 1 tablet by mouth as needed.      tamsulosin (FLOMAX) 0.4 MG CAPS capsule Take 1 capsule (0.4 mg total) by mouth daily. 30 capsule 3   No current facility-administered medications on file prior to visit.    Allergies  Allergen Reactions   Metformin And Related     Abdominal pain, diarrhea   Other Other (See Comments)   Ozempic (0.25 Or 0.5 Mg-Dose) [Semaglutide(0.25 Or 0.36m-Dos)]     Nausea, headache, vision    Family History  Problem Relation Age of Onset   Heart disease Father        CAD - died @ 714of MI   Heart disease Mother        CAD   Heart disease Brother        CAD - died @ 78of MI   Heart disease Brother        CAD   Cancer Neg Hx    Diabetes Neg Hx    COPD Neg Hx     BP 140/70 (BP Location: Right Arm, Patient Position: Sitting, Cuff Size: Normal)    Pulse 67    Ht 5' 4"  (1.626 m)    Wt 163 lb 6.4 oz (74.1 kg)    SpO2 95%    BMI 28.05 kg/m   Review of Systems Denies LOC    Objective:   Physical Exam VITAL SIGNS:  See vs page GENERAL: no distress Pulses: dorsalis pedis intact bilat.   MSK: no deformity of the feet CV: no leg edema Skin:  no ulcer on the feet.  normal color and temp on the feet. Neuro: sensation is intact to touch on the feet    Lab Results  Component Value Date   HGBA1C 8.8 (A) 07/22/2020       Assessment & Plan:  Insulin-requiring type 2 DM, with CRI: uncontrolled.   Patient Instructions  check your blood sugar twice a day.  vary the time of day when you check, between before the 3 meals, and at bedtime.  also check if you have symptoms of your blood sugar being too high or too low.  please keep a record of the readings and bring it to your next appointment here (or you can bring the meter itself).  You can write it on any piece of paper.  please call uKoreasooner if your blood sugar goes below 70, or if you have a lot of  readings over 200.   We will need to take this complex situation in stages.   For now, please:  Stop  taking the glimepiride, and:  Increase the insulin to 35 units with breakfast (but take just 12 units if you are going to be active), and 12 units with the evening meal.  We don't know how much the stoppage of the glimepiride will affect the insulin need, so please call if the blood sugar goes too high or too low.   Please come back for a follow-up appointment in 2 months.

## 2020-07-29 DIAGNOSIS — H532 Diplopia: Secondary | ICD-10-CM | POA: Diagnosis not present

## 2020-08-14 DIAGNOSIS — R3911 Hesitancy of micturition: Secondary | ICD-10-CM | POA: Diagnosis not present

## 2020-08-14 DIAGNOSIS — N401 Enlarged prostate with lower urinary tract symptoms: Secondary | ICD-10-CM | POA: Diagnosis not present

## 2020-08-14 DIAGNOSIS — R3912 Poor urinary stream: Secondary | ICD-10-CM | POA: Diagnosis not present

## 2020-08-25 DIAGNOSIS — N1832 Chronic kidney disease, stage 3b: Secondary | ICD-10-CM | POA: Diagnosis not present

## 2020-08-25 DIAGNOSIS — E1122 Type 2 diabetes mellitus with diabetic chronic kidney disease: Secondary | ICD-10-CM | POA: Diagnosis not present

## 2020-08-25 DIAGNOSIS — I129 Hypertensive chronic kidney disease with stage 1 through stage 4 chronic kidney disease, or unspecified chronic kidney disease: Secondary | ICD-10-CM | POA: Diagnosis not present

## 2020-08-25 DIAGNOSIS — I2510 Atherosclerotic heart disease of native coronary artery without angina pectoris: Secondary | ICD-10-CM | POA: Diagnosis not present

## 2020-08-25 DIAGNOSIS — M1812 Unilateral primary osteoarthritis of first carpometacarpal joint, left hand: Secondary | ICD-10-CM | POA: Diagnosis not present

## 2020-08-25 DIAGNOSIS — N183 Chronic kidney disease, stage 3 unspecified: Secondary | ICD-10-CM | POA: Diagnosis not present

## 2020-08-25 DIAGNOSIS — I252 Old myocardial infarction: Secondary | ICD-10-CM | POA: Diagnosis not present

## 2020-08-25 DIAGNOSIS — N401 Enlarged prostate with lower urinary tract symptoms: Secondary | ICD-10-CM | POA: Diagnosis not present

## 2020-08-25 DIAGNOSIS — E7800 Pure hypercholesterolemia, unspecified: Secondary | ICD-10-CM | POA: Diagnosis not present

## 2020-09-24 ENCOUNTER — Other Ambulatory Visit: Payer: Self-pay

## 2020-09-24 ENCOUNTER — Ambulatory Visit (INDEPENDENT_AMBULATORY_CARE_PROVIDER_SITE_OTHER): Payer: Medicare HMO | Admitting: Endocrinology

## 2020-09-24 VITALS — BP 140/60 | HR 64 | Ht 64.0 in | Wt 162.2 lb

## 2020-09-24 DIAGNOSIS — E119 Type 2 diabetes mellitus without complications: Secondary | ICD-10-CM

## 2020-09-24 LAB — POCT GLYCOSYLATED HEMOGLOBIN (HGB A1C): Hemoglobin A1C: 8.8 % — AB (ref 4.0–5.6)

## 2020-09-24 MED ORDER — INSULIN NPH ISOPHANE & REGULAR (70-30) 100 UNIT/ML ~~LOC~~ SUSP
SUBCUTANEOUS | 11 refills | Status: DC
Start: 2020-09-24 — End: 2021-10-12

## 2020-09-24 MED ORDER — TRULICITY 0.75 MG/0.5ML ~~LOC~~ SOAJ: 0.7500 mg | SUBCUTANEOUS | 3 refills | Status: DC

## 2020-09-24 NOTE — Progress Notes (Signed)
Subjective:    Patient ID: Scott Wilkins, male    DOB: [birth year], 80 y.o.   MRN: [id number]  HPI Pt returns for f/u of diabetes mellitus: DM type: Insulin-requiring type 2 Dx'ed: 0352 Complications: PN, CRI, CN4 MN, and CAD.   Therapy: insulin since 2016.   DKA: never Severe hypoglycemia: never Pancreatitis: never Pancreatic imaging: normal on 2001 CT SDOH: none Other: Scott Wilkins declines multiple daily injections; Ozempic was d/c'ed, due to headache.   Interval history: Scott Wilkins brings a record of his cbg's which I have reviewed today.  cbg varies from 55-340.  It is in general highest fasting.  Scott Wilkins seldom has hypoglycemia, and these episodes are mild.  This happens with exertion that Scott Wilkins is unable to anticipate.    Past Medical History:  Diagnosis Date  . CAD (coronary artery disease)    a. 06/2014 Lateral STEMI/PCI: LM nl, LAD 90d, small, D1 95 (PTCA only), LCX 90p (3.5 x 16 Promus DES), RCA dominant, 74m RPDA 547m . CKD (chronic [place] disease), stage III ([facility]  . Diabetes mellitus   . Hyperlipidemia   . Hypertension   . Rosacea   . Seizures ([facility] [place]   h/o - none in years    Past Surgical History:  Procedure Laterality Date  . CRANIOTOMY  1948   left craniectomy after head trauma  . LEFT HEART CATHETERIZATION WITH CORONARY ANGIOGRAM N/A 07/04/2014   Procedure: LEFT HEART CATHETERIZATION WITH CORONARY ANGIOGRAM;  Surgeon: Peter M JoMartiniqueMD;  [facility];  Service: Cardiovascular;  Laterality: N/A;  . PERCUTANEOUS CORONARY STENT INTERVENTION (PCI-S)  07/04/2014   Procedure: PERCUTANEOUS CORONARY STENT INTERVENTION (PCI-S);  Surgeon: Peter M JoMartiniqueMD;  [facility];  Service: Cardiovascular;;  prox CFX  . RIB FRACTURE SURGERY  1976   ORIF after frcture due to trauma    Social History   Socioeconomic History  . Marital status: Married    Spouse name: Not on file  . Number of children: 3  . Years of education: 8  . Highest education level: Not on file   Occupational History  . Occupation: truck drGeophysicist/field seismologist  Comment: retired  Tobacco Use  . Smoking status: Never Smoker  . Smokeless tobacco: Never Used  Vaping Use  . Vaping Use: Never used  Substance and Sexual Activity  . Alcohol use: No  . Drug use: No  . Sexual activity: Never  Other Topics Concern  . Not on file  Social History Narrative   Finished 8th grade. Married '64. 3 dtrs. 5 Grandchildren. 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 Accu-Chek Softclix Lancets lancets 1 each by Other route 2 (two) times daily. E11.9 200 each 0  . aspirin 81 MG chewable tablet Chew 1 tablet (81 mg total) by mouth daily.    . Marland Kitchentorvastatin (LIPITOR) 80 MG tablet TAKE 1 TABLET  DAILY AT 6 PM. 90 tablet 3  . Blood Glucose Calibration (ACCU-CHEK AVIVA) SOLN 1 each by Other route as needed (Use  to calibrate glucometer). E11.9 1 each 0  . Blood Glucose Monitoring Suppl (ACCU-CHEK AVIVA PLUS) w/Device KIT 1 each by Does not apply route 2 (two) times daily. E11.9 1 kit 0  . carvedilol (COREG) 3.125 MG tablet TAKE 1 TABLET TWICE DAILY WITH MEALS 180 tablet 1  . glucose blood (ACCU-CHEK AVIVA PLUS) test strip 1 each by Other route 2 (two) times daily. E11.9 200 each 0  . lisinopril (ZESTRIL) 2.5 MG tablet Take 1 tablet (2.5 mg total) by mouth daily. 90 tablet 3  . lisinopril (ZESTRIL) 5 MG tablet 1 tablet    . nitroGLYCERIN (NITROSTAT) 0.4 MG SL tablet Place 1 tablet (0.4 mg total) under the tongue every 5 (five) minutes x 3 doses as needed for chest pain. 25 tablet 11  .  Simethicone (GAS-X PO) Take 1 tablet by mouth as needed.     . tamsulosin (FLOMAX) 0.4 MG CAPS capsule Take 1 capsule (0.4 mg total) by mouth daily. 30 capsule 3   No current facility-administered medications on file prior to visit.    Allergies  Allergen Reactions  . Metformin And Related     Abdominal pain, diarrhea  . Other Other (See Comments)  . Ozempic (0.25 Or 0.5 Mg-Dose) [Semaglutide(0.25 Or 0.74m-Dos)]     Nausea, headache, vision    Family History  Problem Relation Age of Onset  . Heart disease Father        CAD - died @ 766of MI  . Heart disease Mother        CAD  . Heart disease Brother        CAD - died @ 768of MI  . Heart disease Brother        CAD  . Cancer Neg Hx   . Diabetes Neg Hx   . COPD Neg Hx     BP 140/60 (BP Location: Right Arm, Patient Position: Sitting, Cuff Size: Normal)   Pulse 64   Ht _0  (1.626 m)   Wt 162 lb 3.2 oz (73.6 kg)   SpO2 96%   BMI 27.84 kg/m    Review of Systems     Objective:   Physical Exam VITAL SIGNS:  See vs page GENERAL: no distress Pulses: dorsalis pedis intact bilat.   MSK: no deformity of the feet CV: no leg edema Skin:  no ulcer on the feet.  normal color and temp on the feet. Neuro: sensation is intact to touch on the feet    Lab Results  Component Value Date   HGBA1C 8.8 (A) 09/24/2020       Assessment & Plan:  Insulin-requiring type 2 DM: uncontrolled Hypoglycemia, due to insulin: we'll favor GLP rx.    Patient Instructions  check your blood sugar twice a day.  vary the time of day when you check, between before the 3 meals, and at bedtime.  also check if you have symptoms of your blood sugar being too high or too low.  please keep a record of the readings and bring it to your next appointment here (or you can bring the meter itself).  You can write it on any piece of paper.  please call uKoreasooner if your blood sugar goes below 70, or if you have a lot of readings over 200.   I have sent a  prescription to your pharmacy, to add "Trulicity."  Please reduce the insulin to 20 units with breakfast, and 5 units with the evening meal.  Please come back  for a follow-up appointment in 6 weeks.

## 2020-09-24 NOTE — Patient Instructions (Addendum)
check your blood sugar twice a day.  vary the time of day when you check, between before the 3 meals, and at bedtime.  also check if you have symptoms of your blood sugar being too high or too low.  please keep a record of the readings and bring it to your next appointment here (or you can bring the meter itself).  You can write it on any piece of paper.  please call us sooner if your blood sugar goes below 70, or if you have a lot of readings over 200.   I have sent a prescription to your pharmacy, to add "Trulicity."  Please reduce the insulin to 20 units with breakfast, and 5 units with the evening meal.  Please come back for a follow-up appointment in 6 weeks.

## 2020-10-05 ENCOUNTER — Other Ambulatory Visit: Payer: Self-pay | Admitting: Physician Assistant

## 2020-10-05 DIAGNOSIS — M545 Low back pain, unspecified: Secondary | ICD-10-CM

## 2020-10-05 DIAGNOSIS — R310 Gross hematuria: Secondary | ICD-10-CM | POA: Diagnosis not present

## 2020-10-05 DIAGNOSIS — Z87442 Personal history of urinary calculi: Secondary | ICD-10-CM | POA: Diagnosis not present

## 2020-10-06 ENCOUNTER — Ambulatory Visit
Admission: RE | Admit: 2020-10-06 | Discharge: 2020-10-06 | Disposition: A | Discharge status: Home / Self Care | Payer: Medicare HMO | Source: Ambulatory Visit | Attending: Physician Assistant | Admitting: Physician Assistant

## 2020-10-06 DIAGNOSIS — R109 Unspecified abdominal pain: Secondary | ICD-10-CM | POA: Diagnosis not present

## 2020-10-06 DIAGNOSIS — R310 Gross hematuria: Secondary | ICD-10-CM

## 2020-10-06 DIAGNOSIS — Z87442 Personal history of urinary calculi: Secondary | ICD-10-CM

## 2020-10-06 DIAGNOSIS — M545 Low back pain, unspecified: Secondary | ICD-10-CM

## 2020-10-06 DIAGNOSIS — R319 Hematuria, unspecified: Secondary | ICD-10-CM | POA: Diagnosis not present

## 2020-11-02 DIAGNOSIS — I129 Hypertensive chronic kidney disease with stage 1 through stage 4 chronic kidney disease, or unspecified chronic kidney disease: Secondary | ICD-10-CM | POA: Diagnosis not present

## 2020-11-02 DIAGNOSIS — N183 Chronic kidney disease, stage 3 unspecified: Secondary | ICD-10-CM | POA: Diagnosis not present

## 2020-11-02 DIAGNOSIS — I252 Old myocardial infarction: Secondary | ICD-10-CM | POA: Diagnosis not present

## 2020-11-02 DIAGNOSIS — I2510 Atherosclerotic heart disease of native coronary artery without angina pectoris: Secondary | ICD-10-CM | POA: Diagnosis not present

## 2020-11-02 DIAGNOSIS — E7800 Pure hypercholesterolemia, unspecified: Secondary | ICD-10-CM | POA: Diagnosis not present

## 2020-11-02 DIAGNOSIS — M1812 Unilateral primary osteoarthritis of first carpometacarpal joint, left hand: Secondary | ICD-10-CM | POA: Diagnosis not present

## 2020-11-02 DIAGNOSIS — E1122 Type 2 diabetes mellitus with diabetic chronic kidney disease: Secondary | ICD-10-CM | POA: Diagnosis not present

## 2020-11-02 DIAGNOSIS — N1832 Chronic kidney disease, stage 3b: Secondary | ICD-10-CM | POA: Diagnosis not present

## 2020-11-02 DIAGNOSIS — N401 Enlarged prostate with lower urinary tract symptoms: Secondary | ICD-10-CM | POA: Diagnosis not present

## 2020-11-03 NOTE — Progress Notes (Signed)
Cardiology Office Note   Date:  11/04/2020   ID:  Erikson, Danzy 07-13-1940, MRN [id number]  PCP:  Lavone Orn, MD  Cardiologist:  Dr. Martinique    Chief Complaint  Patient presents with  . Coronary Artery Disease      History of Present Illness: Scott Wilkins is a 80 y.o. male who is seen for follow up CAD.  He was admitted July 04, 2014 with  lateral ST elevation MI. He underwent emergent cath and received a drug-eluting stent Promus Premier to the proximal circumflex. He also had POBA of prox. diagonal 1. He also had severe disease in the distal LAD that was too small for PCI. His EF remained fairly normal 50-55%. He is diabetic.   On follow up today he continues to do well. He denies any chest pain, dyspnea, palpitations, dizziness. He does stay busy with house chores and yard work. Reports he started on Trulicity this past month. Does not really monitor his BP.     Past Medical History:  Diagnosis Date  . CAD (coronary artery disease)    a. 06/2014 Lateral STEMI/PCI: LM nl, LAD 90d, small, D1 95 (PTCA only), LCX 90p (3.5 x 16 Promus DES), RCA dominant, 46m RPDA 535m . CKD (chronic kidney disease), stage III ([place]  . Diabetes mellitus   . Hyperlipidemia   . Hypertension   . Rosacea   . Seizures ([facility]   h/o - none in years    Past Surgical History:  Procedure Laterality Date  . CRANIOTOMY  1948   left craniectomy after head trauma  . LEFT HEART CATHETERIZATION WITH CORONARY ANGIOGRAM N/A 07/04/2014   Procedure: LEFT HEART CATHETERIZATION WITH CORONARY ANGIOGRAM;  Surgeon: Wyonia Fontanella M JoMartiniqueMD;  [facility];  Service: Cardiovascular;  Laterality: N/A;  . PERCUTANEOUS CORONARY STENT INTERVENTION (PCI-S)  07/04/2014   Procedure: PERCUTANEOUS CORONARY STENT INTERVENTION (PCI-S);  Surgeon: Domnic Vantol M JoMartiniqueMD;  [facility];  Service: Cardiovascular;;  prox CFX  . RIB FRACTURE SURGERY  1976   ORIF after frcture due to trauma     Current  Outpatient Medications  Medication Sig Dispense Refill  . Accu-Chek Softclix Lancets lancets 1 each by Other route 2 (two) times daily. E11.9 200 each 0  . aspirin 81 MG chewable tablet Chew 1 tablet (81 mg total) by mouth daily.    . Marland Kitchentorvastatin (LIPITOR) 80 MG tablet TAKE 1 TABLET  DAILY AT 6 PM. 90 tablet 3  . Blood Glucose Calibration (ACCU-CHEK AVIVA) SOLN 1 each by Other route as needed (Use to calibrate glucometer). E11.9 1 each 0  . Blood Glucose Monitoring Suppl (ACCU-CHEK AVIVA PLUS) w/Device KIT 1 each by Does not apply route 2 (two) times daily. E11.9 1 kit 0  . carvedilol (COREG) 3.125 MG tablet TAKE 1 TABLET TWICE DAILY WITH MEALS 180 tablet 1  . Dulaglutide (TRULICITY) 0.0.94GBS/9.6GEOPN Inject 0.75 mg into the skin once a week. 6 mL 3  . glucose blood (ACCU-CHEK AVIVA PLUS) test strip 1 each by Other route 2 (two) times daily. E11.9 200 each 0  . insulin NPH-regular Human (70-30) 100 UNIT/ML injection 20 units with breakfast, and 5 units with the evening meal. 20 mL 11  . nitroGLYCERIN (NITROSTAT) 0.4 MG SL tablet Place 1 tablet (0.4 mg total) under the tongue every 5 (five) minutes x 3 doses as needed for chest pain. 25 tablet 11  . Simethicone (GAS-X PO) Take 1 tablet  by mouth as needed.     . tamsulosin (FLOMAX) 0.4 MG CAPS capsule Take 1 capsule (0.4 mg total) by mouth daily. 30 capsule 3   No current facility-administered medications for this visit.    Allergies:   Metformin and related, Other, and Ozempic (0.25 or 0.5 mg-dose) [semaglutide(0.25 or 0.22m-dos)]    Social History:  The patient  reports that he has never smoked. He has never used smokeless tobacco. He reports that he does not drink alcohol and does not use drugs.   Family History:  The patient's family history includes Heart disease in his brother, brother, father, and mother.    ROS:  As noted in HPI. All other systems are reviewed and are otherwise normal.   Wt Readings from Last 3 Encounters:   11/04/20 159 lb 9.6 oz (72.4 kg)  09/24/20 162 lb 3.2 oz (73.6 kg)  07/22/20 163 lb 6.4 oz (74.1 kg)     PHYSICAL EXAM: VS:  BP (!) 152/78 (BP Location: Left Arm, Patient Position: Sitting, Cuff Size: Normal)   Pulse 61   Ht 5' 4.5" (1.638 m)   Wt 159 lb 9.6 oz (72.4 kg)   BMI 26.97 kg/m  , BMI Body mass index is 26.97 kg/m. GENERAL:  Well appearing WM in NAD HEENT:  PERRL, EOMI, sclera are clear. Oropharynx is clear. NECK:  No jugular venous distention, carotid upstroke brisk and symmetric, no bruits, no thyromegaly or adenopathy LUNGS:  Clear to auscultation bilaterally CHEST:  Unremarkable HEART:  RRR,  PMI not displaced or sustained,S1 and S2 within normal limits, no S3, no S4: no clicks, no rubs, no murmurs ABD:  Soft, nontender. BS +, no masses or bruits. No hepatomegaly, no splenomegaly EXT:  2 + pulses throughout, no edema, no cyanosis no clubbing SKIN:  Warm and dry.  No rashes NEURO:  Alert and oriented x 3. Cranial nerves II through XII intact. PSYCH:  Cognitively intact    Recent Labs: 04/22/2020: ALT 27; BUN 26; Creatinine, Ser 1.41; Hemoglobin 14.3; Platelets 114; Potassium 3.7; Sodium 139 05/19/2020: TSH 2.17    Lipid Panel    Component Value Date/Time   CHOL 120 11/03/2016 1212   TRIG 106 11/03/2016 1212   HDL 47 11/03/2016 1212   CHOLHDL 2.6 11/03/2016 1212   CHOLHDL 2.9 07/07/2014 0506   VLDL 25 07/07/2014 0506   LDLCALC 52 11/03/2016 1212   LDLDIRECT 126.0 08/22/2008 1610    Labs dated 06/25/15: cholesterol 122, triglycerides 126, HDL 46, LDL 51. BUN 25, creatinine 1.56. CMET otherwise normal  Dated 10/18/16 A1c 7.8% Dated 04/27/17: cholesterol 138, triglycerides 178, HDL 52, LDL 51.  Dated 08/22/17: A1c 8.5%. Dated 05/04/18: cholesterol 138, triglycerides 112, HDL 50, LDL 52. Creatinine 1.77. otherwise CMET normal Dated 12/27/18: A1c 8.6% Dated 05/18/20: cholesterol 128, triglycerides 161, HDL 54, LDL 47, Hgb 14.3. ALT and TSH normal Dated 10/05/20:  creatinine. 1.49. A1c 8.8. potassium 4.2   Ecg today shows NSR with rate 61. Normal Ecg. I have personally reviewed and interpreted this study.    ASSESSMENT AND PLAN:  1.  CAD s/p  STEMI of lateral wall with placement of Promus Premier drug-eluting stent to the circumflex and POBA to diagonal 1 in January 2016. He has no significant angina. He is on aspirin, beta blocker, and statin.  2. Hyperlipidemia with target LDL less than 70. Excellent control on lipitor.   3. Essential hypertension- BP is not well controlled. Will increase lisinopril to 10 mg daily  4  Diabetes  mellitus on long term insulin. A1c 8.8%. Management per primary care. added Trulicity. Encouraged increased aerobic activity and weight loss.  5. CKD stage 3a. Stable.    I will follow up in one year.

## 2020-11-04 ENCOUNTER — Ambulatory Visit (INDEPENDENT_AMBULATORY_CARE_PROVIDER_SITE_OTHER): Payer: Medicare HMO | Admitting: Cardiology

## 2020-11-04 ENCOUNTER — Other Ambulatory Visit: Payer: Self-pay

## 2020-11-04 ENCOUNTER — Ambulatory Visit: Payer: Medicare HMO | Admitting: Neurology

## 2020-11-04 ENCOUNTER — Encounter: Payer: Self-pay | Admitting: Cardiology

## 2020-11-04 VITALS — BP 152/78 | HR 61 | Ht 64.5 in | Wt 159.6 lb

## 2020-11-04 DIAGNOSIS — I10 Essential (primary) hypertension: Secondary | ICD-10-CM | POA: Diagnosis not present

## 2020-11-04 DIAGNOSIS — E785 Hyperlipidemia, unspecified: Secondary | ICD-10-CM | POA: Diagnosis not present

## 2020-11-04 DIAGNOSIS — E119 Type 2 diabetes mellitus without complications: Secondary | ICD-10-CM | POA: Diagnosis not present

## 2020-11-04 DIAGNOSIS — I2510 Atherosclerotic heart disease of native coronary artery without angina pectoris: Secondary | ICD-10-CM

## 2020-11-04 MED ORDER — LISINOPRIL 10 MG PO TABS: 10.0000 mg | ORAL_TABLET | Freq: Every day | ORAL | 3 refills | Status: DC

## 2020-11-04 NOTE — Addendum Note (Signed)
Addended by: Neoma Laming on: 11/04/2020 11:19 AM   Modules accepted: Orders

## 2020-11-11 ENCOUNTER — Ambulatory Visit: Payer: Medicare HMO | Admitting: Endocrinology

## 2020-11-16 DIAGNOSIS — N401 Enlarged prostate with lower urinary tract symptoms: Secondary | ICD-10-CM | POA: Diagnosis not present

## 2020-11-16 DIAGNOSIS — Z794 Long term (current) use of insulin: Secondary | ICD-10-CM | POA: Diagnosis not present

## 2020-11-16 DIAGNOSIS — I129 Hypertensive chronic kidney disease with stage 1 through stage 4 chronic kidney disease, or unspecified chronic kidney disease: Secondary | ICD-10-CM | POA: Diagnosis not present

## 2020-11-16 DIAGNOSIS — E1122 Type 2 diabetes mellitus with diabetic chronic kidney disease: Secondary | ICD-10-CM | POA: Diagnosis not present

## 2020-11-16 DIAGNOSIS — M5441 Lumbago with sciatica, right side: Secondary | ICD-10-CM | POA: Diagnosis not present

## 2020-12-09 DIAGNOSIS — I2510 Atherosclerotic heart disease of native coronary artery without angina pectoris: Secondary | ICD-10-CM | POA: Diagnosis not present

## 2020-12-09 DIAGNOSIS — N183 Chronic kidney disease, stage 3 unspecified: Secondary | ICD-10-CM | POA: Diagnosis not present

## 2020-12-09 DIAGNOSIS — E7800 Pure hypercholesterolemia, unspecified: Secondary | ICD-10-CM | POA: Diagnosis not present

## 2020-12-09 DIAGNOSIS — M1812 Unilateral primary osteoarthritis of first carpometacarpal joint, left hand: Secondary | ICD-10-CM | POA: Diagnosis not present

## 2020-12-09 DIAGNOSIS — N1832 Chronic kidney disease, stage 3b: Secondary | ICD-10-CM | POA: Diagnosis not present

## 2020-12-09 DIAGNOSIS — I129 Hypertensive chronic kidney disease with stage 1 through stage 4 chronic kidney disease, or unspecified chronic kidney disease: Secondary | ICD-10-CM | POA: Diagnosis not present

## 2020-12-09 DIAGNOSIS — I252 Old myocardial infarction: Secondary | ICD-10-CM | POA: Diagnosis not present

## 2020-12-09 DIAGNOSIS — E1122 Type 2 diabetes mellitus with diabetic chronic kidney disease: Secondary | ICD-10-CM | POA: Diagnosis not present

## 2020-12-09 DIAGNOSIS — N401 Enlarged prostate with lower urinary tract symptoms: Secondary | ICD-10-CM | POA: Diagnosis not present

## 2020-12-18 ENCOUNTER — Other Ambulatory Visit: Payer: Self-pay | Admitting: Internal Medicine

## 2020-12-18 ENCOUNTER — Ambulatory Visit
Admission: RE | Admit: 2020-12-18 | Discharge: 2020-12-18 | Disposition: A | Discharge status: Home / Self Care | Payer: Medicare HMO | Source: Ambulatory Visit | Attending: Internal Medicine | Admitting: Internal Medicine

## 2020-12-18 DIAGNOSIS — M25561 Pain in right knee: Secondary | ICD-10-CM

## 2020-12-18 DIAGNOSIS — M25551 Pain in right hip: Secondary | ICD-10-CM

## 2020-12-18 DIAGNOSIS — M1711 Unilateral primary osteoarthritis, right knee: Secondary | ICD-10-CM | POA: Diagnosis not present

## 2020-12-18 DIAGNOSIS — R5383 Other fatigue: Secondary | ICD-10-CM | POA: Diagnosis not present

## 2020-12-18 DIAGNOSIS — M79604 Pain in right leg: Secondary | ICD-10-CM | POA: Diagnosis not present

## 2020-12-22 DIAGNOSIS — R3911 Hesitancy of micturition: Secondary | ICD-10-CM | POA: Diagnosis not present

## 2020-12-22 DIAGNOSIS — R300 Dysuria: Secondary | ICD-10-CM | POA: Diagnosis not present

## 2020-12-22 DIAGNOSIS — R102 Pelvic and perineal pain: Secondary | ICD-10-CM | POA: Diagnosis not present

## 2020-12-22 DIAGNOSIS — N401 Enlarged prostate with lower urinary tract symptoms: Secondary | ICD-10-CM | POA: Diagnosis not present

## 2020-12-22 DIAGNOSIS — R310 Gross hematuria: Secondary | ICD-10-CM | POA: Diagnosis not present

## 2020-12-24 DIAGNOSIS — M47817 Spondylosis without myelopathy or radiculopathy, lumbosacral region: Secondary | ICD-10-CM | POA: Diagnosis not present

## 2020-12-24 DIAGNOSIS — M545 Low back pain, unspecified: Secondary | ICD-10-CM | POA: Diagnosis not present

## 2020-12-24 DIAGNOSIS — M25551 Pain in right hip: Secondary | ICD-10-CM | POA: Diagnosis not present

## 2020-12-31 DIAGNOSIS — E7800 Pure hypercholesterolemia, unspecified: Secondary | ICD-10-CM | POA: Diagnosis not present

## 2020-12-31 DIAGNOSIS — I10 Essential (primary) hypertension: Secondary | ICD-10-CM | POA: Diagnosis not present

## 2020-12-31 DIAGNOSIS — E1122 Type 2 diabetes mellitus with diabetic chronic kidney disease: Secondary | ICD-10-CM | POA: Diagnosis not present

## 2020-12-31 DIAGNOSIS — N1832 Chronic kidney disease, stage 3b: Secondary | ICD-10-CM | POA: Diagnosis not present

## 2020-12-31 DIAGNOSIS — I129 Hypertensive chronic kidney disease with stage 1 through stage 4 chronic kidney disease, or unspecified chronic kidney disease: Secondary | ICD-10-CM | POA: Diagnosis not present

## 2020-12-31 DIAGNOSIS — I2510 Atherosclerotic heart disease of native coronary artery without angina pectoris: Secondary | ICD-10-CM | POA: Diagnosis not present

## 2020-12-31 DIAGNOSIS — I252 Old myocardial infarction: Secondary | ICD-10-CM | POA: Diagnosis not present

## 2020-12-31 DIAGNOSIS — N401 Enlarged prostate with lower urinary tract symptoms: Secondary | ICD-10-CM | POA: Diagnosis not present

## 2020-12-31 DIAGNOSIS — M1812 Unilateral primary osteoarthritis of first carpometacarpal joint, left hand: Secondary | ICD-10-CM | POA: Diagnosis not present

## 2021-01-07 ENCOUNTER — Telehealth: Payer: Self-pay | Admitting: Endocrinology

## 2021-01-07 DIAGNOSIS — M25551 Pain in right hip: Secondary | ICD-10-CM | POA: Diagnosis not present

## 2021-01-07 NOTE — Telephone Encounter (Signed)
Message sent thru MyChart 

## 2021-01-07 NOTE — Telephone Encounter (Signed)
[employer] has gone up on trulicty insulin to over 700 a for 90 days. Patient wants an insulin that is less expensive.

## 2021-02-01 ENCOUNTER — Telehealth: Payer: Self-pay | Admitting: Endocrinology

## 2021-02-01 NOTE — Telephone Encounter (Signed)
Pt called and stated that Dulaglutide (TRULICITY) 0.75 MG/0.5ML SOPN [[id number] is to much for him to pay and this is the last week before he runs out.

## 2021-02-03 DIAGNOSIS — R3912 Poor urinary stream: Secondary | ICD-10-CM | POA: Diagnosis not present

## 2021-02-03 DIAGNOSIS — N401 Enlarged prostate with lower urinary tract symptoms: Secondary | ICD-10-CM | POA: Diagnosis not present

## 2021-02-03 DIAGNOSIS — R3911 Hesitancy of micturition: Secondary | ICD-10-CM | POA: Diagnosis not present

## 2021-02-05 DIAGNOSIS — N183 Chronic kidney disease, stage 3 unspecified: Secondary | ICD-10-CM | POA: Diagnosis not present

## 2021-02-05 DIAGNOSIS — I10 Essential (primary) hypertension: Secondary | ICD-10-CM | POA: Diagnosis not present

## 2021-02-05 DIAGNOSIS — I129 Hypertensive chronic kidney disease with stage 1 through stage 4 chronic kidney disease, or unspecified chronic kidney disease: Secondary | ICD-10-CM | POA: Diagnosis not present

## 2021-02-05 DIAGNOSIS — E1122 Type 2 diabetes mellitus with diabetic chronic kidney disease: Secondary | ICD-10-CM | POA: Diagnosis not present

## 2021-02-05 DIAGNOSIS — I2510 Atherosclerotic heart disease of native coronary artery without angina pectoris: Secondary | ICD-10-CM | POA: Diagnosis not present

## 2021-02-05 DIAGNOSIS — E7800 Pure hypercholesterolemia, unspecified: Secondary | ICD-10-CM | POA: Diagnosis not present

## 2021-02-05 DIAGNOSIS — N401 Enlarged prostate with lower urinary tract symptoms: Secondary | ICD-10-CM | POA: Diagnosis not present

## 2021-02-10 ENCOUNTER — Other Ambulatory Visit: Payer: Self-pay | Admitting: Cardiology

## 2021-02-17 ENCOUNTER — Telehealth: Payer: Self-pay | Admitting: Endocrinology

## 2021-02-17 NOTE — Telephone Encounter (Signed)
please contact patient: Please schedule f/u appt Then i'll sign forms.

## 2021-03-01 NOTE — Telephone Encounter (Signed)
Patient is scheduled for appointment on 03/05/21

## 2021-03-01 NOTE — Telephone Encounter (Signed)
LMTCB to schedule/reschedule appointment

## 2021-03-05 ENCOUNTER — Ambulatory Visit (INDEPENDENT_AMBULATORY_CARE_PROVIDER_SITE_OTHER): Payer: Medicare HMO | Admitting: Endocrinology

## 2021-03-05 ENCOUNTER — Encounter: Payer: Self-pay | Admitting: Endocrinology

## 2021-03-05 ENCOUNTER — Other Ambulatory Visit: Payer: Self-pay

## 2021-03-05 VITALS — BP 130/64 | HR 75 | Ht 64.5 in | Wt 158.8 lb

## 2021-03-05 DIAGNOSIS — E119 Type 2 diabetes mellitus without complications: Secondary | ICD-10-CM | POA: Diagnosis not present

## 2021-03-05 LAB — POCT GLYCOSYLATED HEMOGLOBIN (HGB A1C): Hemoglobin A1C: 8.4 % — AB (ref 4.0–5.6)

## 2021-03-05 NOTE — Progress Notes (Signed)
Subjective:    Patient ID: Scott Wilkins, male    DOB: Feb 17, 1941, 80 y.o.   MRN: [id number]  HPI Pt returns for f/u of diabetes mellitus: DM type: Insulin-requiring type 2 Dx'ed: 7001 Complications: PN, CRI, CN4 MN, and CAD.   Therapy: insulin since 2016.   DKA: never Severe hypoglycemia: never Pancreatitis: never Pancreatic imaging: normal on 2001 CT SDOH: therapy is limited by pt's request for least expensive meds Other: he declines multiple daily injections; Ozempic was d/c'ed, due to headache.   Interval history: Pt is overdue for f/u.  He stopped Trulicity, due to very high copay.  He takes 25 units qam and 10 units QPM.  He also takes PRN glimepiride.  Meter is downloaded today, and the printout is scanned into the record.  Glucose varies from 50-400.  It is in general higher as the day goes on, but not necessarily so.  He wants to re-try Trulicity, to see if he can afford it.   Past Medical History:  Diagnosis Date   CAD (coronary artery disease)    a. 06/2014 Lateral STEMI/PCI: LM nl, LAD 90d, small, D1 95 (PTCA only), LCX 90p (3.5 x 16 Promus DES), RCA dominant, 78m RPDA 545m  CKD (chronic kidney disease), stage III (HCC)    Diabetes mellitus    Hyperlipidemia    Hypertension    Rosacea    [place]   h/[place] - none in years    Past Surgical History:  Procedure Laterality Date   CRANIOTOMY  1948   left craniectomy after head trauma   LEFT HEART CATHETERIZATION WITH CORONARY ANGIOGRAM N/A 07/04/2014   Procedure: LEFT HEART CATHETERIZATION WITH CORONARY ANGIOGRAM;  Surgeon: Peter M JoMartiniqueMD;  [facility];  Service: Cardiovascular;  Laterality: N/A;   PERCUTANEOUS CORONARY STENT INTERVENTION (PCI-S)  07/04/2014   Procedure: PERCUTANEOUS CORONARY STENT INTERVENTION (PCI-S);  Surgeon: Peter M JoMartiniqueMD;  [facility];  Service: Cardiovascular;;  prox CFX   RIB FRACTURE SURGERY  1976   ORIF after frcture due to trauma    Social History    Socioeconomic History   Marital status: Married    Spouse name: Not on file   Number of children: 3   Years of education: 8   Highest education level: Not on file  Occupational History   Occupation: truck driver    Comment: retired  Tobacco Use   Smoking status: Never   Smokeless tobacco: Never  VaScientific laboratory technicianse: Never used  Substance and Sexual Activity   Alcohol use: No   Drug use: No   Sexual activity: Never  Other Topics Concern   Not on file  Social History Narrative   Finished 8th grade. Married '64. 3 dtrs. 5 Grandchildren. Work - retired trAdministratorLives in [place] wife. Left handed   Drinks caffeine   One story home   Social Determinants of Health   Financial Resource Strain: Not on file  Food Insecurity: Not on file  Transportation Needs: Not on file  Physical Activity: Not on file  Stress: Not on file  Social Connections: Not on file  Intimate Partner Violence: Not on file    Current Outpatient Medications on File Prior to Visit  Medication Sig Dispense Refill   Accu-Chek Softclix Lancets lancets 1 each by Other route 2 (two) times daily. E11.9 200 each 0   aspirin 81 MG chewable tablet Chew 1 tablet (81 mg total) by mouth  daily.     atorvastatin (LIPITOR) 80 MG tablet TAKE 1 TABLET  DAILY AT 6 PM. 90 tablet 3   Blood Glucose Calibration (ACCU-CHEK AVIVA) SOLN 1 each by Other route as needed (Use to calibrate glucometer). E11.9 1 each 0   Blood Glucose Monitoring Suppl (ACCU-CHEK AVIVA PLUS) w/Device KIT 1 each by Does not apply route 2 (two) times daily. E11.9 1 kit 0   carvedilol (COREG) 3.125 MG tablet TAKE 1 TABLET TWICE DAILY WITH MEALS. PLEASE SCHEDULE AN APPOINTMENT FOR FUTURE REFILLS. 180 tablet 1   Dulaglutide (TRULICITY) 0.86 PY/1.9JK SOPN Inject 0.75 mg into the skin once a week. 6 mL 3   glucose blood (ACCU-CHEK AVIVA PLUS) test strip 1 each by Other route 2 (two) times daily. E11.9 200 each 0   insulin NPH-regular Human  (70-30) 100 UNIT/ML injection 20 units with breakfast, and 5 units with the evening meal. 20 mL 11   lisinopril (ZESTRIL) 10 MG tablet Take 1 tablet (10 mg total) by mouth daily. 90 tablet 3   nitroGLYCERIN (NITROSTAT) 0.4 MG SL tablet Place 1 tablet (0.4 mg total) under the tongue every 5 (five) minutes x 3 doses as needed for chest pain. 25 tablet 11   Simethicone (GAS-X PO) Take 1 tablet by mouth as needed.      tamsulosin (FLOMAX) 0.4 MG CAPS capsule Take 1 capsule (0.4 mg total) by mouth daily. 30 capsule 3   No current facility-administered medications on file prior to visit.    Allergies  Allergen Reactions   Metformin And Related     Abdominal pain, diarrhea   Other Other (See Comments)   Ozempic (0.25 Or 0.5 Mg-Dose) [Semaglutide(0.25 Or 0.33m-Dos)]     Nausea, headache, vision    Family History  Problem Relation Age of Onset   Heart disease Father        CAD - died @ 748of MI   Heart disease Mother        CAD   Heart disease Brother        CAD - died @ 748of MI   Heart disease Brother        CAD   Cancer Neg Hx    Diabetes Neg Hx    COPD Neg Hx     BP 130/64 (BP Location: Right Arm, Patient Position: Sitting, Cuff Size: Normal)   Pulse 75   Ht 5' 4.5" (1.638 m)   Wt 158 lb 12.8 oz (72 kg)   SpO2 95%   BMI 26.84 kg/m    Review of Systems     Objective:   Physical Exam Pulses: dorsalis pedis intact bilat.   MSK: no deformity of the feet CV: no leg edema Skin:  no ulcer on the feet.  normal color and temp on the feet. Neuro: sensation is intact to touch on the feet.   A1c=8.4%    Assessment & Plan:  Insulin-requiring type 2 DM: uncontrolled.  Hypoglycemia, due to insulin: this limits aggressiveness of glycemic control  Patient Instructions  check your blood sugar twice a day.  vary the time of day when you check, between before the 3 meals, and at bedtime.  also check if you have symptoms of your blood sugar being too high or too low.  please keep a  record of the readings and bring it to your next appointment here (or you can bring the meter itself).  You can write it on any piece of paper.  please call [place]  sooner if your blood sugar goes below 70, or if you have a lot of readings over 200.   Please add Trulicity, and reduce the insulin to 20 units with breakfast, and 5 units with the evening meal.   You should also stop taking the glimepiride.   Please come back for a follow-up appointment in 2 months.

## 2021-03-05 NOTE — Patient Instructions (Addendum)
check your blood sugar twice a day.  vary the time of day when you check, between before the 3 meals, and at bedtime.  also check if you have symptoms of your blood sugar being too high or too low.  please keep a record of the readings and bring it to your next appointment here (or you can bring the meter itself).  You can write it on any piece of paper.  please call us sooner if your blood sugar goes below 70, or if you have a lot of readings over 200.   Please add Trulicity, and reduce the insulin to 20 units with breakfast, and 5 units with the evening meal.   You should also stop taking the glimepiride.   Please come back for a follow-up appointment in 2 months.

## 2021-03-18 NOTE — Telephone Encounter (Addendum)
Message sent thru MyChart and application from [employer] has been placed up front for pt to look over

## 2021-03-18 NOTE — Telephone Encounter (Signed)
Pt calling in regards to the application, there were mistakes on the application that the pt had to correct. Do we still have the application for them to come and fix and refax? Please call the patient [phone]

## 2021-03-29 NOTE — Telephone Encounter (Signed)
Papers has been dated and refaxed

## 2021-03-29 NOTE — Telephone Encounter (Signed)
Pt called, per [facility] Dr Everardo All has to date page 5 and it has to be refaxed. Pt contact [phone]

## 2021-03-30 DIAGNOSIS — M1812 Unilateral primary osteoarthritis of first carpometacarpal joint, left hand: Secondary | ICD-10-CM | POA: Diagnosis not present

## 2021-03-30 DIAGNOSIS — E7800 Pure hypercholesterolemia, unspecified: Secondary | ICD-10-CM | POA: Diagnosis not present

## 2021-03-30 DIAGNOSIS — I2510 Atherosclerotic heart disease of native coronary artery without angina pectoris: Secondary | ICD-10-CM | POA: Diagnosis not present

## 2021-03-30 DIAGNOSIS — N1832 Chronic kidney disease, stage 3b: Secondary | ICD-10-CM | POA: Diagnosis not present

## 2021-03-30 DIAGNOSIS — N401 Enlarged prostate with lower urinary tract symptoms: Secondary | ICD-10-CM | POA: Diagnosis not present

## 2021-03-30 DIAGNOSIS — I10 Essential (primary) hypertension: Secondary | ICD-10-CM | POA: Diagnosis not present

## 2021-03-30 DIAGNOSIS — I129 Hypertensive chronic kidney disease with stage 1 through stage 4 chronic kidney disease, or unspecified chronic kidney disease: Secondary | ICD-10-CM | POA: Diagnosis not present

## 2021-03-30 DIAGNOSIS — E1122 Type 2 diabetes mellitus with diabetic chronic kidney disease: Secondary | ICD-10-CM | POA: Diagnosis not present

## 2021-03-31 ENCOUNTER — Encounter: Payer: Self-pay | Admitting: Endocrinology

## 2021-03-31 DIAGNOSIS — H52203 Unspecified astigmatism, bilateral: Secondary | ICD-10-CM | POA: Diagnosis not present

## 2021-03-31 DIAGNOSIS — E119 Type 2 diabetes mellitus without complications: Secondary | ICD-10-CM | POA: Diagnosis not present

## 2021-03-31 DIAGNOSIS — H5203 Hypermetropia, bilateral: Secondary | ICD-10-CM | POA: Diagnosis not present

## 2021-03-31 DIAGNOSIS — H2513 Age-related nuclear cataract, bilateral: Secondary | ICD-10-CM | POA: Diagnosis not present

## 2021-03-31 LAB — HM DIABETES EYE EXAM

## 2021-04-07 ENCOUNTER — Other Ambulatory Visit: Payer: Self-pay | Admitting: Cardiology

## 2021-04-12 ENCOUNTER — Other Ambulatory Visit: Payer: Self-pay | Admitting: Endocrinology

## 2021-04-12 DIAGNOSIS — K921 Melena: Secondary | ICD-10-CM | POA: Diagnosis not present

## 2021-04-12 DIAGNOSIS — E119 Type 2 diabetes mellitus without complications: Secondary | ICD-10-CM

## 2021-04-12 DIAGNOSIS — M5459 Other low back pain: Secondary | ICD-10-CM | POA: Diagnosis not present

## 2021-04-12 DIAGNOSIS — G44209 Tension-type headache, unspecified, not intractable: Secondary | ICD-10-CM | POA: Diagnosis not present

## 2021-04-12 DIAGNOSIS — R413 Other amnesia: Secondary | ICD-10-CM | POA: Diagnosis not present

## 2021-04-13 ENCOUNTER — Telehealth: Payer: Self-pay

## 2021-04-13 NOTE — Telephone Encounter (Signed)
Pt has been notified that he has been approved thru [employer] to receive medications thru 05/12/2021 as long as he meets the programs eligibility requirements.

## 2021-04-27 ENCOUNTER — Ambulatory Visit (INDEPENDENT_AMBULATORY_CARE_PROVIDER_SITE_OTHER): Payer: Medicare HMO | Admitting: Internal Medicine

## 2021-04-27 ENCOUNTER — Other Ambulatory Visit: Payer: Self-pay

## 2021-04-27 ENCOUNTER — Encounter: Payer: Self-pay | Admitting: Internal Medicine

## 2021-04-27 VITALS — BP 122/70 | HR 70 | Resp 18 | Ht 64.5 in | Wt 157.6 lb

## 2021-04-27 DIAGNOSIS — M1A00X0 Idiopathic chronic gout, unspecified site, without tophus (tophi): Secondary | ICD-10-CM

## 2021-04-27 DIAGNOSIS — E1169 Type 2 diabetes mellitus with other specified complication: Secondary | ICD-10-CM

## 2021-04-27 DIAGNOSIS — Z1211 Encounter for screening for malignant neoplasm of colon: Secondary | ICD-10-CM | POA: Diagnosis not present

## 2021-04-27 DIAGNOSIS — I10 Essential (primary) hypertension: Secondary | ICD-10-CM | POA: Diagnosis not present

## 2021-04-27 DIAGNOSIS — N401 Enlarged prostate with lower urinary tract symptoms: Secondary | ICD-10-CM | POA: Diagnosis not present

## 2021-04-27 DIAGNOSIS — R413 Other amnesia: Secondary | ICD-10-CM | POA: Diagnosis not present

## 2021-04-27 DIAGNOSIS — R5383 Other fatigue: Secondary | ICD-10-CM

## 2021-04-27 DIAGNOSIS — N183 Chronic kidney disease, stage 3 unspecified: Secondary | ICD-10-CM

## 2021-04-27 DIAGNOSIS — R400 Somnolence: Secondary | ICD-10-CM

## 2021-04-27 DIAGNOSIS — E119 Type 2 diabetes mellitus without complications: Secondary | ICD-10-CM

## 2021-04-27 DIAGNOSIS — E785 Hyperlipidemia, unspecified: Secondary | ICD-10-CM

## 2021-04-27 DIAGNOSIS — R350 Frequency of micturition: Secondary | ICD-10-CM

## 2021-04-27 LAB — CBC
HCT: 40.5 % (ref 39.0–52.0)
Hemoglobin: 13.5 g/dL (ref 13.0–17.0)
MCHC: 33.3 g/dL (ref 30.0–36.0)
MCV: 91.3 fl (ref 78.0–100.0)
Platelets: 89 10*3/uL — ABNORMAL LOW (ref 150.0–400.0)
RBC: 4.44 Mil/uL (ref 4.22–5.81)
RDW: 12.7 % (ref 11.5–15.5)
WBC: 3.7 10*3/uL — ABNORMAL LOW (ref 4.0–10.5)

## 2021-04-27 LAB — COMPREHENSIVE METABOLIC PANEL
ALT: 16 U/L (ref 0–53)
AST: 16 U/L (ref 0–37)
Albumin: 4.2 g/dL (ref 3.5–5.2)
Alkaline Phosphatase: 94 U/L (ref 39–117)
BUN: 25 mg/dL — ABNORMAL HIGH (ref 6–23)
CO2: 29 mEq/L (ref 19–32)
Calcium: 9 mg/dL (ref 8.4–10.5)
Chloride: 106 mEq/L (ref 96–112)
Creatinine, Ser: 1.6 mg/dL — ABNORMAL HIGH (ref 0.40–1.50)
GFR: 41.14 mL/min — ABNORMAL LOW (ref >60.00)
Glucose, Bld: 271 mg/dL — ABNORMAL HIGH (ref 70–99)
Potassium: 4.2 mEq/L (ref 3.5–5.1)
Sodium: 141 mEq/L (ref 135–145)
Total Bilirubin: 0.9 mg/dL (ref 0.2–1.2)
Total Protein: 6.5 g/dL (ref 6.0–8.3)

## 2021-04-27 LAB — BRAIN NATRIURETIC PEPTIDE: Pro B Natriuretic peptide (BNP): 25 pg/mL (ref 0.0–100.0)

## 2021-04-27 LAB — LIPID PANEL
Cholesterol: 127 mg/dL (ref 0–200)
HDL: 59.7 mg/dL (ref >39.00)
LDL Cholesterol: 55 mg/dL (ref 0–99)
NonHDL: 67.28
Total CHOL/HDL Ratio: 2
Triglycerides: 63 mg/dL (ref 0.0–149.0)
VLDL: 12.6 mg/dL (ref 0.0–40.0)

## 2021-04-27 LAB — VITAMIN D 25 HYDROXY (VIT D DEFICIENCY, FRACTURES): VITD: 48.01 ng/mL (ref 30.00–100.00)

## 2021-04-27 LAB — VITAMIN B12: Vitamin B-12: 526 pg/mL (ref 211–911)

## 2021-04-27 LAB — T4, FREE: Free T4: 0.76 ng/dL (ref 0.60–1.60)

## 2021-04-27 LAB — TSH: TSH: 1.64 u[IU]/mL (ref 0.35–5.50)

## 2021-04-27 MED ORDER — DONEPEZIL HCL 5 MG PO TABS: 5.0000 mg | ORAL_TABLET | Freq: Every day | ORAL | 3 refills | Status: DC

## 2021-04-27 NOTE — Progress Notes (Signed)
   Subjective:   Patient ID: Scott Wilkins, male    DOB: 08/13/1940, 80 y.o.   MRN: [id number]  HPI The patient is a 80 YO man coming in new for ongoing care.   PMH, [facility], social history reviewed and updated  Review of Systems  Constitutional: Negative.   HENT: Negative.    Eyes: Negative.   Respiratory:  Negative for cough, chest tightness and shortness of breath.   Cardiovascular:  Negative for chest pain, palpitations and leg swelling.  Gastrointestinal:  Negative for abdominal distention, abdominal pain, constipation, diarrhea, nausea and vomiting.  Musculoskeletal:  Positive for arthralgias, back pain and myalgias.  Skin: Negative.   Neurological: Negative.        Memory changes  Psychiatric/Behavioral: Negative.     Objective:  Physical Exam Constitutional:      Appearance: He is well-developed.  HENT:     Head: Normocephalic and atraumatic.  Cardiovascular:     Rate and Rhythm: Normal rate and regular rhythm.  Pulmonary:     Effort: Pulmonary effort is normal. No respiratory distress.     Breath sounds: Normal breath sounds. No wheezing or rales.  Abdominal:     General: Bowel sounds are normal. There is no distension.     Palpations: Abdomen is soft.     Tenderness: There is no abdominal tenderness. There is no rebound.  Musculoskeletal:        General: Tenderness present.     Cervical back: Normal range of motion.  Skin:    General: Skin is warm and dry.  Neurological:     Mental Status: He is alert and oriented to person, place, and time.     Coordination: Coordination normal.    Vitals:   04/27/21 1308  BP: 122/70  Pulse: 70  Resp: 18  SpO2: 95%  Weight: 157 lb 9.6 oz (71.5 kg)  Height: 5' 4.5" (1.638 m)    This visit occurred during the SARS-CoV-2 public health emergency.  Safety protocols were in place, including screening questions prior to the visit, additional usage of staff PPE, and extensive cleaning of exam room while observing appropriate  contact time as indicated for disinfecting solutions.   Assessment & Plan:

## 2021-04-27 NOTE — Patient Instructions (Addendum)
We will start the aricept for the memory to start taking 1 pill daily.   We will check the labs today. If they are normal we will get a sleep study.  We will get the records from Dr. Valentina Lucks and get you in for a colonoscopy.

## 2021-05-01 NOTE — Assessment & Plan Note (Signed)
Taking flomax 0.4 mg daily.  

## 2021-05-01 NOTE — Assessment & Plan Note (Signed)
Checking lipid panel and adjust lipitor 80 mg daily.  

## 2021-05-01 NOTE — Assessment & Plan Note (Signed)
Checking CBC and CMP and adjust coreg 3.125 mg BID and lisinopril 10 mg daily as needed.

## 2021-05-01 NOTE — Assessment & Plan Note (Signed)
No flare and not on controlling medication.

## 2021-05-01 NOTE — Assessment & Plan Note (Signed)
Seeing endo for HgA1c and management and taking trulicity and 70/30 and on ACE-I and statin.

## 2021-05-01 NOTE — Assessment & Plan Note (Signed)
They do wish to start aricept 5 mg daily as they are seeing some changes. Rx done today and follow up 3-6 months.

## 2021-05-01 NOTE — Assessment & Plan Note (Signed)
Checking thyroid for fatigue as well as BNP.

## 2021-05-11 ENCOUNTER — Telehealth: Payer: Self-pay

## 2021-05-11 NOTE — Telephone Encounter (Signed)
LVM for pt to let him know that we have received his Novolin 70/30 here in the office from [employer] and is ready to pick up.

## 2021-05-14 ENCOUNTER — Ambulatory Visit: Payer: Medicare HMO | Admitting: Endocrinology

## 2021-06-16 ENCOUNTER — Ambulatory Visit: Payer: Medicare HMO | Admitting: Endocrinology

## 2021-07-01 ENCOUNTER — Telehealth: Payer: Self-pay | Admitting: Internal Medicine

## 2021-07-01 MED ORDER — TRUE METRIX METER W/DEVICE KIT: 1.0000 | PACK | Freq: Two times a day (BID) | 0 refills | Status: AC

## 2021-07-01 MED ORDER — TRUE METRIX PRO BLOOD GLUCOSE VI STRP: ORAL_STRIP | 12 refills | Status: AC

## 2021-07-01 NOTE — Telephone Encounter (Signed)
New meter has been sent to the pt's pharmacy.    Called pt and ldvm letting him know that he should contact his cardiologist in regards to his flomax refill.

## 2021-07-01 NOTE — Telephone Encounter (Signed)
1.Medication Requested: tamsulosin (FLOMAX) 0.4 MG CAPS capsule  [employer] meter & supplies  2. Pharmacy (Name, Street, [facility]): [facility] - [place], [place] - [address]  Phone:  [phone] Fax:  [phone]   3. On Med List: yes   4. Last Visit with PCP: 11.15.22  5. Next visit date with PCP: n/a   Agent: Please be advised that RX refills may take up to 3 business days. We ask that you follow-up with your pharmacy.

## 2021-07-02 NOTE — Telephone Encounter (Signed)
We do prescribe his flomax as he just established care. Please fill for #90 3 refills.

## 2021-07-05 ENCOUNTER — Telehealth: Payer: Self-pay | Admitting: Endocrinology

## 2021-07-05 NOTE — Telephone Encounter (Signed)
Letter from [employer] asking for correction placed into Providers box along with copy of SS Benefit information.

## 2021-07-06 ENCOUNTER — Telehealth: Payer: Self-pay | Admitting: Endocrinology

## 2021-07-06 NOTE — Telephone Encounter (Signed)
1.  Please schedule f/u appt 2.  Then i'll sign form pending that appt.

## 2021-07-09 ENCOUNTER — Telehealth: Payer: Self-pay | Admitting: Endocrinology

## 2021-07-09 NOTE — Telephone Encounter (Signed)
please contact patient: If there is a gap in insurance, he should buy 70/30 vials are [facility]

## 2021-07-12 NOTE — Telephone Encounter (Signed)
Message sent thru MyChart 

## 2021-07-21 NOTE — Telephone Encounter (Signed)
LMTCB to reschedule cancelled appointments

## 2021-07-27 NOTE — Telephone Encounter (Signed)
LMTCB x 2 to reschedule cancelled appointments

## 2021-07-27 NOTE — Telephone Encounter (Signed)
Patient is scheduled for appointment on 07/29/21

## 2021-07-29 ENCOUNTER — Encounter: Payer: Self-pay | Admitting: Endocrinology

## 2021-07-29 ENCOUNTER — Telehealth: Payer: Self-pay | Admitting: Endocrinology

## 2021-07-29 ENCOUNTER — Ambulatory Visit: Payer: Medicare HMO | Admitting: Endocrinology

## 2021-07-29 ENCOUNTER — Other Ambulatory Visit: Payer: Self-pay

## 2021-07-29 VITALS — BP 116/70 | HR 73 | Ht 64.5 in | Wt 157.0 lb

## 2021-07-29 DIAGNOSIS — E119 Type 2 diabetes mellitus without complications: Secondary | ICD-10-CM | POA: Diagnosis not present

## 2021-07-29 LAB — POCT GLYCOSYLATED HEMOGLOBIN (HGB A1C): Hemoglobin A1C: 8.4 % — AB (ref 4.0–5.6)

## 2021-07-29 MED ORDER — TRULICITY 1.5 MG/0.5ML ~~LOC~~ SOAJ: 1.5000 mg | SUBCUTANEOUS | 3 refills | Status: DC

## 2021-07-29 NOTE — Telephone Encounter (Signed)
Pt came back to the office stating that his spouse was not able to leave a message on the option that was preferred.  Pt is needing his Trulicity to go to [facility] Pharm: [id number] [phone] (P) [phone] (fax) and he is also needing his Novalin 70/30 to come from [facility] (fax) [phone] [facility] [phone] they are wanting the one the was sent in to [facility] cancelled.  If there should be any question pts spouse would like to have [place] [phone].  Printed/handwritten information was placed in the providers tray for review.

## 2021-07-29 NOTE — Progress Notes (Signed)
Subjective:    Patient ID: Scott Wilkins, male    DOB: 1940-12-04, 81 y.o.   MRN: [id number]  HPI Pt returns for f/u of diabetes mellitus: DM type: Insulin-requiring type 2 Dx'ed: 4158 Complications: PN, CRI, CN4 MN, and CAD.   Therapy: insulin since 2016.   DKA: never Severe hypoglycemia: never Pancreatitis: never Pancreatic imaging: normal on 2001 CT SDOH: none Other: he declines multiple daily injections; Ozempic was d/c'ed, due to headache.   Interval history: He brings his meter with his cbg's which I have reviewed today.  Glucose varies from 175-236.  It is in general higher as the day goes on, but not necessarily so.  He says Trulicity has  $0 copay. Past Medical History:  Diagnosis Date   CAD (coronary artery disease)    a. 06/2014 Lateral STEMI/PCI: LM nl, LAD 90d, small, D1 95 (PTCA only), LCX 90p (3.5 x 16 Promus DES), RCA dominant, 5m RPDA 575m  CKD (chronic kidney disease), stage III (HCC)    Diabetes mellitus    Hyperlipidemia    Hypertension    Rosacea    Seizures ([place]   h/o - none in years    Past Surgical History:  Procedure Laterality Date   CRANIOTOMY  1948   left craniectomy after head trauma   LEFT HEART CATHETERIZATION WITH CORONARY ANGIOGRAM N/A 07/04/2014   Procedure: LEFT HEART CATHETERIZATION WITH CORONARY ANGIOGRAM;  Surgeon: Peter M JoMartiniqueMD;  [facility];  Service: Cardiovascular;  Laterality: N/A;   PERCUTANEOUS CORONARY STENT INTERVENTION (PCI-S)  07/04/2014   Procedure: PERCUTANEOUS CORONARY STENT INTERVENTION (PCI-S);  Surgeon: Peter M JoMartiniqueMD;  [facility];  Service: Cardiovascular;;  prox CFX   RIB FRACTURE SURGERY  1976   ORIF after frcture due to trauma    Social History   Socioeconomic History   Marital status: Married    Spouse name: Not on file   Number of children: 3   Years of education: 8   Highest education level: Not on file  Occupational History   Occupation: truck driver    Comment:  retired  Tobacco Use   Smoking status: Never   Smokeless tobacco: Never  VaScientific laboratory technicianse: Never used  Substance and Sexual Activity   Alcohol use: No   Drug use: No   Sexual activity: Never  Other Topics Concern   Not on file  Social History Narrative   Finished 8th grade. Married '64. 3 dtrs. 5 Grandchildren. 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 Left handed   Drinks caffeine   One story home   Social Determinants of Health   Financial Resource Strain: Not on file  Food Insecurity: Not on file  Transportation Needs: Not on file  Physical Activity: Not on file  Stress: Not on file  Social Connections: Not on file  Intimate Partner Violence: Not on file    Current Outpatient Medications on File Prior to Visit  Medication Sig Dispense Refill   Accu-Chek Softclix Lancets lancets 1 each by Other route 2 (two) times daily. E11.9 200 each 0   aspirin 81 MG chewable tablet Chew 1 tablet (81 mg total) by mouth daily.     atorvastatin (LIPITOR) 80 MG tablet TAKE 1 TABLET  DAILY AT 6 PM. 90 tablet 3   Blood Glucose Calibration (ACCU-CHEK AVIVA) SOLN 1 each by Other route as needed (Use to calibrate glucometer). E11.9 1 each 0   Blood  Glucose Monitoring Suppl (TRUE METRIX METER) w/Device KIT 1 each by Does not apply route 2 (two) times daily. 1 kit 0   carvedilol (COREG) 3.125 MG tablet TAKE 1 TABLET TWICE DAILY WITH MEALS. PLEASE SCHEDULE AN APPOINTMENT FOR FUTURE REFILLS. 180 tablet 1   donepezil (ARICEPT) 5 MG tablet Take 1 tablet (5 mg total) by mouth at bedtime. 90 tablet 3   glucose blood (TRUE METRIX PRO BLOOD GLUCOSE) test strip Use as instructed 100 each 12   insulin NPH-regular Human (70-30) 100 UNIT/ML injection 20 units with breakfast, and 5 units with the evening meal. 20 mL 11   lisinopril (ZESTRIL) 10 MG tablet Take 1 tablet (10 mg total) by mouth daily. 90 tablet 3   nitroGLYCERIN (NITROSTAT) 0.4 MG SL tablet Place 1 tablet (0.4 mg total)  under the tongue every 5 (five) minutes x 3 doses as needed for chest pain. 25 tablet 11   Simethicone (GAS-X PO) Take 1 tablet by mouth as needed.      tamsulosin (FLOMAX) 0.4 MG CAPS capsule Take 1 capsule (0.4 mg total) by mouth daily. 30 capsule 3   No current facility-administered medications on file prior to visit.    Allergies  Allergen Reactions   Metformin And Related     Abdominal pain, diarrhea   Other Other (See Comments)   Ozempic (0.25 Or 0.5 Mg-Dose) [Semaglutide(0.25 Or 0.42m-Dos)]     Nausea, headache, vision    Family History  Problem Relation Age of Onset   Heart disease Father        CAD - died @ 759of MI   Heart disease Mother        CAD   Heart disease Brother        CAD - died @ 750of MI   Heart disease Brother        CAD   Cancer Neg Hx    Diabetes Neg Hx    COPD Neg Hx     BP 116/70 (BP Location: Left Arm, Patient Position: Sitting, Cuff Size: Normal)    Pulse 73    Ht 5' 4.5" (1.638 m)    Wt 157 lb (71.2 kg)    SpO2 95%    BMI 26.53 kg/m    Review of Systems Denies N/V/HB/bloating    Objective:   Physical Exam    Lab Results  Component Value Date   HGBA1C 8.4 (A) 07/29/2021      Assessment & Plan:  Insulin-requiring type 2 DM: uncontrolled  Patient Instructions  check your blood sugar twice a day.  vary the time of day when you check, between before the 3 meals, and at bedtime.  also check if you have symptoms of your blood sugar being too high or too low.  please keep a record of the readings and bring it to your next appointment here (or you can bring the meter itself).  You can write it on any piece of paper.  please call u[place]ooner if your blood sugar goes below 70, or if you have a lot of readings over 200.   I have sent a prescription to your pharmacy, to double the Trulicity, and: please continue the same insulin.   Please come back for a follow-up appointment in 2 months.

## 2021-07-29 NOTE — Patient Instructions (Addendum)
check your blood sugar twice a day.  vary the time of day when you check, between before the 3 meals, and at bedtime.  also check if you have symptoms of your blood sugar being too high or too low.  please keep a record of the readings and bring it to your next appointment here (or you can bring the meter itself).  You can write it on any piece of paper.  please call us sooner if your blood sugar goes below 70, or if you have a lot of readings over 200.   I have sent a prescription to your pharmacy, to double the Trulicity, and: please continue the same insulin.   Please come back for a follow-up appointment in 2 months.

## 2021-08-06 DIAGNOSIS — R3911 Hesitancy of micturition: Secondary | ICD-10-CM | POA: Diagnosis not present

## 2021-08-06 DIAGNOSIS — N5201 Erectile dysfunction due to arterial insufficiency: Secondary | ICD-10-CM | POA: Diagnosis not present

## 2021-08-06 DIAGNOSIS — N35013 Post-traumatic anterior urethral stricture: Secondary | ICD-10-CM | POA: Diagnosis not present

## 2021-08-06 DIAGNOSIS — R3912 Poor urinary stream: Secondary | ICD-10-CM | POA: Diagnosis not present

## 2021-08-06 DIAGNOSIS — N401 Enlarged prostate with lower urinary tract symptoms: Secondary | ICD-10-CM | POA: Diagnosis not present

## 2021-08-30 ENCOUNTER — Other Ambulatory Visit: Payer: Self-pay | Admitting: Cardiology

## 2021-08-30 DIAGNOSIS — R1032 Left lower quadrant pain: Secondary | ICD-10-CM | POA: Diagnosis not present

## 2021-09-01 ENCOUNTER — Other Ambulatory Visit: Payer: Medicare HMO

## 2021-09-02 ENCOUNTER — Encounter: Payer: Self-pay | Admitting: Internal Medicine

## 2021-09-02 ENCOUNTER — Ambulatory Visit (INDEPENDENT_AMBULATORY_CARE_PROVIDER_SITE_OTHER): Payer: Medicare HMO

## 2021-09-02 ENCOUNTER — Other Ambulatory Visit: Payer: Self-pay

## 2021-09-02 ENCOUNTER — Ambulatory Visit (INDEPENDENT_AMBULATORY_CARE_PROVIDER_SITE_OTHER): Payer: Medicare HMO | Admitting: Internal Medicine

## 2021-09-02 VITALS — BP 130/68 | HR 70 | Resp 18 | Ht 64.5 in | Wt 152.8 lb

## 2021-09-02 DIAGNOSIS — M5442 Lumbago with sciatica, left side: Secondary | ICD-10-CM | POA: Diagnosis not present

## 2021-09-02 DIAGNOSIS — M4726 Other spondylosis with radiculopathy, lumbar region: Secondary | ICD-10-CM | POA: Diagnosis not present

## 2021-09-02 MED ORDER — METHYLPREDNISOLONE ACETATE 40 MG/ML IJ SUSP
40.0000 mg | Freq: Once | INTRAMUSCULAR | Status: AC
  Administered 2021-09-02: 40 mg via INTRAMUSCULAR

## 2021-09-02 MED ORDER — BACLOFEN 10 MG PO TABS: 10.0000 mg | ORAL_TABLET | Freq: Three times a day (TID) | ORAL | 0 refills | Status: AC

## 2021-09-02 NOTE — Patient Instructions (Signed)
We have given you the steroid shot and will send in the muscle relaxer. Call us if no improvement. ?

## 2021-09-02 NOTE — Assessment & Plan Note (Signed)
More than 2 week history, will order x-ray lumbar spine to assess given lack of improvement. No numbness with does have radiculopathy component. Given depo-medrol 40 mg IM at visit. Due to CKD is not able to take NSAIDs. Rx baclofen to help with pain 10 mg TID prn. If no relief from that we can try tramadol.  ?

## 2021-09-02 NOTE — Progress Notes (Signed)
? ?  Subjective:  ? ?Patient ID: Scott Wilkins, male    DOB: 1940-11-11, 81 y.o.   MRN: [id number] ? ?Back Pain ?Pertinent negatives include no abdominal pain or chest pain.  ?The patient is a 81 YO man coming in for low back pain. ? ?Review of Systems  ?Constitutional: Negative.   ?HENT: Negative.    ?Eyes: Negative.   ?Respiratory:  Negative for cough, chest tightness and shortness of breath.   ?Cardiovascular:  Negative for chest pain, palpitations and leg swelling.  ?Gastrointestinal:  Negative for abdominal distention, abdominal pain, constipation, diarrhea, nausea and vomiting.  ?Musculoskeletal:  Positive for back pain.  ?Skin: Negative.   ?Neurological: Negative.   ?Psychiatric/Behavioral: Negative.    ? ?Objective:  ?Physical Exam ?Constitutional:   ?   Appearance: He is well-developed.  ?HENT:  ?   Head: Normocephalic and atraumatic.  ?Cardiovascular:  ?   Rate and Rhythm: Normal rate and regular rhythm.  ?Pulmonary:  ?   Effort: Pulmonary effort is normal. No respiratory distress.  ?   Breath sounds: Normal breath sounds. No wheezing or rales.  ?Abdominal:  ?   General: Bowel sounds are normal. There is no distension.  ?   Palpations: Abdomen is soft.  ?   Tenderness: There is no abdominal tenderness. There is no rebound.  ?Musculoskeletal:  ?   Cervical back: Normal range of motion.  ?Skin: ?   General: Skin is warm and dry.  ?Neurological:  ?   Mental Status: He is alert and oriented to person, place, and time.  ?   Coordination: Coordination normal.  ? ? ?Vitals:  ? 09/02/21 1013  ?BP: 130/68  ?Pulse: 70  ?Resp: 18  ?SpO2: 94%  ?Weight: 152 lb 12.8 oz (69.3 kg)  ?Height: 5' 4.5" (1.638 m)  ? ? ?This visit occurred during the SARS-CoV-2 public health emergency.  Safety protocols were in place, including screening questions prior to the visit, additional usage of staff PPE, and extensive cleaning of exam room while observing appropriate contact time as indicated for disinfecting solutions.  ? ?Assessment  & Plan:  ?Depo-medrol 40 mg IM given at visit ?

## 2021-09-03 ENCOUNTER — Other Ambulatory Visit: Payer: Self-pay | Admitting: Urology

## 2021-09-03 DIAGNOSIS — R3914 Feeling of incomplete bladder emptying: Secondary | ICD-10-CM | POA: Diagnosis not present

## 2021-09-03 DIAGNOSIS — R3912 Poor urinary stream: Secondary | ICD-10-CM | POA: Diagnosis not present

## 2021-09-03 DIAGNOSIS — N35013 Post-traumatic anterior urethral stricture: Secondary | ICD-10-CM | POA: Diagnosis not present

## 2021-09-06 NOTE — Progress Notes (Addendum)
COVID Vaccine Completed: yes x2 ?Date COVID Vaccine completed: 09/12/19, 10/07/19 ?Has received booster: ?COVID vaccine manufacturer: Pfizer     ? ?Date of COVID positive in last 90 days: no ? ?PCP - Pricilla Holm, MD ?Cardiologist - [place] ? ?Chest x-ray - n/a ?EKG - 11/04/20 Epic ?Stress Test - n/a ?ECHO - greater than 2 years ?Cardiac Cath - 2016 ?Pacemaker/ICD device last checked: n/a ?Spinal Cord Stimulator: n/a ? ?Bowel Prep - no ? ?Sleep Study - n/a ?CPAP -  ? ?Fasting Blood Sugar - 130-200 ?Checks Blood Sugar 2-3 times a day ? ?Blood Thinner Instructions: ?Aspirin Instructions: ASA 81 ?Last Dose: 09/05/21 ? ?Activity level: Can go up a flight of stairs and perform activities of daily living without stopping and without symptoms of chest pain or shortness of breath. ?   ? 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 ?Patient denies shortness of breath, fever, cough and chest pain at PAT appointment ? ? ?Patient verbalized understanding of instructions that were given to them at the PAT appointment. Patient was also instructed that they will need to review over the PAT instructions again at home before surgery.  ?

## 2021-09-06 NOTE — Patient Instructions (Addendum)
DUE TO COVID-19 ONLY ONE VISITOR  (aged 79 and older)  IS ALLOWED TO COME [id number] YOU AND STAY IN THE WAITING ROOM ONLY DURING PRE OP AND PROCEDURE.   ?**NO VISITORS ARE ALLOWED IN THE SHORT STAY AREA OR RECOVERY ROOM!!** ? ? Your procedure is scheduled on: 09/08/21 ? ? [facility] ? ?  Report to admitting at 8:45 AM ? ? Call this number if you have problems the morning of surgery [phone] ? ? Do not eat food :After Midnight. ? ? After Midnight you may have the following liquids until 8:00 AM DAY OF SURGERY ? ?Water ?Black Coffee (sugar ok, NO MILK/CREAM OR CREAMERS)  ?Tea (sugar ok, NO MILK/CREAM OR CREAMERS) regular and decaf                             ?Plain Jell-O (NO RED)                                           ?Fruit ices (not with fruit pulp, NO RED)                                     ?Popsicles (NO RED)                                                                  ?Juice: apple, WHITE grape, WHITE cranberry ?Sports drinks like Gatorade (NO RED) ?Clear broth(vegetable,chicken,beef) ? ?FOLLOW BOWEL PREP AND ANY ADDITIONAL PRE OP INSTRUCTIONS YOU RECEIVED FROM YOUR SURGEON'S OFFICE!!! ?  ?  ?Oral Hygiene is also important to reduce your risk of infection.                                    ?Remember - BRUSH YOUR TEETH THE MORNING OF SURGERY WITH YOUR REGULAR TOOTHPASTE ? ? Take these medicines the morning of surgery with A SIP OF WATER: Tylenol, Carvedilol.  ? ?DO NOT TAKE ANY ORAL DIABETIC MEDICATIONS DAY OF YOUR SURGERY ? ?How to Manage Your Diabetes ?Before and After Surgery ? ?Why is it important to control my blood sugar before and after surgery? ?Improving blood sugar levels before and after surgery helps healing and can limit problems. ?A way of improving blood sugar control is eating a healthy diet by: ? Eating less sugar and carbohydrates ? Increasing activity/exercise ? Talking with your doctor about reaching your blood sugar goals ?High blood sugars (greater  than 180 mg/dL) can raise your risk of infections and slow your recovery, so you will need to focus on controlling your diabetes during the weeks before surgery. ?Make sure that the doctor who takes care of your diabetes knows about your planned surgery including the date and location. ? ?How do I manage my blood sugar before surgery? ?Check your blood sugar at least 4 times a day, starting 2 days before surgery, to make sure that the level is not too high or low. ?Check  your blood sugar the morning of your surgery when you wake up and every 2 hours until you get to the Short Stay unit. ?If your blood sugar is less than 70 mg/dL, you will need to treat for low blood sugar: ?Do not take insulin. ?Treat a low blood sugar (less than 70 mg/dL) with ? cup of clear juice (cranberry or apple), 4 glucose tablets, OR glucose gel. ?Recheck blood sugar in 15 minutes after treatment (to make sure it is greater than 70 mg/dL). If your blood sugar is not greater than 70 mg/dL on recheck, call [phone] for further instructions. ?Report your blood sugar to the short stay nurse when you get to Short Stay. ? ?If you are admitted to the hospital after surgery: ?Your blood sugar will be checked by the staff and you will probably be given insulin after surgery (instead of oral diabetes medicines) to make sure you have good blood sugar levels. ?The goal for blood sugar control after surgery is 80-180 mg/dL. ? ? ?WHAT DO I DO ABOUT MY DIABETES MEDICATION? ? ?Do not take oral diabetes medicines (pills) the morning of surgery. ? ?THE DAY BEFORE SURGERY, take morning dose of NPH as prescribed. Take 50% of evening NPH.     ? ? ?THE MORNING OF SURGERY, take 50% of NPH. ? ?The day of surgery, do not take other diabetes injectables, including Byetta (exenatide), Bydureon (exenatide ER), Victoza (liraglutide), or Trulicity (dulaglutide). ? ?Reviewed and Endorsed by [facility] Patient Education Committee, August 2015  ?                  ?            You may not have any metal on your body including jewelry, and body piercing ? ?           Do not wear lotions, powders, cologne, or deodorant ? ?            Men may shave face and neck. ? ? Do not bring valuables to the hospital. [facility] IS NOT ?            RESPONSIBLE   FOR VALUABLES. ? ? Contacts, dentures or bridgework may not be worn into surgery. ?  ? Patients discharged on the day of surgery will not be allowed to drive home.  Someone NEEDS to stay with you for the first 24 hours after anesthesia. ? ?            Please read over the following fact sheets you were given: IF YOU HAVE QUESTIONS ABOUT YOUR PRE-OP INSTRUCTIONS PLEASE CALL [phone]- Fleet Contras ? ?   [facility] - Preparing for Surgery ?Before surgery, you can play an important role.  Because skin is not sterile, your skin needs to be as free of germs as possible.  You can reduce the number of germs on your skin by washing with CHG (chlorahexidine gluconate) soap before surgery.  CHG is an antiseptic cleaner which kills germs and bonds with the skin to continue killing germs even after washing. ?Please DO NOT use if you have an allergy to CHG or antibacterial soaps.  If your skin becomes reddened/irritated stop using the CHG and inform your nurse when you arrive at Short Stay. ?Do not shave (including legs and underarms) for at least 48 hours prior to the first CHG shower.  You may shave your face/neck. ? ?Please follow these instructions carefully: ? 1.  Shower with CHG Soap the night  before surgery and the  morning of surgery. ? 2.  If you choose to wash your hair, wash your hair first as usual with your normal  shampoo. ? 3.  After you shampoo, rinse your hair and body thoroughly to remove the shampoo.                            ? 4.  Use CHG as you would any other liquid soap.  You can apply chg directly to the skin and wash.  Gently with a scrungie or clean washcloth. ? 5.  Apply the CHG Soap to your body ONLY FROM THE NECK DOWN.   Do    not use on face/ open      ?                     Wound or open sores. Avoid contact with eyes, ears mouth and   genitals (private parts).  ?                     Engineering geologist,  Genitals (private parts) with your normal soap. ?            6.  Wash thoroughly, paying special attention to the area where your    surgery  will be performed. ? 7.  Thoroughly rinse your body with warm water from the neck down. ? 8.  DO NOT shower/wash with your normal soap after using and rinsing off the CHG Soap. ?               9.  Pat yourself dry with a clean towel. ?           10.  Wear clean pajamas. ?           11.  Place clean sheets on your bed the night of your first shower and do not  sleep with pets. ?Day of Surgery : ?Do not apply any lotions/deodorants the morning of surgery.  Please wear clean clothes to the hospital/surgery center. ? ?FAILURE TO FOLLOW THESE INSTRUCTIONS MAY RESULT IN THE CANCELLATION OF YOUR SURGERY ? ?PATIENT SIGNATURE_________________________________ ? ?NURSE SIGNATURE__________________________________ ? ?________________________________________________________________________  ?

## 2021-09-07 ENCOUNTER — Encounter (HOSPITAL_COMMUNITY): Payer: Self-pay

## 2021-09-07 ENCOUNTER — Other Ambulatory Visit: Payer: Self-pay

## 2021-09-07 ENCOUNTER — Encounter (HOSPITAL_COMMUNITY)
Admission: RE | Admit: 2021-09-07 | Discharge: 2021-09-07 | Disposition: A | Discharge status: Home / Self Care | Payer: Medicare HMO | Source: Ambulatory Visit | Attending: Urology | Admitting: Urology

## 2021-09-07 VITALS — BP 129/84 | HR 66 | Temp 97.8°F | Resp 16 | Ht 64.0 in | Wt 149.0 lb

## 2021-09-07 DIAGNOSIS — I10 Essential (primary) hypertension: Secondary | ICD-10-CM

## 2021-09-07 DIAGNOSIS — N35919 Unspecified urethral stricture, male, unspecified site: Secondary | ICD-10-CM | POA: Diagnosis not present

## 2021-09-07 DIAGNOSIS — K760 Fatty (change of) liver, not elsewhere classified: Secondary | ICD-10-CM | POA: Diagnosis not present

## 2021-09-07 DIAGNOSIS — Z955 Presence of coronary angioplasty implant and graft: Secondary | ICD-10-CM | POA: Insufficient documentation

## 2021-09-07 DIAGNOSIS — E1122 Type 2 diabetes mellitus with diabetic chronic kidney disease: Secondary | ICD-10-CM | POA: Insufficient documentation

## 2021-09-07 DIAGNOSIS — I2510 Atherosclerotic heart disease of native coronary artery without angina pectoris: Secondary | ICD-10-CM | POA: Insufficient documentation

## 2021-09-07 DIAGNOSIS — E119 Type 2 diabetes mellitus without complications: Secondary | ICD-10-CM

## 2021-09-07 DIAGNOSIS — Z01812 Encounter for preprocedural laboratory examination: Secondary | ICD-10-CM | POA: Insufficient documentation

## 2021-09-07 DIAGNOSIS — N183 Chronic kidney disease, stage 3 unspecified: Secondary | ICD-10-CM | POA: Insufficient documentation

## 2021-09-07 DIAGNOSIS — I129 Hypertensive chronic kidney disease with stage 1 through stage 4 chronic kidney disease, or unspecified chronic kidney disease: Secondary | ICD-10-CM | POA: Diagnosis not present

## 2021-09-07 HISTORY — DX: Sciatica, unspecified side: M54.30

## 2021-09-07 HISTORY — DX: Personal history of urinary calculi: Z87.442

## 2021-09-07 HISTORY — DX: Unspecified osteoarthritis, unspecified site: M19.90

## 2021-09-07 HISTORY — DX: Headache, unspecified: R51.9

## 2021-09-07 LAB — CBC
HCT: 44.5 % (ref 39.0–52.0)
Hemoglobin: 15.2 g/dL (ref 13.0–17.0)
MCH: 31.7 pg (ref 26.0–34.0)
MCHC: 34.2 g/dL (ref 30.0–36.0)
MCV: 92.7 fL (ref 80.0–100.0)
Platelets: 118 10*3/uL — ABNORMAL LOW (ref 150–400)
RBC: 4.8 MIL/uL (ref 4.22–5.81)
RDW: 12.5 % (ref 11.5–15.5)
WBC: 6.3 10*3/uL (ref 4.0–10.5)
nRBC: 0 % (ref 0.0–0.2)

## 2021-09-07 LAB — COMPREHENSIVE METABOLIC PANEL
ALT: 31 U/L (ref 0–44)
AST: 24 U/L (ref 15–41)
Albumin: 4.1 g/dL (ref 3.5–5.0)
Alkaline Phosphatase: 116 U/L (ref 38–126)
Anion gap: 7 (ref 5–15)
BUN: 42 mg/dL — ABNORMAL HIGH (ref 8–23)
CO2: 25 mmol/L (ref 22–32)
Calcium: 9.1 mg/dL (ref 8.9–10.3)
Chloride: 106 mmol/L (ref 98–111)
Creatinine, Ser: 1.66 mg/dL — ABNORMAL HIGH (ref 0.61–1.24)
GFR, Estimated: 42 mL/min — ABNORMAL LOW (ref >60)
Glucose, Bld: 233 mg/dL — ABNORMAL HIGH (ref 70–99)
Potassium: 3.9 mmol/L (ref 3.5–5.1)
Sodium: 138 mmol/L (ref 135–145)
Total Bilirubin: 0.7 mg/dL (ref 0.3–1.2)
Total Protein: 7.2 g/dL (ref 6.5–8.1)

## 2021-09-07 LAB — HEMOGLOBIN A1C
Hgb A1c MFr Bld: 8.1 % — ABNORMAL HIGH (ref 4.8–5.6)
Mean Plasma Glucose: 185.77 mg/dL

## 2021-09-07 LAB — GLUCOSE, CAPILLARY: Glucose-Capillary: 224 mg/dL — ABNORMAL HIGH (ref 70–99)

## 2021-09-07 NOTE — Anesthesia Preprocedure Evaluation (Addendum)
Anesthesia Evaluation  ?Patient identified by MRN, date of birth, ID band ?Patient awake ? ? ? ?Reviewed: ?Allergy & Precautions, H&P , NPO status , Patient's Chart, lab work & pertinent test results, reviewed documented beta blocker date and time  ? ?Airway ?Mallampati: II ? ?TM Distance: >3 FB ?Neck ROM: Full ? ? ? Dental ?no notable dental hx. ?(+) Upper Dentures, Edentulous Lower, Dental Advisory Given ?  ?Pulmonary ?neg pulmonary ROS,  ?  ?Pulmonary exam normal ?breath sounds clear to auscultation ? ? ? ? ? ? Cardiovascular ?hypertension, Pt. on medications and Pt. on home beta blockers ?+ CAD and + Cardiac Stents  ? ?Rhythm:Regular Rate:Normal ? ? ?  ?Neuro/Psych ? Headaches, Seizures -,  negative psych ROS  ? GI/Hepatic ?negative GI ROS, Neg liver ROS,   ?Endo/Other  ?diabetes, Poorly Controlled, Insulin Dependent ? Renal/GU ?Renal InsufficiencyRenal disease  ?negative genitourinary ?  ?Musculoskeletal ? ?(+) Arthritis , Osteoarthritis,   ? Abdominal ?  ?Peds ? Hematology ?negative hematology ROS ?(+)   ?Anesthesia Other Findings ? ? Reproductive/Obstetrics ?negative OB ROS ? ?  ? ? ? ? ? ? ? ? ? ? ? ? ? ?  ?  ? ? ? ? ? ?Anesthesia Physical ?Anesthesia Plan ? ?ASA: 3 ? ?Anesthesia Plan: General  ? ?Post-op Pain Management: Tylenol PO (pre-op)*  ? ?Induction: Intravenous ? ?PONV Risk Score and Plan: 3 and Ondansetron, Dexamethasone and Treatment may vary due to age or medical condition ? ?Airway Management Planned: LMA ? ?Additional Equipment:  ? ?Intra-op Plan:  ? ?Post-operative Plan: Extubation in OR ? ?Informed Consent: I have reviewed the patients History and Physical, chart, labs and discussed the procedure including the risks, benefits and alternatives for the proposed anesthesia with the patient or authorized representative who has indicated his/her understanding and acceptance.  ? ? ? ?Dental advisory given ? ?Plan Discussed with: CRNA ? ?Anesthesia Plan Comments:  (See PAT note 09/07/21, Konrad Felix Ward, PA-C)  ? ? ? ? ? ?Anesthesia Quick Evaluation ? ?

## 2021-09-07 NOTE — Progress Notes (Signed)
Anesthesia Chart Review ? ? Case: [id number] Date/Time: 09/08/21 1045  ? Procedure: CYSTOSCOPY WITH OPTILUM URETHRAL DILATATION - 67 MINS FOR CASE  ? Anesthesia type: General  ? Pre-op diagnosis: URETHRAL STRICTURE  ? [facility]  ? Surgeons: Lucas Mallow, MD  ? ?  ? ? ?DISCUSSION:80 y.o. never smoker with h/o HTN, DM II, CKD Stage III, CAD (DES 2016), urethral stricture scheduled for above procedure 09/08/21 with Dr. Link Snuffer.  ? ?Pt last seen by cardiology 11/04/20. Stable at this visit with one year follow up recommended. Pt reports to PAT nurse he can climb a flight of stairs without difficulty, denies cv sx.  ? ?Anticipate pt can proceed with planned procedure barring acute status change.   ?VS: BP 129/84   Pulse 66   Temp 36.6 ?C (Oral)   Resp 16   Ht 5' 4"  (1.626 m)   Wt 67.6 kg   SpO2 98%   BMI 25.58 kg/m?  ? ?PROVIDERS: ?Hoyt Koch, MD is PCP  ? ?Martinique, Peter, MD is Cardiologist  ?LABS: Labs reviewed: Acceptable for surgery. ?(all labs ordered are listed, but only abnormal results are displayed) ? ?Labs Reviewed  ?HEMOGLOBIN A1C - Abnormal; Notable for the following components:  ?    Result Value  ? Hgb A1c MFr Bld 8.1 (*)   ? All other components within normal limits  ?COMPREHENSIVE METABOLIC PANEL - Abnormal; Notable for the following components:  ? Glucose, Bld 233 (*)   ? BUN 42 (*)   ? Creatinine, Ser 1.66 (*)   ? GFR, Estimated 42 (*)   ? All other components within normal limits  ?CBC - Abnormal; Notable for the following components:  ? Platelets 118 (*)   ? All other components within normal limits  ?GLUCOSE, CAPILLARY - Abnormal; Notable for the following components:  ? Glucose-Capillary 224 (*)   ? All other components within normal limits  ? ? ? ?IMAGES: ? ? ?EKG: ?11/04/20 ?Rate 61 bpm  ?NSR ? ?CV: ?Echo 07/04/2014 ?Study Conclusions  ? ?- Left ventricle: Distal septal hypokinesis Definity used due to  ?  poor endocardial definition. The cavity size  was normal. Systolic  ?  function was normal. The estimated ejection fraction was in the  ?  range of 50% to 55%. Wall motion was normal; there were no  ?  regional wall motion abnormalities.  ?- Aortic valve: Calcified non coronary cusp.  ?- Atrial septum: No defect or patent foramen ovale was identified. ?Past Medical History:  ?Diagnosis Date  ? Arthritis   ? CAD (coronary artery disease)   ? a. 06/2014 Lateral STEMI/PCI: LM nl, LAD 90d, small, D1 95 (PTCA only), LCX 90p (3.5 x 16 Promus DES), RCA dominant, 24m RPDA 569m ? CKD (chronic [place] disease), stage III [place]  ? Diabetes mellitus   ? Headache   ? History of kidney stones   ? Hyperlipidemia   ? Hypertension   ? Rosacea   ? Sciatica   ? Seizures ([place]  ? h/o - none in years  ? ? ?Past Surgical History:  ?Procedure Laterality Date  ? CRANIOTOMY  06/13/1946  ? left craniectomy after head trauma  ? CYSTOSCOPY    ? LEFT HEART CATHETERIZATION WITH CORONARY ANGIOGRAM N/A 07/04/2014  ? Procedure: LEFT HEART CATHETERIZATION WITH CORONARY ANGIOGRAM;  Surgeon: Peter M JoMartiniqueMD;  [facility];  Service: Cardiovascular;  Laterality: N/A;  ? MULTIPLE TOOTH  EXTRACTIONS    ? PERCUTANEOUS CORONARY STENT INTERVENTION (PCI-S)  07/04/2014  ? Procedure: PERCUTANEOUS CORONARY STENT INTERVENTION (PCI-S);  Surgeon: Peter M Martinique, MD;  [facility] C[facility] LAB;  Service: Cardiovascular;;  prox CFX  ? RIB FRACTURE SURGERY  06/13/1974  ? ORIF after frcture due to trauma  ? right leg sur    ? ? ?MEDICATIONS: ? Accu-Chek Softclix Lancets lancets  ? acetaminophen (TYLENOL) 500 MG tablet  ? aspirin 81 MG chewable tablet  ? atorvastatin (LIPITOR) 80 MG tablet  ? baclofen (LIORESAL) 10 MG tablet  ? Blood Glucose Calibration (ACCU-CHEK AVIVA) SOLN  ? Blood Glucose Monitoring Suppl (TRUE METRIX METER) w/Device KIT  ? carvedilol (COREG) 3.125 MG tablet  ? donepezil (ARICEPT) 5 MG tablet  ? Dulaglutide (TRULICITY) 1.5 CL/2.7NT SOPN  ? glucose blood (TRUE METRIX PRO BLOOD GLUCOSE)  test strip  ? insulin NPH-regular Human (70-30) 100 UNIT/ML injection  ? lisinopril (ZESTRIL) 10 MG tablet  ? loperamide (IMODIUM A-D) 2 MG tablet  ? nitroGLYCERIN (NITROSTAT) 0.4 MG SL tablet  ? Simethicone (GAS-X PO)  ? tamsulosin (FLOMAX) 0.4 MG CAPS capsule  ? vitamin B-12 (CYANOCOBALAMIN) 1000 MCG tablet  ? ?No current facility-administered medications for this encounter.  ? ? ? ?Konrad Felix Ward, PA-C ?[facility] ?[phone] ?  ? ? ? ? ?

## 2021-09-08 ENCOUNTER — Encounter (HOSPITAL_COMMUNITY): Payer: Self-pay | Admitting: Urology

## 2021-09-08 ENCOUNTER — Ambulatory Visit (HOSPITAL_COMMUNITY)
Admission: RE | Admit: 2021-09-08 | Discharge: 2021-09-08 | Disposition: A | Discharge status: Home / Self Care | Payer: Medicare HMO | Source: Ambulatory Visit | Attending: Urology | Admitting: Urology

## 2021-09-08 ENCOUNTER — Encounter (HOSPITAL_COMMUNITY)
Admission: RE | Disposition: A | Discharge status: Home / Self Care | Payer: Self-pay | Source: Ambulatory Visit | Attending: Urology

## 2021-09-08 ENCOUNTER — Ambulatory Visit (HOSPITAL_COMMUNITY): Payer: Medicare HMO

## 2021-09-08 ENCOUNTER — Ambulatory Visit (HOSPITAL_COMMUNITY): Payer: Medicare HMO | Admitting: Physician Assistant

## 2021-09-08 ENCOUNTER — Ambulatory Visit (HOSPITAL_BASED_OUTPATIENT_CLINIC_OR_DEPARTMENT_OTHER): Payer: Medicare HMO | Admitting: Certified Registered"

## 2021-09-08 DIAGNOSIS — E1165 Type 2 diabetes mellitus with hyperglycemia: Secondary | ICD-10-CM

## 2021-09-08 DIAGNOSIS — Z955 Presence of coronary angioplasty implant and graft: Secondary | ICD-10-CM | POA: Diagnosis not present

## 2021-09-08 DIAGNOSIS — E1122 Type 2 diabetes mellitus with diabetic chronic kidney disease: Secondary | ICD-10-CM | POA: Insufficient documentation

## 2021-09-08 DIAGNOSIS — R338 Other retention of urine: Secondary | ICD-10-CM | POA: Diagnosis not present

## 2021-09-08 DIAGNOSIS — N35819 Other urethral stricture, male, unspecified site: Secondary | ICD-10-CM

## 2021-09-08 DIAGNOSIS — I10 Essential (primary) hypertension: Secondary | ICD-10-CM

## 2021-09-08 DIAGNOSIS — I129 Hypertensive chronic kidney disease with stage 1 through stage 4 chronic kidney disease, or unspecified chronic kidney disease: Secondary | ICD-10-CM | POA: Diagnosis not present

## 2021-09-08 DIAGNOSIS — N289 Disorder of kidney and ureter, unspecified: Secondary | ICD-10-CM | POA: Diagnosis not present

## 2021-09-08 DIAGNOSIS — M1990 Unspecified osteoarthritis, unspecified site: Secondary | ICD-10-CM | POA: Diagnosis not present

## 2021-09-08 DIAGNOSIS — Z794 Long term (current) use of insulin: Secondary | ICD-10-CM | POA: Insufficient documentation

## 2021-09-08 DIAGNOSIS — E119 Type 2 diabetes mellitus without complications: Secondary | ICD-10-CM

## 2021-09-08 DIAGNOSIS — N35919 Unspecified urethral stricture, male, unspecified site: Secondary | ICD-10-CM | POA: Diagnosis not present

## 2021-09-08 DIAGNOSIS — K760 Fatty (change of) liver, not elsewhere classified: Secondary | ICD-10-CM

## 2021-09-08 DIAGNOSIS — I2510 Atherosclerotic heart disease of native coronary artery without angina pectoris: Secondary | ICD-10-CM | POA: Insufficient documentation

## 2021-09-08 DIAGNOSIS — N189 Chronic kidney disease, unspecified: Secondary | ICD-10-CM | POA: Diagnosis not present

## 2021-09-08 DIAGNOSIS — N99115 Postprocedural fossa navicularis urethral stricture: Secondary | ICD-10-CM | POA: Diagnosis not present

## 2021-09-08 DIAGNOSIS — G40802 Other epilepsy, not intractable, without status epilepticus: Secondary | ICD-10-CM | POA: Diagnosis not present

## 2021-09-08 HISTORY — PX: CYSTOSCOPY WITH URETHRAL DILATATION: SHX5125

## 2021-09-08 LAB — GLUCOSE, CAPILLARY
Glucose-Capillary: 226 mg/dL — ABNORMAL HIGH (ref 70–99)
Glucose-Capillary: 180 mg/dL — ABNORMAL HIGH (ref 70–99)
Glucose-Capillary: 182 mg/dL — ABNORMAL HIGH (ref 70–99)

## 2021-09-08 SURGERY — CYSTOSCOPY, WITH URETHRAL DILATION
Anesthesia: General

## 2021-09-08 MED ORDER — DEXAMETHASONE SODIUM PHOSPHATE 10 MG/ML IJ SOLN
INTRAMUSCULAR | Status: DC | PRN
  Administered 2021-09-08: 4 mg via INTRAVENOUS

## 2021-09-08 MED ORDER — ONDANSETRON HCL 4 MG/2ML IJ SOLN
INTRAMUSCULAR | Status: AC
  Filled 2021-09-08: qty 2

## 2021-09-08 MED ORDER — LIDOCAINE 2% (20 MG/ML) 5 ML SYRINGE
INTRAMUSCULAR | Status: DC | PRN
  Administered 2021-09-08: 60 mg via INTRAVENOUS

## 2021-09-08 MED ORDER — OXYCODONE HCL 5 MG PO TABS: 5.0000 mg | ORAL_TABLET | ORAL | 0 refills | Status: DC | PRN

## 2021-09-08 MED ORDER — CEFAZOLIN SODIUM-DEXTROSE 2-4 GM/100ML-% IV SOLN
2.0000 g | INTRAVENOUS | Status: AC
  Administered 2021-09-08: 2 g via INTRAVENOUS
  Filled 2021-09-08: qty 100

## 2021-09-08 MED ORDER — EPHEDRINE 5 MG/ML INJ
INTRAVENOUS | Status: AC
  Filled 2021-09-08: qty 5

## 2021-09-08 MED ORDER — PROPOFOL 10 MG/ML IV BOLUS
INTRAVENOUS | Status: DC | PRN
  Administered 2021-09-08: 140 mg via INTRAVENOUS

## 2021-09-08 MED ORDER — FENTANYL CITRATE (PF) 100 MCG/2ML IJ SOLN
INTRAMUSCULAR | Status: AC
  Filled 2021-09-08: qty 2

## 2021-09-08 MED ORDER — PROPOFOL 10 MG/ML IV BOLUS
INTRAVENOUS | Status: AC
  Filled 2021-09-08: qty 20

## 2021-09-08 MED ORDER — LIDOCAINE HCL (PF) 2 % IJ SOLN
INTRAMUSCULAR | Status: AC
  Filled 2021-09-08: qty 5

## 2021-09-08 MED ORDER — FENTANYL CITRATE (PF) 100 MCG/2ML IJ SOLN
INTRAMUSCULAR | Status: DC | PRN
  Administered 2021-09-08: 25 ug via INTRAVENOUS

## 2021-09-08 MED ORDER — INSULIN ASPART 100 UNIT/ML IJ SOLN
6.0000 [IU] | Freq: Once | INTRAMUSCULAR | Status: AC
  Administered 2021-09-08: 6 [IU] via SUBCUTANEOUS

## 2021-09-08 MED ORDER — CHLORHEXIDINE GLUCONATE 0.12 % MT SOLN
15.0000 mL | Freq: Once | OROMUCOSAL | Status: AC
  Administered 2021-09-08: 15 mL via OROMUCOSAL

## 2021-09-08 MED ORDER — ORAL CARE MOUTH RINSE: 15.0000 mL | Freq: Once | OROMUCOSAL | Status: AC

## 2021-09-08 MED ORDER — LACTATED RINGERS IV SOLN: INTRAVENOUS | Status: DC

## 2021-09-08 MED ORDER — ONDANSETRON HCL 4 MG/2ML IJ SOLN
INTRAMUSCULAR | Status: DC | PRN
  Administered 2021-09-08: 4 mg via INTRAVENOUS

## 2021-09-08 MED ORDER — DEXAMETHASONE SODIUM PHOSPHATE 10 MG/ML IJ SOLN
INTRAMUSCULAR | Status: AC
  Filled 2021-09-08: qty 1

## 2021-09-08 MED ORDER — STERILE WATER FOR IRRIGATION IR SOLN
Status: DC | PRN
  Administered 2021-09-08: 3000 mL

## 2021-09-08 MED ORDER — EPHEDRINE SULFATE-NACL 50-0.9 MG/10ML-% IV SOSY
PREFILLED_SYRINGE | INTRAVENOUS | Status: DC | PRN
  Administered 2021-09-08 (×2): 10 mg via INTRAVENOUS

## 2021-09-08 MED ORDER — ACETAMINOPHEN 500 MG PO TABS
1000.0000 mg | ORAL_TABLET | Freq: Once | ORAL | Status: AC
  Administered 2021-09-08: 1000 mg via ORAL
  Filled 2021-09-08: qty 2

## 2021-09-08 MED ORDER — INSULIN ASPART 100 UNIT/ML IJ SOLN
INTRAMUSCULAR | Status: AC
  Filled 2021-09-08: qty 1

## 2021-09-08 SURGICAL SUPPLY — 25 items
BAG DRN RND TRDRP ANRFLXCHMBR (UROLOGICAL SUPPLIES) ×1
BAG URINE DRAIN 2000ML AR STRL (UROLOGICAL SUPPLIES) ×3 IMPLANT
BALLN NEPHROSTOMY (BALLOONS)
BALLN OPTILUME DCB 30X5X75 (BALLOONS) ×2
BALLOON NEPHROSTOMY (BALLOONS) IMPLANT
BALLOON OPTILUME DCB 30X5X75 (BALLOONS) IMPLANT
CATH FOLEY 2W COUNCIL 20FR 5CC (CATHETERS) IMPLANT
CATH FOLEY 2WAY SLVR  5CC 18FR (CATHETERS) ×2
CATH FOLEY 2WAY SLVR 5CC 18FR (CATHETERS) IMPLANT
CATH ROBINSON RED A/P 14FR (CATHETERS) ×3 IMPLANT
CATH URET 5FR 28IN CONE TIP (BALLOONS)
CATH URET 5FR 70CM CONE TIP (BALLOONS) IMPLANT
CATH URETL OPEN END 6FR 70 (CATHETERS) IMPLANT
CLOTH BEACON ORANGE TIMEOUT ST (SAFETY) ×3 IMPLANT
DEVICE INFLATION ATRION QL4015 (MISCELLANEOUS) ×1 IMPLANT
GLOVE SURG ENC MOIS LTX SZ7.5 (GLOVE) ×3 IMPLANT
GOWN STRL REUS W/ TWL XL LVL3 (GOWN DISPOSABLE) ×2 IMPLANT
GOWN STRL REUS W/TWL XL LVL3 (GOWN DISPOSABLE) ×2
GUIDEWIRE ANG ZIPWIRE 038X150 (WIRE) IMPLANT
GUIDEWIRE STR DUAL SENSOR (WIRE) ×3 IMPLANT
MANIFOLD NEPTUNE II (INSTRUMENTS) ×1 IMPLANT
NS IRRIG 1000ML POUR BTL (IV SOLUTION) ×1 IMPLANT
PACK CYSTO (CUSTOM PROCEDURE TRAY) ×3 IMPLANT
PENCIL SMOKE EVACUATOR (MISCELLANEOUS) IMPLANT
WATER STERILE IRR 3000ML UROMA (IV SOLUTION) ×3 IMPLANT
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
## 2021-09-08 NOTE — Anesthesia Procedure Notes (Signed)
Procedure Name: LMA Insertion ?Date/Time: 09/08/2021 11:01 AM ?Performed by: Nelle Don, CRNA ?Pre-anesthesia Checklist: Patient identified, Emergency Drugs available, Suction available and Patient being monitored ?Patient Re-evaluated:Patient Re-evaluated prior to induction ?Oxygen Delivery Method: Circle system utilized ?Preoxygenation: Pre-oxygenation with 100% oxygen ?Induction Type: IV induction ?LMA: LMA with gastric port inserted ?LMA Size: 4.0 ?Number of attempts: 1 ?Dental Injury: Teeth and Oropharynx as per pre-operative assessment  ? ? ? ? ?

## 2021-09-08 NOTE — Anesthesia Postprocedure Evaluation (Signed)
Anesthesia Post Note ? ?Patient: RESEAN BRANDER ? ?Procedure(s) Performed: CYSTOSCOPY WITH OPTILUM URETHRAL DILATATION ? ?  ? ?Patient location during evaluation: PACU ?Anesthesia Type: General ?Level of consciousness: awake and alert ?Pain management: pain level controlled ?Vital Signs Assessment: post-procedure vital signs reviewed and stable ?Respiratory status: spontaneous breathing, nonlabored ventilation and respiratory function stable ?Cardiovascular status: blood pressure returned to baseline and stable ?Postop Assessment: no apparent nausea or vomiting ?Anesthetic complications: no ? ? ?No notable events documented. ? ?Last Vitals:  ?Vitals:  ? 09/08/21 1215 09/08/21 1255  ?BP: (!) 142/82 135/72  ?Pulse: 63 65  ?Resp: 12 16  ?Temp: 36.4 ?C 36.4 ?C  ?SpO2: 99% 99%  ?  ?Last Pain:  ?Vitals:  ? 09/08/21 1255  ?TempSrc:   ?PainSc: 0-No pain  ? ? ?  ?  ?  ?  ?  ?  ? ?Faizan Geraci,W. EDMOND ? ? ? ? ?

## 2021-09-08 NOTE — Op Note (Signed)
Operative Note ? ?Preoperative diagnosis:  ?1.  Fossa navicularis urethral stricture ? ?Postoperative diagnosis: ?1.  Same ? ?Procedure(s): ?1.  Cystoscopy with optilume urethral dilation ? ?Surgeon: Modena Slater, MD ? ?Assistants: None ? ?Anesthesia: General ? ?Complications: None immediate ? ?EBL: Minimal ? ?Specimens: ?1.  None ? ?Drains/Catheters: ?1.  18 French Foley catheter ? ?Intraoperative findings: Approximately 6 French fossa navicularis urethral stricture that was short and relatively dense.  Remainder of the urethra normal.  Borderline obstructing prostate.  Normal bladder mucosa. ? ?Indication: 81 year old male with a distal urethral stricture presents for the previously mentioned operation. ? ?Description of procedure: ? ?The patient was identified and consent was obtained.  The patient was taken to the operating room and placed in the supine position.  The patient was placed under general anesthesia.  Perioperative antibiotics were administered.  The patient was placed in dorsal lithotomy.  Patient was prepped and draped in a standard sterile fashion and a timeout was performed. ? ?I advanced a sensor wire into the urethra and into the bladder.  I then sequentially dilated with sounds from 12 [place] up to 24 [place].  I then advanced a 23 French cystoscope into the urethra and into the bladder.  Aside from the fossa navicularis stricture, there were no other urethral strictures and the prostate was borderline obstructing but no severe hyperplasia.  Bladder mucosa was normal.  I withdrew the scope. ? ?I advanced a 30 French 5 cm optilume urethral balloon dilator over the wire and into the distal ureter.  This was balloon dilated to a pressure of 8 where it remained for 5 minutes.  I then deflated the balloon and withdrew the balloon and wire.  An 49 French Foley catheter was placed with 10 cc instilled into the balloon.  This concluded the operation.  Patient tolerated the procedure well was stable  postoperative. ? ?Plan: Follow-up in about 3 days for voiding trial. ? ?

## 2021-09-08 NOTE — Transfer of Care (Signed)
Immediate Anesthesia Transfer of Care Note ? ?Patient: Scott Wilkins ? ?Procedure(s) Performed: CYSTOSCOPY WITH OPTILUM URETHRAL DILATATION ? ?Patient Location: PACU ? ?Anesthesia Type:General ? ?Level of Consciousness: drowsy ? ?Airway & Oxygen Therapy: Patient Spontanous Breathing and Patient connected to face mask oxygen ? ?Post-op Assessment: Report given to RN and Patient moving all extremities X 4 ? ?Post vital signs: Reviewed and stable ? ?Last Vitals:  ?Vitals Value Taken Time  ?BP 125/78   ?Temp    ?Pulse 66 09/08/21 1137  ?Resp 10 09/08/21 1137  ?SpO2 100 % 09/08/21 1137  ?Vitals shown include unvalidated device data. ? ?Last Pain:  ?Vitals:  ? 09/08/21 0838  ?TempSrc: Oral  ?   ? ?  ? ?Complications: No notable events documented. ?

## 2021-09-09 ENCOUNTER — Encounter (HOSPITAL_COMMUNITY): Payer: Self-pay | Admitting: Urology

## 2021-09-13 ENCOUNTER — Other Ambulatory Visit: Payer: Self-pay

## 2021-09-13 DIAGNOSIS — N35013 Post-traumatic anterior urethral stricture: Secondary | ICD-10-CM | POA: Diagnosis not present

## 2021-09-13 DIAGNOSIS — E119 Type 2 diabetes mellitus without complications: Secondary | ICD-10-CM

## 2021-09-16 ENCOUNTER — Other Ambulatory Visit: Payer: Self-pay

## 2021-09-16 ENCOUNTER — Telehealth: Payer: Self-pay

## 2021-09-16 DIAGNOSIS — E119 Type 2 diabetes mellitus without complications: Secondary | ICD-10-CM

## 2021-09-16 MED ORDER — TRULICITY 1.5 MG/0.5ML ~~LOC~~ SOAJ: 1.5000 mg | SUBCUTANEOUS | 3 refills | Status: DC

## 2021-09-16 NOTE — Telephone Encounter (Signed)
Requesting rx for trulicity to be sent to [facility], [address] [employer] and will fax over once provider signs. ?

## 2021-09-23 DIAGNOSIS — M545 Low back pain, unspecified: Secondary | ICD-10-CM | POA: Diagnosis not present

## 2021-09-23 DIAGNOSIS — M25552 Pain in left hip: Secondary | ICD-10-CM | POA: Diagnosis not present

## 2021-09-30 ENCOUNTER — Other Ambulatory Visit: Payer: Self-pay | Admitting: Cardiology

## 2021-10-02 DIAGNOSIS — M545 Low back pain, unspecified: Secondary | ICD-10-CM | POA: Diagnosis not present

## 2021-10-06 ENCOUNTER — Encounter: Payer: Self-pay | Admitting: Internal Medicine

## 2021-10-08 DIAGNOSIS — M545 Low back pain, unspecified: Secondary | ICD-10-CM | POA: Diagnosis not present

## 2021-10-08 DIAGNOSIS — M5126 Other intervertebral disc displacement, lumbar region: Secondary | ICD-10-CM | POA: Diagnosis not present

## 2021-10-12 ENCOUNTER — Ambulatory Visit: Payer: Medicare HMO | Admitting: Endocrinology

## 2021-10-12 ENCOUNTER — Encounter: Payer: Self-pay | Admitting: Endocrinology

## 2021-10-12 VITALS — BP 112/64 | HR 71 | Ht 64.0 in | Wt 151.6 lb

## 2021-10-12 DIAGNOSIS — E119 Type 2 diabetes mellitus without complications: Secondary | ICD-10-CM

## 2021-10-12 MED ORDER — TRULICITY 3 MG/0.5ML ~~LOC~~ SOAJ: 3.0000 mg | SUBCUTANEOUS | 3 refills | Status: DC

## 2021-10-12 MED ORDER — INSULIN NPH ISOPHANE & REGULAR (70-30) 100 UNIT/ML ~~LOC~~ SUSP: 20.0000 [IU] | Freq: Every day | SUBCUTANEOUS | 1 refills | Status: AC
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
## 2021-10-12 NOTE — Patient Instructions (Addendum)
check your blood sugar twice a day.  vary the time of day when you check, between before the 3 meals, and at bedtime.  also check if you have symptoms of your blood sugar being too high or too low.  please keep a record of the readings and bring it to your next appointment here (or you can bring the meter itself).  You can write it on any piece of paper.  please call us sooner if your blood sugar goes below 70, or if you have a lot of readings over 200.   ?I have sent a prescription to your pharmacy, to double the Trulicity, and:  ?please continue the same morning insulin, but stop taking it in the evening.    ?You should have an endocrinology follow-up appointment in 2 months.   ?

## 2021-10-18 ENCOUNTER — Telehealth: Payer: Self-pay

## 2021-10-18 NOTE — Telephone Encounter (Signed)
Pharmacy requesting clarification on RX for Humulin 70/30 should it be dispensed as vials or kwikpens. Please advise ?

## 2021-10-23 ENCOUNTER — Ambulatory Visit
Admission: EM | Admit: 2021-10-23 | Discharge: 2021-10-23 | Disposition: A | Discharge status: Home / Self Care | Payer: Medicare HMO | Attending: Urgent Care | Admitting: Urgent Care

## 2021-10-23 ENCOUNTER — Encounter: Payer: Self-pay | Admitting: Emergency Medicine

## 2021-10-23 DIAGNOSIS — E860 Dehydration: Secondary | ICD-10-CM

## 2021-10-23 LAB — POCT FASTING CBG KUC MANUAL ENTRY: POCT Glucose (KUC): 169 mg/dL — AB (ref 70–99)

## 2021-10-23 NOTE — ED Triage Notes (Signed)
PT here with wife who brings him in stating he was out in the sun yesterday and did not drink any water. Last night his BP reading at home was 90's systolic and then the same this morning. Pt wife states he has been feeling weak and his legs were shaky, but pt states he feels better today and that he took his dog for a walk before he came in as well as drank some water. Pt BP is stable here.  ?

## 2021-10-23 NOTE — ED Provider Notes (Signed)
?[place] ? ? ?MRN: [id number] DOB: 1940/08/31 ? ?Subjective:  ? ?Scott Wilkins is a 81 y.o. male presenting for an evaluation following episode of weakness, shakiness and low blood pressure yesterday. Patient was outdoors for hours trying to tan. Did not drink any water. Generally doesn't drink much fluids including water. Has a history of CAD, DM 2 treated with insulin, CKD III, memory loss, seizures, kidney stones. Denies having any symptoms now but his wife is concerned and wanted him evaluated. No active headache, confusion, vision changes, chest pain, shob, belly pain, n/v, hematuria, dysuria.  ? ?No current facility-administered medications for this encounter. ? ?Current Outpatient Medications:  ?  Accu-Chek Softclix Lancets lancets, 1 each by Other route 2 (two) times daily. E11.9, Disp: 200 each, Rfl: 0 ?  acetaminophen (TYLENOL) 500 MG tablet, Take 1,000-1,500 mg by mouth every 8 (eight) hours as needed for moderate pain or headache., Disp: , Rfl:  ?  aspirin 81 MG chewable tablet, Chew 1 tablet (81 mg total) by mouth daily., Disp: , Rfl:  ?  atorvastatin (LIPITOR) 80 MG tablet, TAKE 1 TABLET  DAILY AT 6 PM., Disp: 90 tablet, Rfl: 3 ?  baclofen (LIORESAL) 10 MG tablet, Take 1 tablet (10 mg total) by mouth 3 (three) times daily., Disp: 60 each, Rfl: 0 ?  Blood Glucose Calibration (ACCU-CHEK AVIVA) SOLN, 1 each by Other route as needed (Use to calibrate glucometer). E11.9, Disp: 1 each, Rfl: 0 ?  Blood Glucose Monitoring Suppl (TRUE METRIX METER) w/Device KIT, 1 each by Does not apply route 2 (two) times daily., Disp: 1 kit, Rfl: 0 ?  carvedilol (COREG) 3.125 MG tablet, TAKE 1 TABLET TWICE DAILY WITH MEALS. (PLEASE SCHEDULE AN APPOINTMENT FOR FUTURE REFILLS.), Disp: 180 tablet, Rfl: 1 ?  donepezil (ARICEPT) 5 MG tablet, Take 1 tablet (5 mg total) by mouth at bedtime., Disp: 90 tablet, Rfl: 3 ?  Dulaglutide (TRULICITY) 3 EN/2.7PO SOPN, Inject 3 mg as directed once a week., Disp:  6 mL, Rfl: 3 ?  glucose blood (TRUE METRIX PRO BLOOD GLUCOSE) test strip, Use as instructed, Disp: 100 each, Rfl: 12 ?  insulin NPH-regular Human (70-30) 100 UNIT/ML injection, Inject 20 Units into the skin daily with breakfast., Disp: 20 mL, Rfl: 1 ?  lisinopril (ZESTRIL) 10 MG tablet, TAKE 1 TABLET EVERY DAY, Disp: 90 tablet, Rfl: 1 ?  loperamide (IMODIUM A-D) 2 MG tablet, Take 4 mg by mouth 4 (four) times daily as needed for diarrhea or loose stools., Disp: , Rfl:  ?  nitroGLYCERIN (NITROSTAT) 0.4 MG SL tablet, Place 1 tablet (0.4 mg total) under the tongue every 5 (five) minutes x 3 doses as needed for chest pain., Disp: 25 tablet, Rfl: 11 ?  oxyCODONE (OXY IR/ROXICODONE) 5 MG immediate release tablet, Take 1 tablet (5 mg total) by mouth every 4 (four) hours as needed for severe pain., Disp: 30 tablet, Rfl: 0 ?  Simethicone (GAS-X PO), Take 1 tablet by mouth as needed (gas)., Disp: , Rfl:  ?  tamsulosin (FLOMAX) 0.4 MG CAPS capsule, Take 1 capsule (0.4 mg total) by mouth daily., Disp: 30 capsule, Rfl: 3 ?  vitamin B-12 (CYANOCOBALAMIN) 1000 MCG tablet, Take 1,000 mcg by mouth daily., Disp: , Rfl:   ? ?Allergies  ?Allergen Reactions  ? Metformin And Related   ?  Abdominal pain, diarrhea  ? Ozempic (0.25 Or 0.5 Mg-Dose) [Semaglutide(0.25 Or 0.78m-Dos)]   ?  Nausea, headache,blurred vision  ? ? ?Past Medical  History:  ?Diagnosis Date  ? Arthritis   ? CAD (coronary artery disease)   ? a. 06/2014 Lateral STEMI/PCI: LM nl, LAD 90d, small, D1 95 (PTCA only), LCX 90p (3.5 x 16 Promus DES), RCA dominant, 52m RPDA 537m ? CKD (chronic kidney disease), stage III ([facility]  ? Diabetes mellitus   ? Headache   ? History of kidney stones   ? Hyperlipidemia   ? Hypertension   ? Rosacea   ? Sciatica   ? [place]izures ([facility]  ? h/o - none in years  ?  ? ?Past Surgical History:  ?Procedure Laterality Date  ? CRANIOTOMY  06/13/1946  ? left craniectomy after head trauma  ? CYSTOSCOPY    ? CYSTOSCOPY WITH URETHRAL DILATATION N/A 09/08/2021  ?  Procedure: CYSTOSCOPY WITH OPTILUM URETHRAL DILATATION;  Surgeon: BeLucas MallowMD;  Location: [facility];  Service: Urology;  Laterality: N/A;  45 MINS FOR CASE  ? LEFT HEART CATHETERIZATION WITH CORONARY ANGIOGRAM N/A 07/04/2014  ? Procedure: LEFT HEART CATHETERIZATION WITH CORONARY ANGIOGRAM;  Surgeon: Peter M JoMartiniqueMD;  [facility];  Service: Cardiovascular;  Laterality: N/A;  ? MULTIPLE TOOTH EXTRACTIONS    ? PERCUTANEOUS CORONARY STENT INTERVENTION (PCI-S)  07/04/2014  ? Procedure: PERCUTANEOUS CORONARY STENT INTERVENTION (PCI-S);  Surgeon: Peter M JoMartiniqueMD;  [facility];  Service: Cardiovascular;;  prox CFX  ? RIB FRACTURE SURGERY  06/13/1974  ? ORIF after frcture due to trauma  ? right leg sur    ? ? ?Family History  ?Problem Relation Age of Onset  ? Heart disease Father   ?     CAD - died @ 7576f MI  ? Heart disease Mother   ?     CAD  ? Heart disease Brother   ?     CAD - died @ 7573f MI  ? Heart disease Brother   ?     CAD  ? Cancer Neg Hx   ? Diabetes Neg Hx   ? COPD Neg Hx   ? ? ?Social History  ? ?Tobacco Use  ? Smoking status: Never  ? Smokeless tobacco: Never  ?Vaping Use  ? Vaping Use: Never used  ?Substance Use Topics  ? Alcohol use: No  ? Drug use: No  ? ? ?ROS ? ? ?Objective:  ? ?Vitals: ?BP 120/69   Pulse 66   Temp 97.7 ?F (36.5 ?C)   Resp 20   SpO2 98%  ? ?Physical Exam ?Constitutional:   ?   General: He is not in acute distress. ?   Appearance: Normal appearance. He is well-developed. He is not ill-appearing, toxic-appearing or diaphoretic.  ?HENT:  ?   Head: Normocephalic and atraumatic.  ?   Right Ear: External ear normal.  ?   Left Ear: External ear normal.  ?   Nose: Nose normal.  ?   Mouth/Throat:  ?   Mouth: Mucous membranes are moist.  ?Eyes:  ?   General: No scleral icterus.    ?   Right eye: No discharge.     ?   Left eye: No discharge.  ?   Extraocular Movements: Extraocular movements intact.  ?Neck:  ?   Vascular: No carotid bruit.  ?Cardiovascular:  ?    Rate and Rhythm: Normal rate and regular rhythm.  ?   Heart sounds: Normal heart sounds. No murmur heard. ?  No friction rub. No gallop.  ?Pulmonary:  ?  Effort: Pulmonary effort is normal. No respiratory distress.  ?   Breath sounds: Normal breath sounds. No stridor. No wheezing, rhonchi or rales.  ?Musculoskeletal:  ?   Cervical back: Normal range of motion and neck supple.  ?Neurological:  ?   Mental Status: He is alert and oriented to person, place, and time.  ?   Cranial Nerves: No cranial nerve deficit.  ?   Motor: No weakness.  ?   Coordination: Coordination normal.  ?   Gait: Gait normal.  ?   Deep Tendon Reflexes: Reflexes normal.  ?Psychiatric:     ?   Mood and Affect: Mood normal.     ?   Behavior: Behavior normal.     ?   Thought Content: Thought content normal.     ?   Judgment: Judgment normal.  ? ? ?Results for orders placed or performed during the hospital encounter of 10/23/21 (from the past 24 hour(s))  ?POCT CBG (manual entry)     Status: Abnormal  ? Collection Time: 10/23/21  2:03 PM  ?Result Value Ref Range  ? POCT Glucose ([facility]) 169 (A) 70 - 99 mg/dL  ? ? ?Recent Results (from the past 2160 hour(s))  ?POCT HgB A1C     Status: Abnormal  ? Collection Time: 07/29/21  1:10 PM  ?Result Value Ref Range  ? Hemoglobin A1C 8.4 (A) 4.0 - 5.6 %  ? HbA1c POC (<> result, manual entry)    ? HbA1c, POC (prediabetic range)    ? HbA1c, POC (controlled diabetic range)    ?Glucose, capillary     Status: Abnormal  ? Collection Time: 09/07/21 11:30 AM  ?Result Value Ref Range  ? Glucose-Capillary 224 (H) 70 - 99 mg/dL  ?  Comment: Glucose reference range applies only to samples taken after fasting for at least 8 hours.  ?Hemoglobin A1c per protocol     Status: Abnormal  ? Collection Time: 09/07/21 11:52 AM  ?Result Value Ref Range  ? Hgb A1c MFr Bld 8.1 (H) 4.8 - 5.6 %  ?  Comment: (NOTE) ?Pre diabetes:          5.7%-6.4% ? ?Diabetes:              >6.4% ? ?Glycemic control for   <7.0% ?adults with diabetes ?  ?  Mean Plasma Glucose 185.77 mg/dL  ?  Comment: Performed at [facility], [place] [address], [place],  [address]  ?Comprehensive metabolic panel per protocol     Status: Abnormal  ? Collection T

## 2021-10-29 ENCOUNTER — Ambulatory Visit (INDEPENDENT_AMBULATORY_CARE_PROVIDER_SITE_OTHER): Payer: Medicare HMO

## 2021-10-29 DIAGNOSIS — Z0000 Encounter for general adult medical examination without abnormal findings: Secondary | ICD-10-CM | POA: Diagnosis not present

## 2021-10-29 NOTE — Progress Notes (Addendum)
 Subjective:   Scott Wilkins is a 81 y.o. male who presents for an Subsequent Medicare Annual Wellness Visit.   I connected with Scott Wilkins today by telephone and verified that I am speaking with the correct person using two identifiers. Location patient: home Location provider: work Persons participating in the virtual visit: patient, provider.   I discussed the limitations, risks, security and privacy concerns of performing an evaluation and management service by telephone and the availability of in person appointments. I also discussed with the patient that there may be a patient responsible charge related to this service. The patient expressed understanding and verbally consented to this telephonic visit.    Interactive audio and video telecommunications were attempted between this provider and patient, however failed, due to patient having technical difficulties OR patient did not have access to video capability.  We continued and completed visit with audio only.    Review of Systems     Cardiac Risk Factors include: advanced age (>55men, >65 women);diabetes mellitus;dyslipidemia;male gender;hypertension     Objective:    Today's Vitals   There is no height or weight on file to calculate BMI.     10/29/2021   10:42 AM 09/08/2021    8:48 AM 09/07/2021   11:37 AM 05/19/2020    9:00 AM 04/22/2020    2:38 PM 07/04/2014   10:15 AM  Advanced Directives  Does Patient Have a Medical Advance Directive? No No No No No No  Would patient like information on creating a medical advance directive? No - Patient declined No - Patient declined No - Patient declined  No - Patient declined     Current Medications (verified) Outpatient Encounter Medications as of 10/29/2021  Medication Sig   Accu-Chek Softclix Lancets lancets 1 each by Other route 2 (two) times daily. E11.9   acetaminophen (TYLENOL) 500 MG tablet Take 1,000-1,500 mg by mouth every 8 (eight) hours as needed for moderate  pain or headache.   aspirin 81 MG chewable tablet Chew 1 tablet (81 mg total) by mouth daily.   Blood Glucose Calibration (ACCU-CHEK AVIVA) SOLN 1 each by Other route as needed (Use to calibrate glucometer). E11.9   Blood Glucose Monitoring Suppl (TRUE METRIX METER) w/Device KIT 1 each by Does not apply route 2 (two) times daily.   carvedilol (COREG) 3.125 MG tablet TAKE 1 TABLET TWICE DAILY WITH MEALS. (PLEASE SCHEDULE AN APPOINTMENT FOR FUTURE REFILLS.)   Dulaglutide (TRULICITY) 3 MG/0.5ML SOPN Inject 3 mg as directed once a week.   glucose blood (TRUE METRIX PRO BLOOD GLUCOSE) test strip Use as instructed   insulin NPH-regular Human (70-30) 100 UNIT/ML injection Inject 20 Units into the skin daily with breakfast.   loperamide (IMODIUM A-D) 2 MG tablet Take 4 mg by mouth 4 (four) times daily as needed for diarrhea or loose stools.   nitroGLYCERIN (NITROSTAT) 0.4 MG SL tablet Place 1 tablet (0.4 mg total) under the tongue every 5 (five) minutes x 3 doses as needed for chest pain.   oxyCODONE (OXY IR/ROXICODONE) 5 MG immediate release tablet Take 1 tablet (5 mg total) by mouth every 4 (four) hours as needed for severe pain.   Simethicone (GAS-X PO) Take 1 tablet by mouth as needed (gas).   tamsulosin (FLOMAX) 0.4 MG CAPS capsule Take 1 capsule (0.4 mg total) by mouth daily.   atorvastatin (LIPITOR) 80 MG tablet TAKE 1 TABLET  DAILY AT 6 PM. (Patient not taking: Reported on 10/29/2021)   baclofen (LIORESAL) 10 MG   tablet Take 1 tablet (10 mg total) by mouth 3 (three) times daily. (Patient not taking: Reported on 10/29/2021)   donepezil (ARICEPT) 5 MG tablet Take 1 tablet (5 mg total) by mouth at bedtime. (Patient not taking: Reported on 10/29/2021)   lisinopril (ZESTRIL) 10 MG tablet TAKE 1 TABLET EVERY DAY (Patient not taking: Reported on 10/29/2021)   vitamin B-12 (CYANOCOBALAMIN) 1000 MCG tablet Take 1,000 mcg by mouth daily. (Patient not taking: Reported on 10/29/2021)   No facility-administered  encounter medications on file as of 10/29/2021.    Allergies (verified) Metformin and related and Ozempic (0.25 or 0.5 mg-dose) [semaglutide(0.25 or 0.5mg-dos)]   History: Past Medical History:  Diagnosis Date   Arthritis    CAD (coronary artery disease)    a. 06/2014 Lateral STEMI/PCI: LM nl, LAD 90d, small, D1 95 (PTCA only), LCX 90p (3.5 x 16 Promus DES), RCA dominant, 20m, RPDA 50m.   CKD (chronic kidney disease), stage III (HCC)    Diabetes mellitus    Headache    History of kidney stones    Hyperlipidemia    Hypertension    Rosacea    Sciatica    Seizures (HCC)    h/o - none in years   Past Surgical History:  Procedure Laterality Date   CRANIOTOMY  06/13/1946   left craniectomy after head trauma   CYSTOSCOPY     CYSTOSCOPY WITH URETHRAL DILATATION N/A 09/08/2021   Procedure: CYSTOSCOPY WITH OPTILUM URETHRAL DILATATION;  Surgeon: Bell, Eugene D III, MD;  Location: [facility];  Service: Urology;  Laterality: N/A;  45 MINS FOR CASE   LEFT HEART CATHETERIZATION WITH CORONARY ANGIOGRAM N/A 07/04/2014   Procedure: LEFT HEART CATHETERIZATION WITH CORONARY ANGIOGRAM;  Surgeon: Peter M Jordan, MD;  Location: [facility];  Service: Cardiovascular;  Laterality: N/A;   MULTIPLE TOOTH EXTRACTIONS     PERCUTANEOUS CORONARY STENT INTERVENTION (PCI-S)  07/04/2014   Procedure: PERCUTANEOUS CORONARY STENT INTERVENTION (PCI-S);  Surgeon: Peter M Jordan, MD;  Location: [facility];  Service: Cardiovascular;;  prox CFX   RIB FRACTURE SURGERY  06/13/1974   ORIF after frcture due to trauma   right leg sur     Family History  Problem Relation Age of Onset   Heart disease Father        CAD - died @ 75 of MI   Heart disease Mother        CAD   Heart disease Brother        CAD - died @ 75 of MI   Heart disease Brother        CAD   Cancer Neg Hx    Diabetes Neg Hx    COPD Neg Hx    Social History   Socioeconomic History   Marital status: Married    Spouse name: Not on file   Number of  children: 3   Years of education: 8   Highest education level: Not on file  Occupational History   Occupation: truck driver    Comment: retired  Tobacco Use   Smoking status: Never   Smokeless tobacco: Never  Vaping Use   Vaping Use: Never used  Substance and Sexual Activity   Alcohol use: No   Drug use: No   Sexual activity: Never  Other Topics Concern   Not on file  Social History Narrative   Finished 8th grade. Married '64. 3 dtrs. 5 Grandchildren. Work - retired truck driver. Lives in [place] with wife. Left handed     Drinks caffeine   One story home   Social Determinants of Health   Financial Resource Strain: Low Risk    Difficulty of Paying Living Expenses: Not hard at all  Food Insecurity: No Food Insecurity   Worried About Running Out of Food in the Last Year: Never true   Ran Out of Food in the Last Year: Never true  Transportation Needs: No Transportation Needs   Lack of Transportation (Medical): No   Lack of Transportation (Non-Medical): No  Physical Activity: Insufficiently Active   Days of Exercise per Week: 2 days   Minutes of Exercise per Session: 20 min  Stress: No Stress Concern Present   Feeling of Stress : Not at all  Social Connections: Moderately Isolated   Frequency of Communication with Friends and Family: Twice a week   Frequency of Social Gatherings with Friends and Family: Twice a week   Attends Religious Services: Never   Active Member of Clubs or Organizations: No   Attends Club or Organization Meetings: Never   Marital Status: Married    Tobacco Counseling Counseling given: Not Answered   Clinical Intake:  Pre-visit preparation completed: Yes  Pain : No/denies pain     Nutritional Risks: None Diabetes: No  How often do you need to have someone help you when you read instructions, pamphlets, or other written materials from your doctor or pharmacy?: 2 - Rarely What is the last grade level you completed in school?: 8 th  grade  Diabetic?yes Nutrition Risk Assessment:  Has the patient had any N/V/D within the last 2 months?  No  Does the patient have any non-healing wounds?  No  Has the patient had any unintentional weight loss or weight gain?  No   Diabetes:  Is the patient diabetic?  Yes  If diabetic, was a CBG obtained today?  No  Did the patient bring in their glucometer from home?  No  How often do you monitor your CBG's? 3 x day .   Financial Strains and Diabetes Management:  Are you having any financial strains with the device, your supplies or your medication? No .  Does the patient want to be seen by Chronic Care Management for management of their diabetes?  No  Would the patient like to be referred to a Nutritionist or for Diabetic Management?  No   Diabetic Exams:  Diabetic Eye Exam: Completed 12/2020 Diabetic Foot Exam: Overdue, Pt has been advised about the importance in completing this exam. Pt is scheduled for diabetic foot exam on next office visit .   Interpreter Needed?: No  Information entered by :: L.,LPN   Activities of Daily Living    10/29/2021   10:45 AM 09/07/2021   11:39 AM  In your present state of health, do you have any difficulty performing the following activities:  Hearing? 0   Vision? 0   Difficulty concentrating or making decisions? 0   Walking or climbing stairs? 0   Dressing or bathing? 0   Doing errands, shopping? 0 0  Preparing Food and eating ? N   Using the Toilet? N   In the past six months, have you accidently leaked urine? N   Do you have problems with loss of bowel control? N   Managing your Medications? N   Managing your Finances? N   Housekeeping or managing your Housekeeping? N     Patient Care Team: Crawford, Elizabeth A, MD as PCP - General (Internal Medicine)  Indicate any recent Medical Services   you may have received from other than [facility] providers in the past year (date may be approximate).     Assessment:   This is a  routine wellness examination for Scott Wilkins.  Hearing/Vision screen Vision Screening - Comments:: Annual eye exams wears glasses   Dietary issues and exercise activities discussed: Current Exercise Habits: Home exercise routine, Type of exercise: walking, Time (Minutes): 20, Frequency (Times/Week): 2, Weekly Exercise (Minutes/Week): 40, Intensity: Mild, Exercise limited by: cardiac condition(s)   Goals Addressed   None    Depression Screen    10/29/2021   10:43 AM 10/29/2021   10:41 AM 04/27/2021    1:08 PM  PHQ 2/9 Scores  PHQ - 2 Score 0 0 0    Fall Risk    10/29/2021   10:43 AM 09/02/2021   10:18 AM 05/19/2020    9:00 AM  Fall Risk   Falls in the past year? 0 0 1  Number falls in past yr: 0 0 0  Injury with Fall? 0 0 0  Follow up Falls evaluation completed      FALL RISK PREVENTION PERTAINING TO THE HOME:  Any stairs in or around the home? No  If so, are there any without handrails? No  Home free of loose throw rugs in walkways, pet beds, electrical cords, etc? Yes  Adequate lighting in your home to reduce risk of falls? Yes   ASSISTIVE DEVICES UTILIZED TO PREVENT FALLS:  Life alert? No  Use of a cane, walker or w/c? No  Grab bars in the bathroom? Yes  Shower chair or bench in shower? Yes  Elevated toilet seat or a handicapped toilet? Yes    Cognitive Function:    Normal cognitive status assessed by telephone conversation  by this Nurse Health Advisor. No abnormalities found.      Immunizations Immunization History  Administered Date(s) Administered   PFIZER(Purple Top)SARS-COV-2 Vaccination 09/12/2019, 10/07/2019   Pneumococcal Polysaccharide-23 02/29/2012   Td 06/13/2010    TDAP status: Due, Education has been provided regarding the importance of this vaccine. Advised may receive this vaccine at local pharmacy or Health Dept. Aware to provide a copy of the vaccination record if obtained from local pharmacy or Health Dept. Verbalized acceptance and  understanding.  Flu Vaccine status: Declined, Education has been provided regarding the importance of this vaccine but patient still declined. Advised may receive this vaccine at local pharmacy or Health Dept. Aware to provide a copy of the vaccination record if obtained from local pharmacy or Health Dept. Verbalized acceptance and understanding.  Pneumococcal vaccine status: Up to date  Covid-19 vaccine status: Completed vaccines  Qualifies for Shingles Vaccine? Yes   Zostavax completed No   Shingrix Completed?: No.    Education has been provided regarding the importance of this vaccine. Patient has been advised to call insurance company to determine out of pocket expense if they have not yet received this vaccine. Advised may also receive vaccine at local pharmacy or Health Dept. Verbalized acceptance and understanding.  Screening Tests Health Maintenance  Topic Date Due   Hepatitis C Screening  Never done   Zoster Vaccines- Shingrix (1 of 2) Never done   Pneumonia Vaccine 53+ Years old (2 - PCV) 02/28/2013   COVID-19 Vaccine (3 - Booster for Pfizer series) 12/02/2019   INFLUENZA VACCINE  01/11/2022   TETANUS/TDAP  02/28/2022   FOOT EXAM  03/05/2022   HEMOGLOBIN A1C  03/10/2022   OPHTHALMOLOGY EXAM  03/31/2022   HPV VACCINES  Aged Out  COLONOSCOPY (Pts 45-49yrs Insurance coverage will need to be confirmed)  Discontinued    Health Maintenance  Health Maintenance Due  Topic Date Due   Hepatitis C Screening  Never done   Zoster Vaccines- Shingrix (1 of 2) Never done   Pneumonia Vaccine 65+ Years old (2 - PCV) 02/28/2013   COVID-19 Vaccine (3 - Booster for Pfizer series) 12/02/2019    Colorectal cancer screening: No longer required.   Lung Cancer Screening: (Low Dose CT Chest recommended if Age 55-80 years, 30 pack-year currently smoking OR have quit w/in 15years.) does not qualify.   Lung Cancer Screening Referral: n/a  Additional Screening:  Hepatitis C Screening: does  not qualify;   Vision Screening: Recommended annual ophthalmology exams for early detection of glaucoma and other disorders of the eye. Is the patient up to date with their annual eye exam?  Yes  Who is the provider or what is the name of the office in which the patient attends annual eye exams? Dr.McCuen  If pt is not established with a provider, would they like to be referred to a provider to establish care? No .   Dental Screening: Recommended annual dental exams for proper oral hygiene  Community Resource Referral / Chronic Care Management: CRR required this visit?  No   CCM required this visit?  No      Plan:     I have personally reviewed and noted the following in the patient's chart:   Medical and social history Use of alcohol, tobacco or illicit drugs  Current medications and supplements including opioid prescriptions. Patient is currently taking opioid prescriptions. Information provided to patient regarding non-opioid alternatives. Patient advised to discuss non-opioid treatment plan with their provider. Functional ability and status Nutritional status Physical activity Advanced directives List of other physicians Hospitalizations, surgeries, and ER visits in previous 12 months Vitals Screenings to include cognitive, depression, and falls Referrals and appointments  In addition, I have reviewed and discussed with patient certain preventive protocols, quality metrics, and best practice recommendations. A written personalized care plan for preventive services as well as general preventive health recommendations were provided to patient.      , LPN   10/29/2021   Nurse Notes: none       

## 2021-10-29 NOTE — Patient Instructions (Signed)
Scott Wilkins , Thank you for taking time to come for your Medicare Wellness Visit. I appreciate your ongoing commitment to your health goals. Please review the following plan we discussed and let me know if I can assist you in the future.   Screening recommendations/referrals: Colonoscopy: no longer required  Recommended yearly ophthalmology/optometry visit for glaucoma screening and checkup Recommended yearly dental visit for hygiene and checkup  Vaccinations: Influenza vaccine: declined  Pneumococcal vaccine: completed  Tdap vaccine: due  Shingles vaccine: will consider     Advanced directives: none   Conditions/risks identified: none   Next appointment: none   Preventive Care 65 Years and Older, Male Preventive care refers to lifestyle choices and visits with your health care provider that can promote health and wellness. What does preventive care include? A yearly physical exam. This is also called an annual well check. Dental exams once or twice a year. Routine eye exams. Ask your health care provider how often you should have your eyes checked. Personal lifestyle choices, including: Daily care of your teeth and gums. Regular physical activity. Eating a healthy diet. Avoiding tobacco and drug use. Limiting alcohol use. Practicing safe sex. Taking low doses of aspirin every day. Taking vitamin and mineral supplements as recommended by your health care provider. What happens during an annual well check? The services and screenings done by your health care provider during your annual well check will depend on your age, overall health, lifestyle risk factors, and family history of disease. Counseling  Your health care provider may ask you questions about your: Alcohol use. Tobacco use. Drug use. Emotional well-being. Home and relationship well-being. Sexual activity. Eating habits. History of falls. Memory and ability to understand (cognition). Work and work  Astronomer. Screening  You may have the following tests or measurements: Height, weight, and BMI. Blood pressure. Lipid and cholesterol levels. These may be checked every 5 years, or more frequently if you are over 59 years old. Skin check. Lung cancer screening. You may have this screening every year starting at age 32 if you have a 30-pack-year history of smoking and currently smoke or have quit within the past 15 years. Fecal occult blood test (FOBT) of the stool. You may have this test every year starting at age 56. Flexible sigmoidoscopy or colonoscopy. You may have a sigmoidoscopy every 5 years or a colonoscopy every 10 years starting at age 16. Prostate cancer screening. Recommendations will vary depending on your family history and other risks. Hepatitis C blood test. Hepatitis B blood test. Sexually transmitted disease (STD) testing. Diabetes screening. This is done by checking your blood sugar (glucose) after you have not eaten for a while (fasting). You may have this done every 1-3 years. Abdominal aortic aneurysm (AAA) screening. You may need this if you are a current or former smoker. Osteoporosis. You may be screened starting at age 61 if you are at high risk. Talk with your health care provider about your test results, treatment options, and if necessary, the need for more tests. Vaccines  Your health care provider may recommend certain vaccines, such as: Influenza vaccine. This is recommended every year. Tetanus, diphtheria, and acellular pertussis (Tdap, Td) vaccine. You may need a Td booster every 10 years. Zoster vaccine. You may need this after age 36. Pneumococcal 13-valent conjugate (PCV13) vaccine. One dose is recommended after age 63. Pneumococcal polysaccharide (PPSV23) vaccine. One dose is recommended after age 41. Talk to your health care provider about which screenings and vaccines you need  and how often you need them. This information is not intended to replace  advice given to you by your health care provider. Make sure you discuss any questions you have with your health care provider. Document Released: 06/26/2015 Document Revised: 02/17/2016 Document Reviewed: 03/31/2015 Elsevier Interactive Patient Education  2017 [place] Prevention in the Home Falls can cause injuries. They can happen to people of all ages. There are many things you can do to make your home safe and to help prevent falls. What can I do on the outside of my home? Regularly fix the edges of walkways and driveways and fix any cracks. Remove anything that might make you trip as you walk through a door, such as a raised step or threshold. Trim any bushes or trees on the path to your home. Use bright outdoor lighting. Clear any walking paths of anything that might make someone trip, such as rocks or tools. Regularly check to see if handrails are loose or broken. Make sure that both sides of any steps have handrails. Any raised decks and porches should have guardrails on the edges. Have any leaves, snow, or ice cleared regularly. Use sand or salt on walking paths during winter. Clean up any spills in your garage right away. This includes oil or grease spills. What can I do in the bathroom? Use night lights. Install grab bars by the toilet and in the tub and shower. Do not use towel bars as grab bars. Use non-skid mats or decals in the tub or shower. If you need to sit down in the shower, use a plastic, non-slip stool. Keep the floor dry. Clean up any water that spills on the floor as soon as it happens. Remove soap buildup in the tub or shower regularly. Attach bath mats securely with double-sided non-slip rug tape. Do not have throw rugs and other things on the floor that can make you trip. What can I do in the bedroom? Use night lights. Make sure that you have a light by your bed that is easy to reach. Do not use any sheets or blankets that are too big for your bed.  They should not hang down onto the floor. Have a firm chair that has side arms. You can use this for support while you get dressed. Do not have throw rugs and other things on the floor that can make you trip. What can I do in the kitchen? Clean up any spills right away. Avoid walking on wet floors. Keep items that you use a lot in easy-to-reach places. If you need to reach something above you, use a strong step stool that has a grab bar. Keep electrical cords out of the way. Do not use floor polish or wax that makes floors slippery. If you must use wax, use non-skid floor wax. Do not have throw rugs and other things on the floor that can make you trip. What can I do with my stairs? Do not leave any items on the stairs. Make sure that there are handrails on both sides of the stairs and use them. Fix handrails that are broken or loose. Make sure that handrails are as long as the stairways. Check any carpeting to make sure that it is firmly attached to the stairs. Fix any carpet that is loose or worn. Avoid having throw rugs at the top or bottom of the stairs. If you do have throw rugs, attach them to the floor with carpet tape. Make sure that you  have a light switch at the top of the stairs and the bottom of the stairs. If you do not have them, ask someone to add them for you. What else can I do to help prevent falls? Wear shoes that: Do not have high heels. Have rubber bottoms. Are comfortable and fit you well. Are closed at the toe. Do not wear sandals. If you use a stepladder: Make sure that it is fully opened. Do not climb a closed stepladder. Make sure that both sides of the stepladder are locked into place. Ask someone to hold it for you, if possible. Clearly mark and make sure that you can see: Any grab bars or handrails. First and last steps. Where the edge of each step is. Use tools that help you move around (mobility aids) if they are needed. These  include: Canes. Walkers. Scooters. Crutches. Turn on the lights when you go into a dark area. Replace any light bulbs as soon as they burn out. Set up your furniture so you have a clear path. Avoid moving your furniture around. If any of your floors are uneven, fix them. If there are any pets around you, be aware of where they are. Review your medicines with your doctor. Some medicines can make you feel dizzy. This can increase your chance of falling. Ask your doctor what other things that you can do to help prevent falls. This information is not intended to replace advice given to you by your health care provider. Make sure you discuss any questions you have with your health care provider. Document Released: 03/26/2009 Document Revised: 11/05/2015 Document Reviewed: 07/04/2014 Elsevier Interactive Patient Education  2017 [employer].

## 2021-11-01 DIAGNOSIS — R3916 Straining to void: Secondary | ICD-10-CM | POA: Diagnosis not present

## 2021-11-01 DIAGNOSIS — N35013 Post-traumatic anterior urethral stricture: Secondary | ICD-10-CM | POA: Diagnosis not present

## 2021-11-01 DIAGNOSIS — N401 Enlarged prostate with lower urinary tract symptoms: Secondary | ICD-10-CM | POA: Diagnosis not present

## 2021-11-02 ENCOUNTER — Other Ambulatory Visit: Payer: Self-pay

## 2021-11-02 DIAGNOSIS — E119 Type 2 diabetes mellitus without complications: Secondary | ICD-10-CM

## 2021-11-02 DIAGNOSIS — M5451 Vertebrogenic low back pain: Secondary | ICD-10-CM | POA: Diagnosis not present

## 2021-11-02 MED ORDER — TRULICITY 3 MG/0.5ML ~~LOC~~ SOAJ: 3.0000 mg | SUBCUTANEOUS | 3 refills | Status: AC

## 2021-11-09 DIAGNOSIS — M5416 Radiculopathy, lumbar region: Secondary | ICD-10-CM | POA: Diagnosis not present

## 2021-11-24 DIAGNOSIS — M5451 Vertebrogenic low back pain: Secondary | ICD-10-CM | POA: Diagnosis not present

## 2021-12-20 DIAGNOSIS — M5451 Vertebrogenic low back pain: Secondary | ICD-10-CM | POA: Diagnosis not present

## 2021-12-27 DIAGNOSIS — M5451 Vertebrogenic low back pain: Secondary | ICD-10-CM | POA: Diagnosis not present

## 2021-12-30 DIAGNOSIS — M5451 Vertebrogenic low back pain: Secondary | ICD-10-CM | POA: Diagnosis not present

## 2022-02-28 DIAGNOSIS — I252 Old myocardial infarction: Secondary | ICD-10-CM | POA: Diagnosis not present

## 2022-02-28 DIAGNOSIS — E1159 Type 2 diabetes mellitus with other circulatory complications: Secondary | ICD-10-CM | POA: Diagnosis not present

## 2022-02-28 DIAGNOSIS — Z794 Long term (current) use of insulin: Secondary | ICD-10-CM | POA: Diagnosis not present

## 2022-02-28 DIAGNOSIS — Z7985 Long-term (current) use of injectable non-insulin antidiabetic drugs: Secondary | ICD-10-CM | POA: Diagnosis not present

## 2022-02-28 DIAGNOSIS — N183 Chronic kidney disease, stage 3 unspecified: Secondary | ICD-10-CM | POA: Diagnosis not present

## 2022-02-28 DIAGNOSIS — E1122 Type 2 diabetes mellitus with diabetic chronic kidney disease: Secondary | ICD-10-CM | POA: Diagnosis not present

## 2022-05-26 ENCOUNTER — Other Ambulatory Visit: Payer: Self-pay | Admitting: Cardiology

## 2022-06-15 ENCOUNTER — Other Ambulatory Visit (HOSPITAL_COMMUNITY)
Admission: RE | Admit: 2022-06-15 | Discharge: 2022-06-15 | Disposition: A | Discharge status: Home / Self Care | Payer: Medicare HMO | Source: Ambulatory Visit | Attending: Ophthalmology | Admitting: Ophthalmology

## 2022-06-15 DIAGNOSIS — H468 Other optic neuritis: Secondary | ICD-10-CM | POA: Insufficient documentation

## 2022-06-15 DIAGNOSIS — H4710 Unspecified papilledema: Secondary | ICD-10-CM | POA: Diagnosis not present

## 2022-06-15 LAB — SEDIMENTATION RATE: Sed Rate: 6 mm/hr (ref 0–16)

## 2022-06-15 LAB — C-REACTIVE PROTEIN: CRP: 0.6 mg/dL (ref <1.0)

## 2022-06-16 DIAGNOSIS — I889 Nonspecific lymphadenitis, unspecified: Secondary | ICD-10-CM | POA: Diagnosis not present

## 2022-06-17 DIAGNOSIS — H468 Other optic neuritis: Secondary | ICD-10-CM | POA: Diagnosis not present

## 2022-06-18 ENCOUNTER — Other Ambulatory Visit: Payer: Self-pay | Admitting: Cardiology

## 2022-06-23 ENCOUNTER — Other Ambulatory Visit: Payer: Self-pay | Admitting: Ophthalmology

## 2022-06-23 DIAGNOSIS — H4720 Unspecified optic atrophy: Secondary | ICD-10-CM | POA: Diagnosis not present

## 2022-06-23 DIAGNOSIS — H4710 Unspecified papilledema: Secondary | ICD-10-CM

## 2022-07-05 ENCOUNTER — Ambulatory Visit
Admission: RE | Admit: 2022-07-05 | Discharge: 2022-07-05 | Disposition: A | Discharge status: Home / Self Care | Payer: Medicare HMO | Source: Ambulatory Visit | Attending: Ophthalmology | Admitting: Ophthalmology

## 2022-07-05 DIAGNOSIS — H4710 Unspecified papilledema: Secondary | ICD-10-CM | POA: Diagnosis not present

## 2022-07-05 DIAGNOSIS — S0990XA Unspecified injury of head, initial encounter: Secondary | ICD-10-CM | POA: Diagnosis not present

## 2022-07-05 DIAGNOSIS — H538 Other visual disturbances: Secondary | ICD-10-CM | POA: Diagnosis not present

## 2022-07-05 DIAGNOSIS — J3489 Other specified disorders of nose and nasal sinuses: Secondary | ICD-10-CM | POA: Diagnosis not present

## 2022-07-18 ENCOUNTER — Ambulatory Visit (INDEPENDENT_AMBULATORY_CARE_PROVIDER_SITE_OTHER): Payer: Medicare HMO | Admitting: Internal Medicine

## 2022-07-18 ENCOUNTER — Encounter: Payer: Self-pay | Admitting: Internal Medicine

## 2022-07-18 VITALS — BP 120/60 | HR 86 | Temp 97.6°F | Ht 64.0 in | Wt 152.0 lb

## 2022-07-18 DIAGNOSIS — I10 Essential (primary) hypertension: Secondary | ICD-10-CM

## 2022-07-18 DIAGNOSIS — N183 Chronic kidney disease, stage 3 unspecified: Secondary | ICD-10-CM

## 2022-07-18 DIAGNOSIS — I2510 Atherosclerotic heart disease of native coronary artery without angina pectoris: Secondary | ICD-10-CM | POA: Diagnosis not present

## 2022-07-18 DIAGNOSIS — R209 Unspecified disturbances of skin sensation: Secondary | ICD-10-CM

## 2022-07-18 DIAGNOSIS — E1159 Type 2 diabetes mellitus with other circulatory complications: Secondary | ICD-10-CM | POA: Diagnosis not present

## 2022-07-18 DIAGNOSIS — Z7985 Long-term (current) use of injectable non-insulin antidiabetic drugs: Secondary | ICD-10-CM | POA: Diagnosis not present

## 2022-07-18 DIAGNOSIS — Z794 Long term (current) use of insulin: Secondary | ICD-10-CM | POA: Diagnosis not present

## 2022-07-18 DIAGNOSIS — G40909 Epilepsy, unspecified, not intractable, without status epilepticus: Secondary | ICD-10-CM

## 2022-07-18 DIAGNOSIS — E1122 Type 2 diabetes mellitus with diabetic chronic kidney disease: Secondary | ICD-10-CM | POA: Diagnosis not present

## 2022-07-18 DIAGNOSIS — E118 Type 2 diabetes mellitus with unspecified complications: Secondary | ICD-10-CM

## 2022-07-18 NOTE — Progress Notes (Unsigned)
   Subjective:   Patient ID: Scott Wilkins, male    DOB: January 30, 1941, 82 y.o.   MRN: [id number]  HPI The patient is a 82 YO man coming in for feeling cold on left side. Recently eye doctor thought he had a stroke on eye and did imaging which did not find stroke or blood flow issues. This has been present for some time and not different lately but they are concerned about blood flow.  Review of Systems  Constitutional: Negative.   HENT: Negative.    Eyes: Negative.   Respiratory:  Negative for cough, chest tightness and shortness of breath.   Cardiovascular:  Negative for chest pain, palpitations and leg swelling.  Gastrointestinal:  Negative for abdominal distention, abdominal pain, constipation, diarrhea, nausea and vomiting.  Musculoskeletal: Negative.   Skin: Negative.   Neurological: Negative.        Temperature sensation difference left and right  Psychiatric/Behavioral: Negative.      Objective:  Physical Exam Constitutional:      Appearance: He is well-developed.  HENT:     Head: Normocephalic and atraumatic.  Cardiovascular:     Rate and Rhythm: Normal rate and regular rhythm.  Pulmonary:     Effort: Pulmonary effort is normal. No respiratory distress.     Breath sounds: Normal breath sounds. No wheezing or rales.  Abdominal:     General: Bowel sounds are normal. There is no distension.     Palpations: Abdomen is soft.     Tenderness: There is no abdominal tenderness. There is no rebound.  Musculoskeletal:     Cervical back: Normal range of motion.  Skin:    General: Skin is warm and dry.  Neurological:     Mental Status: He is alert and oriented to person, place, and time.     Coordination: Coordination normal.     Comments: Skin temp feels same left to right in feet, legs, arm, face     Vitals:   07/18/22 1603  BP: 120/60  Pulse: 86  Temp: 97.6 F (36.4 C)  TempSrc: Oral  SpO2: 97%  Weight: 152 lb (68.9 kg)  Height: 5\' 4"  (1.626 m)    Assessment &  Plan:

## 2022-07-19 LAB — VITAMIN B12: Vitamin B-12: 246 pg/mL (ref 211–911)

## 2022-07-19 LAB — CBC
HCT: 43.7 % (ref 39.0–52.0)
Hemoglobin: 15.1 g/dL (ref 13.0–17.0)
MCHC: 34.5 g/dL (ref 30.0–36.0)
MCV: 91.3 fl (ref 78.0–100.0)
Platelets: 118 10*3/uL — ABNORMAL LOW (ref 150.0–400.0)
RBC: 4.79 Mil/uL (ref 4.22–5.81)
RDW: 13.4 % (ref 11.5–15.5)
WBC: 5.5 10*3/uL (ref 4.0–10.5)

## 2022-07-19 LAB — COMPREHENSIVE METABOLIC PANEL
ALT: 15 U/L (ref 0–53)
AST: 17 U/L (ref 0–37)
Albumin: 4.6 g/dL (ref 3.5–5.2)
Alkaline Phosphatase: 95 U/L (ref 39–117)
BUN: 33 mg/dL — ABNORMAL HIGH (ref 6–23)
CO2: 25 mEq/L (ref 19–32)
Calcium: 9.4 mg/dL (ref 8.4–10.5)
Chloride: 104 mEq/L (ref 96–112)
Creatinine, Ser: 1.5 mg/dL (ref 0.40–1.50)
GFR: 44.07 mL/min — ABNORMAL LOW (ref >60.00)
Glucose, Bld: 144 mg/dL — ABNORMAL HIGH (ref 70–99)
Potassium: 4.2 mEq/L (ref 3.5–5.1)
Sodium: 142 mEq/L (ref 135–145)
Total Bilirubin: 0.5 mg/dL (ref 0.2–1.2)
Total Protein: 7.5 g/dL (ref 6.0–8.3)

## 2022-07-19 LAB — VITAMIN D 25 HYDROXY (VIT D DEFICIENCY, FRACTURES): VITD: 29.12 ng/mL — ABNORMAL LOW (ref 30.00–100.00)

## 2022-07-19 LAB — TSH: TSH: 2.26 u[IU]/mL (ref 0.35–5.50)

## 2022-07-19 LAB — FERRITIN: Ferritin: 106.6 ng/mL (ref 22.0–322.0)

## 2022-07-19 LAB — MICROALBUMIN / CREATININE URINE RATIO
Creatinine,U: 125.6 mg/dL
Microalb Creat Ratio: 4.6 mg/g (ref 0.0–30.0)
Microalb, Ur: 5.8 mg/dL — ABNORMAL HIGH (ref 0.0–1.9)

## 2022-07-20 DIAGNOSIS — H4710 Unspecified papilledema: Secondary | ICD-10-CM | POA: Diagnosis not present

## 2022-07-21 DIAGNOSIS — R209 Unspecified disturbances of skin sensation: Secondary | ICD-10-CM | POA: Insufficient documentation

## 2022-07-21 NOTE — Assessment & Plan Note (Signed)
Reviewed MRI and prior angiogram head/neck findings with normal blood flow and no significant stenosis. Skin temp is normal and equal bilaterally so suspect this is not a vascular phenomenon. Checking CBC, CMP, TSH, vitamin D and B12 and HgA1c to check for metabolic causes. Treat as appropriate.

## 2022-07-21 NOTE — Assessment & Plan Note (Signed)
No current or recent seizures. Checking CBC and CMP. Not on medications.

## 2022-07-21 NOTE — Assessment & Plan Note (Signed)
Checking HgA1c, B12, CBC, CMP. It is possible that the cold sensation could be variant of peripheral neuropathy so checking. TSH and vitamin D as well for other metabolic causes. Adjust trulicity 3 mg weekly and insulin 70/30 20 units BID and is on ACE-I and statin.

## 2022-07-21 NOTE — Assessment & Plan Note (Signed)
BP at goal checking CMP and CBC. Adjust as needed lisinopril 10 mg daily and coreg 3.125 mg BID as needed.

## 2022-08-17 DIAGNOSIS — H5203 Hypermetropia, bilateral: Secondary | ICD-10-CM | POA: Diagnosis not present

## 2022-08-17 DIAGNOSIS — H353131 Nonexudative age-related macular degeneration, bilateral, early dry stage: Secondary | ICD-10-CM | POA: Diagnosis not present

## 2022-08-17 DIAGNOSIS — E119 Type 2 diabetes mellitus without complications: Secondary | ICD-10-CM | POA: Diagnosis not present

## 2022-08-17 DIAGNOSIS — H4720 Unspecified optic atrophy: Secondary | ICD-10-CM | POA: Diagnosis not present

## 2022-08-17 DIAGNOSIS — H2513 Age-related nuclear cataract, bilateral: Secondary | ICD-10-CM | POA: Diagnosis not present

## 2022-09-02 NOTE — Progress Notes (Unsigned)
NEUROLOGY FOLLOW UP OFFICE NOTE  TABER PAPENFUSS [id number]  Assessment/Plan:   Left sided headache - labs not consistent with giant cell arteritis. 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  We can increase to 25mg  at bedtime in 4 weeks if needed Check MRI of brain without contrast Check bilateral carotid ultrasound Follow up in 5 months. Follow up with PCP regarding blood pressure  Subjective:  Scott Wilkins is a 82 year old left-handed male with type 2 diabetes mellitus, CAD, HTN, CKD, cognitive impairment and remote history of seizures and head trauma status post craniotomy who follows up for headache.  He is accompanied by his wife who supplements history.  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  Sed rate was 3 and TSH was 2.17.  MRA of head on 05/22/2020 personally reviewed was normal and negative for aneurysm.    About a year ago, he started having left sided headaches.  He also had vision loss in the left eye.  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  Labs revealed sed rate 6, negative CRP, and Hgb A1c 7.2.  MRI of orbits without contrast on 07/05/2022 personally reviewed showed stable foci of chronic encephalomalacia within the left frontal, parietal and temporal lobes but no acute findings.  Still has vision loss in the lower half of his visual field in the left eye.  Still with a persistent headache, described as a pressure from the left eye radiating back across the temple to back of head.  Does not radiate down neck.  No associated nausea, photophobia or phonophobia.  Usually 5/10 but does fluctuate in intensity to feeling that his head "is going to explode" once a day.    For a few years, he has a sensation that his left arm and his left leg below the knee to the toes  feels cold.  No associated pain, numbness or weakness.      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  He had subsequent seizures for a few years before they resolved.    He developed a new headache in October 2021, described as a severe left sided persistent headache of gradual onset with nausea.  He also developed binocular horizontal diplopia, slightly skewed.  Symptoms started after he doubled his dose of Ozempic.  After two weeks, he went to the ED on 04/22/2020 for further evaluation.  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   CBC showed stable thrombocytopenia (PLT 114) and renal insufficiency (BUN/Cr 26/1.41).  He was treated with a headache cocktail and discharged with outpatient neurology follow-up.  He followed up with his eye doctor who noted right 6th nerve palsy and was advised to wear a patch on his left eye to strengthen the eye.  Headaches have since improved.   82 years old accident -     PAST MEDICAL HISTORY: Past Medical History:  Diagnosis Date   Arthritis    CAD (coronary artery disease)    a. 06/2014 Lateral STEMI/PCI: LM nl, LAD 90d, small, D1 95 (PTCA only), LCX 90p (3.5 x 16 Promus DES), RCA dominant, 39m, RPDA 68m.  CKD (chronic kidney disease), stage III (HCC)    Diabetes mellitus    Headache    History of kidney stones    Hyperlipidemia    Hypertension    Rosacea    Sciatica    Seizures ([place])    h/o - none in years    MEDICATIONS: Current Outpatient Medications on File Prior to Visit  Medication Sig Dispense Refill   Accu-Chek Softclix Lancets lancets 1 each by Other route 2 (two) times daily. E11.9 200 each 0   acetaminophen (TYLENOL) 500 MG tablet Take 1,000-1,500 mg by mouth every 8 (eight) hours as needed  for moderate pain or headache.     amoxicillin (AMOXIL) 500 MG capsule Take 500 mg by mouth every 8 (eight) hours.     aspirin 81 MG chewable tablet Chew 1 tablet (81 mg total) by mouth daily.     atorvastatin (LIPITOR) 80 MG tablet TAKE 1 TABLET DAILY AT 6 PM. 30 tablet 0   baclofen (LIORESAL) 10 MG tablet Take 1 tablet (10 mg total) by mouth 3 (three) times daily. 60 each 0   Blood Glucose Calibration (ACCU-CHEK AVIVA) SOLN 1 each by Other route as needed (Use to calibrate glucometer). E11.9 1 each 0   Blood Glucose Monitoring Suppl (TRUE METRIX METER) w/Device KIT 1 each by Does not apply route 2 (two) times daily. 1 kit 0   carvedilol (COREG) 3.125 MG tablet TAKE 1 TABLET TWICE DAILY WITH MEALS. (PLEASE SCHEDULE AN APPOINTMENT FOR FUTURE REFILLS.) 180 tablet 1   donepezil (ARICEPT) 5 MG tablet Take 1 tablet (5 mg total) by mouth at bedtime. 90 tablet 3   Dulaglutide (TRULICITY) 3 0000000 SOPN Inject 3 mg as directed once a week. 6 mL 3   glucose blood (TRUE METRIX PRO BLOOD GLUCOSE) test strip Use as instructed 100 each 12   insulin NPH-regular Human (70-30) 100 UNIT/ML injection Inject 20 Units into the skin daily with breakfast. 20 mL 1   lisinopril (ZESTRIL) 10 MG tablet Take 1 tablet (10 mg total) by mouth daily. SCHEDULE OFFICE VISIT FOR FUTURE REFILLS. 15 tablet 0   loperamide (IMODIUM A-D) 2 MG tablet Take 4 mg by mouth 4 (four) times daily as needed for diarrhea or loose stools.     nitroGLYCERIN (NITROSTAT) 0.4 MG SL tablet Place 1 tablet (0.4 mg total) under the tongue every 5 (five) minutes x 3 doses as needed for chest pain. 25 tablet 11   oxyCODONE (OXY IR/ROXICODONE) 5 MG immediate release tablet Take 1 tablet (5 mg total) by mouth every 4 (four) hours as needed for severe pain. 30 tablet 0   Simethicone (GAS-X PO) Take 1 tablet by mouth as needed (gas).     tamsulosin (FLOMAX) 0.4 MG CAPS capsule Take 1 capsule (0.4 mg total) by mouth daily. 30 capsule 3   vitamin B-12  (CYANOCOBALAMIN) 1000 MCG tablet Take 1,000 mcg by mouth daily.     No current facility-administered medications on file prior to visit.    ALLERGIES: Allergies  Allergen Reactions   Metformin And Related     Abdominal pain, diarrhea   Ozempic (0.25 Or 0.5 Mg-Dose) [Semaglutide(0.25 Or 0.5mg -Dos)]     Nausea, headache,blurred vision    FAMILY HISTORY: Family History  Problem Relation Age of Onset   Heart disease Father        CAD - died @ 22 of MI   Heart disease Mother        CAD   Heart disease Brother  CAD - died @ 39 of MI   Heart disease Brother        CAD   Cancer Neg Hx    Diabetes Neg Hx    COPD Neg Hx       Objective:  Blood pressure (!) 166/80, pulse 74, resp. rate 20, height 5\' 4"  (1.626 m), weight 154 lb (69.9 kg), SpO2 95 %. General: No acute distress.  Patient appears well-groomed.   Head:  Normocephalic/atraumatic Eyes:  Fundi examined but not visualized Neck: supple, bilateral paraspinal tenderness, full range of motion Heart:  Regular rate and rhythm Neurological Exam: alert and oriented to person, place, and time.  Speech fluent and not dysarthric, language intact.  CN II-XII intact. Bulk and tone normal, muscle strength 5/5 throughout.  Sensation to light touch intact.  Deep tendon reflexes 2+ throughout, toes downgoing.  Finger to nose testing intact.  Gait normal, Romberg negative.   Metta Clines, DO  CC: Pricilla Holm, MD

## 2022-09-05 ENCOUNTER — Ambulatory Visit: Payer: Medicare HMO | Admitting: Neurology

## 2022-09-05 ENCOUNTER — Encounter: Payer: Self-pay | Admitting: Neurology

## 2022-09-05 VITALS — BP 166/80 | HR 74 | Resp 20 | Ht 64.0 in | Wt 154.0 lb

## 2022-09-05 DIAGNOSIS — R519 Headache, unspecified: Secondary | ICD-10-CM | POA: Diagnosis not present

## 2022-09-05 DIAGNOSIS — H47012 Ischemic optic neuropathy, left eye: Secondary | ICD-10-CM

## 2022-09-05 DIAGNOSIS — I10 Essential (primary) hypertension: Secondary | ICD-10-CM

## 2022-09-05 MED ORDER — NORTRIPTYLINE HCL 10 MG PO CAPS: 10.0000 mg | ORAL_CAPSULE | Freq: Every day | ORAL | 5 refills | Status: DC

## 2022-09-05 NOTE — Patient Instructions (Addendum)
Start nortriptyline 10mg  at bedtime.  If no improvement in 4 weeks, contact me and we can increase dose. MRI of brain without contrast Bilateral carotid ultrasound Take B12 supplement Follow up 5 months. [facility] [phone]

## 2022-09-09 ENCOUNTER — Telehealth: Payer: Self-pay | Admitting: Cardiology

## 2022-09-09 DIAGNOSIS — E785 Hyperlipidemia, unspecified: Secondary | ICD-10-CM

## 2022-09-09 NOTE — Telephone Encounter (Signed)
Pt c/o medication issue:  1. Name of Medication:  atorvastatin (LIPITOR) 80 MG tablet  2. How are you currently taking this medication (dosage and times per day)?   3. Are you having a reaction (difficulty breathing--STAT)?   4. What is your medication issue?   Patient's wife states the patient was taken off Atorvastatin because it was causing bad headaches. She would like to discuss starting him on a new medication.

## 2022-09-09 NOTE — Telephone Encounter (Signed)
Martin[place], Peter M, MD  Fidel Levy, RN Cc: Golden Hurter D, LPN Caller: Unspecified (Today,  2:23 PM) Yes would order lipid panel and CMET prior to visit  Peter Martinique MD, [facility]

## 2022-09-09 NOTE — Telephone Encounter (Signed)
Spoke with patient's wife. She advised that patient stop atorvastatin d/t bad headaches - she read this could be a side effect. He has been off the med for 8-9 months. Wife said she tried to call in about this but there are no notes about this. His last visit [facility] 10/2020.  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   Wife said PCP has checked lipids and told him cholesterol was fine. Informed her that the last time it was checked was 04/2021.   Scheduled patient for OV on 4/19 -- will send message to MD to see if labs can be ordered prior to be reviewed at visit/discuss lipid management

## 2022-09-09 NOTE — Telephone Encounter (Signed)
Patient's wife aware that fasting labs have been ordered. Advised to complete by 4/16.

## 2022-09-20 DIAGNOSIS — E785 Hyperlipidemia, unspecified: Secondary | ICD-10-CM | POA: Diagnosis not present

## 2022-09-21 LAB — COMPREHENSIVE METABOLIC PANEL
ALT: 25 IU/L (ref 0–44)
AST: 26 IU/L (ref 0–40)
Albumin/Globulin Ratio: 1.8 (ref 1.2–2.2)
Albumin: 4.5 g/dL (ref 3.8–4.8)
Alkaline Phosphatase: 132 IU/L — ABNORMAL HIGH (ref 44–121)
BUN/Creatinine Ratio: 17 (ref 10–24)
BUN: 24 mg/dL (ref 8–27)
Bilirubin Total: 0.5 mg/dL (ref 0.0–1.2)
CO2: 24 mmol/L (ref 20–29)
Calcium: 9.5 mg/dL (ref 8.6–10.2)
Chloride: 106 mmol/L (ref 96–106)
Creatinine, Ser: 1.43 mg/dL — ABNORMAL HIGH (ref 0.76–1.27)
Globulin, Total: 2.5 g/dL (ref 1.5–4.5)
Glucose: 186 mg/dL — ABNORMAL HIGH (ref 70–99)
Potassium: 5 mmol/L (ref 3.5–5.2)
Sodium: 145 mmol/L — ABNORMAL HIGH (ref 134–144)
Total Protein: 7 g/dL (ref 6.0–8.5)
eGFR: 50 mL/min/{1.73_m2} — ABNORMAL LOW (ref >59)

## 2022-09-21 LAB — LIPID PANEL
Chol/HDL Ratio: 3.8 ratio (ref 0.0–5.0)
Cholesterol, Total: 218 mg/dL — ABNORMAL HIGH (ref 100–199)
HDL: 58 mg/dL (ref >39)
LDL Chol Calc (NIH): 141 mg/dL — ABNORMAL HIGH (ref 0–99)
Triglycerides: 107 mg/dL (ref 0–149)
VLDL Cholesterol Cal: 19 mg/dL (ref 5–40)

## 2022-09-27 ENCOUNTER — Other Ambulatory Visit: Payer: Self-pay

## 2022-09-29 ENCOUNTER — Ambulatory Visit
Admission: RE | Admit: 2022-09-29 | Discharge: 2022-09-29 | Disposition: A | Discharge status: Home / Self Care | Payer: Medicare HMO | Source: Ambulatory Visit | Attending: Neurology | Admitting: Neurology

## 2022-09-29 DIAGNOSIS — R519 Headache, unspecified: Secondary | ICD-10-CM

## 2022-09-29 DIAGNOSIS — H5462 Unqualified visual loss, left eye, normal vision right eye: Secondary | ICD-10-CM | POA: Diagnosis not present

## 2022-09-29 DIAGNOSIS — H47012 Ischemic optic neuropathy, left eye: Secondary | ICD-10-CM

## 2022-09-29 DIAGNOSIS — I6523 Occlusion and stenosis of bilateral carotid arteries: Secondary | ICD-10-CM | POA: Diagnosis not present

## 2022-09-29 NOTE — Progress Notes (Signed)
Cardiology Office Note   Date:  09/30/2022   ID:  Scott Wilkins, DOB Jan 07, 1941, MRN [id number]  PCP:  Myrlene Broker, MD  Cardiologist:  Dr. [place]    Chief Complaint  Patient presents with   Coronary Artery Disease   Hypertension   Hyperlipidemia      History of Present Illness: Scott Wilkins is a 82 y.o. male who is seen for follow up CAD. Last seen in May 2022.   He was admitted July 04, 2014 with  lateral ST elevation MI. He underwent emergent cath and received a drug-eluting stent Promus Premier to the proximal circumflex. He also had POBA of prox. diagonal 1. He also had severe disease in the distal LAD that was too small for PCI. His EF remained fairly normal 50-55%. He is diabetic.   On follow up today he is seen with his wife. He denies any complaints but his wife notes his memory is poor and in fact he has a lot of problems. He was on lipitor 80 mg but complained of a lot of HA so stopped it and his HA improved. He is very fatigued. 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 Has severe left sided HA. Has seen Dr Everlena Cooper. Carotid dopplers and cranial MRI done yesterday- no report yet. Patient is active mowing grass and walking his dog. He is not sleeping well. Has some intermittent left arm and chest pain.no dyspnea or palpitations. Memory is bad and wife is having to make sure he gets his medication.     Past Medical History:  Diagnosis Date   Arthritis    CAD (coronary artery disease)    a. 06/2014 Lateral STEMI/PCI: LM nl, LAD 90d, small, D1 95 (PTCA only), LCX 90p (3.5 x 16 Promus DES), RCA dominant, 62m, RPDA 48m.   CKD (chronic kidney disease), stage III    Diabetes mellitus    Headache    History of kidney stones    Hyperlipidemia    Hypertension    Rosacea    Sciatica    Seizures    h/o - none in years    Past Surgical History:  Procedure Laterality Date   CRANIOTOMY  06/13/1946   left craniectomy  after head trauma   CYSTOSCOPY     CYSTOSCOPY WITH URETHRAL DILATATION N/A 09/08/2021   Procedure: CYSTOSCOPY WITH OPTILUM URETHRAL DILATATION;  Surgeon: Crista Elliot, MD;  Location: [facility];  Service: Urology;  Laterality: N/A;  45 MINS FOR CASE   LEFT HEART CATHETERIZATION WITH CORONARY ANGIOGRAM N/A 07/04/2014   Procedure: LEFT HEART CATHETERIZATION WITH CORONARY ANGIOGRAM;  Surgeon: Shawndale Kilpatrick M [place], MD;  Location: [facility] CATH LAB;  Service: Cardiovascular;  Laterality: N/A;   MULTIPLE TOOTH EXTRACTIONS     PERCUTANEOUS CORONARY STENT INTERVENTION (PCI-S)  07/04/2014   Procedure: PERCUTANEOUS CORONARY STENT INTERVENTION (PCI-S);  Surgeon: Jessilynn Taft M [place], MD;  Location: [facility] CATH LAB;  Service: Cardiovascular;;  prox CFX   RIB FRACTURE SURGERY  06/13/1974   ORIF after frcture due to trauma   right leg sur       Current Outpatient Medications  Medication Sig Dispense Refill   Accu-Chek Softclix Lancets lancets 1 each by Other route 2 (two) times daily. E11.9 200 each 0   acetaminophen (TYLENOL) 500 MG tablet Take 1,000-1,500 mg by mouth every 8 (eight) hours as needed for moderate pain or headache.     aspirin 81 MG chewable tablet Chew 1  tablet (81 mg total) by mouth daily.     aspirin EC 81 MG tablet Take by mouth.     baclofen (LIORESAL) 10 MG tablet Take 1 tablet (10 mg total) by mouth 3 (three) times daily. 60 each 0   Blood Glucose Calibration (ACCU-CHEK AVIVA) SOLN 1 each by Other route as needed (Use to calibrate glucometer). E11.9 1 each 0   Blood Glucose Monitoring Suppl (TRUE METRIX METER) w/Device KIT 1 each by Does not apply route 2 (two) times daily. 1 kit 0   carvedilol (COREG) 3.125 MG tablet TAKE 1 TABLET TWICE DAILY WITH MEALS. (PLEASE SCHEDULE AN APPOINTMENT FOR FUTURE REFILLS.) 180 tablet 1   Dulaglutide (TRULICITY) 3 MG/0.5ML SOPN Inject 3 mg as directed once a week. 6 mL 3   ezetimibe (ZETIA) 10 MG tablet Take 1 tablet (10 mg total) by mouth daily. 90 tablet 3    gabapentin (NEURONTIN) 100 MG capsule Take by mouth.     glucose blood (TRUE METRIX PRO BLOOD GLUCOSE) test strip Use as instructed 100 each 12   insulin NPH-regular Human (70-30) 100 UNIT/ML injection Inject 20 Units into the skin daily with breakfast. 20 mL 1   lisinopril (ZESTRIL) 10 MG tablet Take 1 tablet (10 mg total) by mouth daily. SCHEDULE OFFICE VISIT FOR FUTURE REFILLS. 15 tablet 0   loperamide (IMODIUM A-D) 2 MG tablet Take 4 mg by mouth 4 (four) times daily as needed for diarrhea or loose stools.     nitroGLYCERIN (NITROSTAT) 0.4 MG SL tablet Place 1 tablet (0.4 mg total) under the tongue every 5 (five) minutes x 3 doses as needed for chest pain. 25 tablet 11   rosuvastatin (CRESTOR) 5 MG tablet Take 1 tablet (5 mg total) by mouth daily. 90 tablet 3   Simethicone (GAS-X PO) Take 1 tablet by mouth as needed (gas).     nortriptyline (PAMELOR) 10 MG capsule Take 1 capsule (10 mg total) by mouth at bedtime. (Patient not taking: Reported on 09/30/2022) 30 capsule 5   No current facility-administered medications for this visit.    Allergies:   Metformin and related and Ozempic (0.25 or 0.5 mg-dose) [semaglutide(0.25 or 0.5mg -dos)]    Social History:  The patient  reports that he has never smoked. He has never used smokeless tobacco. He reports that he does not drink alcohol and does not use drugs.   Family History:  The patient's family history includes Heart disease in his brother, brother, father, and mother.    ROS:  As noted in HPI. All other systems are reviewed and are otherwise normal.   Wt Readings from Last 3 Encounters:  09/30/22 155 lb (70.3 kg)  09/05/22 154 lb (69.9 kg)  07/18/22 152 lb (68.9 kg)     PHYSICAL EXAM: VS:  BP 122/68 (BP Location: Left Arm, Patient Position: Sitting, Cuff Size: Normal)   Pulse 69   Ht 5\' 4"  (1.626 m)   Wt 155 lb (70.3 kg)   SpO2 96%   BMI 26.61 kg/m  , BMI Body mass index is 26.61 kg/m. GENERAL:  Well appearing WM in NAD HEENT:   PERRL, EOMI, sclera are clear. Oropharynx is clear. NECK:  No jugular venous distention, carotid upstroke brisk and symmetric, no bruits, no thyromegaly or adenopathy LUNGS:  Clear to auscultation bilaterally CHEST:  Unremarkable HEART:  RRR,  PMI not displaced or sustained,S1 and S2 within normal limits, no S3, no S4: no clicks, no rubs, no murmurs ABD:  Soft, nontender. BS +,  no masses or bruits. No hepatomegaly, no splenomegaly EXT:  2 + pulses throughout, no edema, no cyanosis no clubbing SKIN:  Warm and dry.  No rashes NEURO:  Alert and oriented x 3. Cranial nerves II through XII intact. PSYCH:  Cognitively intact    Recent Labs: 07/18/2022: Hemoglobin 15.1; Platelets 118.0; TSH 2.26 09/20/2022: ALT 25; BUN 24; Creatinine, Ser 1.43; Potassium 5.0; Sodium 145    Lipid Panel    Component Value Date/Time   CHOL 218 (H) 09/20/2022 0936   TRIG 107 09/20/2022 0936   HDL 58 09/20/2022 0936   CHOLHDL 3.8 09/20/2022 0936   CHOLHDL 2 04/27/2021 1349   VLDL 12.6 04/27/2021 1349   LDLCALC 141 (H) 09/20/2022 0936   LDLDIRECT 126.0 08/22/2008 1610    Labs dated 06/25/15: cholesterol 122, triglycerides 126, HDL 46, LDL 51. BUN 25, creatinine 1.56. CMET otherwise normal  Dated 10/18/16 A1c 7.8% Dated 04/27/17: cholesterol 138, triglycerides 178, HDL 52, LDL 51.  Dated 08/22/17: A1c 8.5%. Dated 05/04/18: cholesterol 138, triglycerides 112, HDL 50, LDL 52. Creatinine 1.77. otherwise CMET normal Dated 12/27/18: A1c 8.6% Dated 05/18/20: cholesterol 128, triglycerides 161, HDL 54, LDL 47, Hgb 14.3. ALT and TSH normal Dated 10/05/20: creatinine. 1.49. A1c 8.8. potassium 4.2   Ecg today shows NSR with rate 69. Normal Ecg. I have personally reviewed and interpreted this study.    ASSESSMENT AND PLAN:  1.  CAD s/p  STEMI of lateral wall with placement of Promus Premier drug-eluting stent to the circumflex and POBA to diagonal 1 in January 2016. He some angina class 2. He is on aspirin, beta blocker.  Recommend stress Myoview to assess ischemic risk since history is not reliable.   2. Hyperlipidemia with target LDL less than 70.LDL 143. Reports intolerance to lipitor. Will try low dose Crestor 5 mg daily and Zetia 10 mg daily. Repeat lab in 3 months.   3. Essential hypertension- BP is  well controlled. Continue lisinopril and Coreg  4  Diabetes mellitus on long term insulin. A1c 7.2%. Management per primary care.   5. CKD stage 3a. Stable.   6. 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 Will check Echo.   7. Left sided HA. Neuro evaluation in process  8. Memory loss.     I will follow up after above studies.

## 2022-09-30 ENCOUNTER — Encounter: Payer: Self-pay | Admitting: Cardiology

## 2022-09-30 ENCOUNTER — Other Ambulatory Visit: Payer: Self-pay

## 2022-09-30 ENCOUNTER — Ambulatory Visit: Payer: Medicare HMO | Attending: Cardiology | Admitting: Cardiology

## 2022-09-30 VITALS — BP 122/68 | HR 69 | Ht 64.0 in | Wt 155.0 lb

## 2022-09-30 DIAGNOSIS — E785 Hyperlipidemia, unspecified: Secondary | ICD-10-CM

## 2022-09-30 DIAGNOSIS — H47012 Ischemic optic neuropathy, left eye: Secondary | ICD-10-CM | POA: Diagnosis not present

## 2022-09-30 DIAGNOSIS — E119 Type 2 diabetes mellitus without complications: Secondary | ICD-10-CM

## 2022-09-30 DIAGNOSIS — I25118 Atherosclerotic heart disease of native coronary artery with other forms of angina pectoris: Secondary | ICD-10-CM

## 2022-09-30 MED ORDER — ROSUVASTATIN CALCIUM 5 MG PO TABS
5.0000 mg | ORAL_TABLET | Freq: Every day | ORAL | 3 refills | Status: DC
Start: 2022-09-30 — End: 2022-12-19

## 2022-09-30 MED ORDER — EZETIMIBE 10 MG PO TABS: 10.0000 mg | ORAL_TABLET | Freq: Every day | ORAL | 3 refills | Status: DC

## 2022-09-30 NOTE — Addendum Note (Signed)
Addended by: Neoma Laming on: 09/30/2022 10:06 AM   Modules accepted: Orders

## 2022-09-30 NOTE — Patient Instructions (Signed)
Medication Instructions:  Start Crestor 5 mg daily Start Zetia 10 mg daily Continue all other medications *If you need a refill on your cardiac medications before your next appointment, please call your pharmacy*   Lab Work: Lipid and liver panels 3 months after taking crestor and zetia lab orders enclosed   Testing/Procedures: Schedule Stress Myoview   Schedule Echo   Follow-Up: At [facility], you and your health needs are our priority.  As part of our continuing mission to provide you with exceptional heart care, we have created designated Provider Care Teams.  These Care Teams include your primary Cardiologist (physician) and Advanced Practice Providers (APPs -  Physician Assistants and Nurse Practitioners) who all work together to provide you with the care you need, when you need it.  We recommend signing up for the patient portal called "MyChart".  Sign up information is provided on this After Visit Summary.  MyChart is used to connect with patients for Virtual Visits (Telemedicine).  Patients are able to view lab/test results, encounter notes, upcoming appointments, etc.  Non-urgent messages can be sent to your provider as well.   To learn more about what you can do with MyChart, go to [email or web address].    Your next appointment:  After test    Provider:  Dr.Jordan

## 2022-10-17 ENCOUNTER — Telehealth: Payer: Self-pay | Admitting: Internal Medicine

## 2022-10-17 NOTE — Telephone Encounter (Signed)
Called patient to schedule Medicare Annual Wellness Visit (AWV). Left message for patient to call back and schedule Medicare Annual Wellness Visit (AWV).  Last date of AWV: 11/08/2021  Please schedule an appointment at any time with [facility].  If any questions, please contact me at [phone].  Thank you ,  Randon Goldsmith Care Guide [facility] AWV TEAM Direct Dial: [phone]

## 2022-10-31 ENCOUNTER — Telehealth (HOSPITAL_COMMUNITY): Payer: Self-pay

## 2022-10-31 NOTE — Telephone Encounter (Signed)
Spoke to the patient's wife. Detailed instructions given. She stated that he would be here for his test. Asked to call back with any questions. S.Lavelle Berland EMTP/CCT

## 2022-11-01 ENCOUNTER — Telehealth (HOSPITAL_COMMUNITY): Payer: Self-pay | Admitting: Cardiology

## 2022-11-01 NOTE — Telephone Encounter (Signed)
Patient cancelled Myoview for reason below:  11/01/2022 11:54 AMBy:BROWN, SHANELL Cancel JXB:[id number] Raynelle Fanning (pt's wife) stated he didn't want to go through having this done.)   Order will be removed from the [facility].

## 2022-11-02 ENCOUNTER — Telehealth (HOSPITAL_BASED_OUTPATIENT_CLINIC_OR_DEPARTMENT_OTHER): Payer: Self-pay | Admitting: *Deleted

## 2022-11-02 NOTE — Telephone Encounter (Signed)
I called patient to see  if he wanted to reschedule the lexiscan myoview he cancelled on 11/09/22.  Patient's wife states he does not wish to reschedule bur wants to keep the echocardiogram appointment.Marland Kitchen

## 2022-11-09 ENCOUNTER — Ambulatory Visit (HOSPITAL_COMMUNITY): Payer: Medicare HMO | Attending: Cardiology

## 2022-11-09 ENCOUNTER — Encounter (HOSPITAL_COMMUNITY): Payer: Medicare HMO

## 2022-12-06 ENCOUNTER — Ambulatory Visit: Payer: Medicare HMO | Admitting: Cardiology

## 2022-12-09 ENCOUNTER — Other Ambulatory Visit: Payer: Self-pay | Admitting: Cardiology

## 2022-12-13 ENCOUNTER — Ambulatory Visit (HOSPITAL_COMMUNITY): Payer: Medicare HMO | Attending: Cardiology

## 2022-12-13 DIAGNOSIS — H47012 Ischemic optic neuropathy, left eye: Secondary | ICD-10-CM

## 2022-12-13 DIAGNOSIS — I25118 Atherosclerotic heart disease of native coronary artery with other forms of angina pectoris: Secondary | ICD-10-CM | POA: Diagnosis not present

## 2022-12-13 DIAGNOSIS — R079 Chest pain, unspecified: Secondary | ICD-10-CM | POA: Diagnosis not present

## 2022-12-13 DIAGNOSIS — E785 Hyperlipidemia, unspecified: Secondary | ICD-10-CM

## 2022-12-13 DIAGNOSIS — E119 Type 2 diabetes mellitus without complications: Secondary | ICD-10-CM

## 2022-12-13 LAB — ECHOCARDIOGRAM COMPLETE
AR max vel: 2.68 cm2
AV Area VTI: 2.62 cm2
AV Area mean vel: 2.52 cm2
AV Mean grad: 6 mmHg
AV Peak grad: 11.3 mmHg
Ao pk vel: 1.68 m/s
Area-P 1/2: 3.54 cm2
S' Lateral: 2.5 cm

## 2022-12-15 NOTE — Progress Notes (Signed)
Cardiology Office Note   Date:  12/19/2022   ID:  Scott Wilkins September 29, 1940, MRN [id number]  PCP:  Myrlene Broker, MD  Cardiologist:  Dr. [place]    Chief Complaint  Patient presents with   Coronary Artery Disease      History of Present Illness: Scott Wilkins is a 82 y.o. male who is seen for follow up CAD. Last seen in May 2022.   He was admitted July 04, 2014 with  lateral ST elevation MI. He underwent emergent cath and received a drug-eluting stent Promus Premier to the proximal circumflex. He also had POBA of prox. diagonal 1. He also had severe disease in the distal LAD that was too small for PCI. His EF remained fairly normal 50-55%. He is diabetic.   On follow up today he is seen with his wife. He denies any complaints but his wife notes his memory is poor.  He was on lipitor 80 mg but complained of a lot of HA so stopped it and his HA improved. He continues to have significant HA on the left. We tried him on low dose Crestor and Zetia but wife was convinced this was causing his HA so stopped. He is doing yard work/mowing. Walks daily. No chest pain. 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 Has severe left sided HA. Has seen Dr Everlena Cooper. Carotid dopplers were unremarkable as was Echo. When I saw him in April he had some chest pain symptoms but this went away.     Past Medical History:  Diagnosis Date   Arthritis    CAD (coronary artery disease)    a. 06/2014 Lateral STEMI/PCI: LM nl, LAD 90d, small, D1 95 (PTCA only), LCX 90p (3.5 x 16 Promus DES), RCA dominant, 85m, RPDA 2m.   CKD (chronic kidney disease), stage III (HCC)    Diabetes mellitus    Headache    History of kidney stones    Hyperlipidemia    Hypertension    Rosacea    Sciatica    Seizures (HCC)    h/o - none in years    Past Surgical History:  Procedure Laterality Date   CRANIOTOMY  06/13/1946   left craniectomy after head trauma   CYSTOSCOPY      CYSTOSCOPY WITH URETHRAL DILATATION N/A 09/08/2021   Procedure: CYSTOSCOPY WITH OPTILUM URETHRAL DILATATION;  Surgeon: Crista Elliot, MD;  Location: [facility];  Service: Urology;  Laterality: N/A;  45 MINS FOR CASE   LEFT HEART CATHETERIZATION WITH CORONARY ANGIOGRAM N/A 07/04/2014   Procedure: LEFT HEART CATHETERIZATION WITH CORONARY ANGIOGRAM;  Surgeon: Eshaan Titzer M [place], MD;  Location[facility] CATH LAB;  Service: Cardiovascular;  Laterality: N/A;   MULTIPLE TOOTH EXTRACTIONS     PERCUTANEOUS CORONARY STENT INTERVENTION (PCI-S)  07/04/2014   Procedure: PERCUTANEOUS CORONARY STENT INTERVENTION (PCI-S);  Surgeon: Panfilo Ketchum M [place], MD;  Location: [facility] CATH LAB;  Service: Cardiovascular;;  prox CFX   RIB FRACTURE SURGERY  06/13/1974   ORIF after frcture due to trauma   right leg sur       Current Outpatient Medications  Medication Sig Dispense Refill   Accu-Chek Softclix Lancets lancets 1 each by Other route 2 (two) times daily. E11.9 200 each 0   acetaminophen (TYLENOL) 500 MG tablet Take 1,000-1,500 mg by mouth every 8 (eight) hours as needed for moderate pain or headache.     aspirin 81 MG chewable tablet Chew 1 tablet (81 mg  total) by mouth daily.     aspirin EC 81 MG tablet Take by mouth.     baclofen (LIORESAL) 10 MG tablet Take 1 tablet (10 mg total) by mouth 3 (three) times daily. 60 each 0   Blood Glucose Calibration (ACCU-CHEK AVIVA) SOLN 1 each by Other route as needed (Use to calibrate glucometer). E11.9 1 each 0   Blood Glucose Monitoring Suppl (TRUE METRIX METER) w/Device KIT 1 each by Does not apply route 2 (two) times daily. 1 kit 0   carvedilol (COREG) 3.125 MG tablet TAKE 1 TABLET TWICE DAILY WITH MEALS. (PLEASE SCHEDULE AN APPOINTMENT FOR FUTURE REFILLS.) 180 tablet 3   Dulaglutide (TRULICITY) 3 MG/0.5ML SOPN Inject 3 mg as directed once a week. 6 mL 3   glucose blood (TRUE METRIX PRO BLOOD GLUCOSE) test strip Use as instructed 100 each 12   insulin NPH-regular Human (70-30) 100  UNIT/ML injection Inject 20 Units into the skin daily with breakfast. 20 mL 1   lisinopril (ZESTRIL) 10 MG tablet Take 1 tablet (10 mg total) by mouth daily. SCHEDULE OFFICE VISIT FOR FUTURE REFILLS. 15 tablet 0   loperamide (IMODIUM A-D) 2 MG tablet Take 4 mg by mouth 4 (four) times daily as needed for diarrhea or loose stools.     nitroGLYCERIN (NITROSTAT) 0.4 MG SL tablet Place 1 tablet (0.4 mg total) under the tongue every 5 (five) minutes x 3 doses as needed for chest pain. 25 tablet 11   Simethicone (GAS-X PO) Take 1 tablet by mouth as needed (gas).     gabapentin (NEURONTIN) 100 MG capsule Take by mouth. (Patient not taking: Reported on 12/19/2022)     rosuvastatin (CRESTOR) 5 MG tablet Take 1 tablet (5 mg total) by mouth daily. 90 tablet 3   No current facility-administered medications for this visit.    Allergies:   Metformin and related and Ozempic (0.25 or 0.5 mg-dose) [semaglutide(0.25 or 0.5mg -dos)]    Social History:  The patient  reports that he has never smoked. He has never used smokeless tobacco. He reports that he does not drink alcohol and does not use drugs.   Family History:  The patient's family history includes Heart disease in his brother, brother, father, and mother.    ROS:  As noted in HPI. All other systems are reviewed and are otherwise normal.   Wt Readings from Last 3 Encounters:  12/19/22 155 lb (70.3 kg)  09/30/22 155 lb (70.3 kg)  09/05/22 154 lb (69.9 kg)     PHYSICAL EXAM: VS:  BP 124/76 (BP Location: Right Arm, Patient Position: Sitting, Cuff Size: Normal)   Pulse 62   Ht 5\' 5"  (1.651 m)   Wt 155 lb (70.3 kg)   SpO2 94%   BMI 25.79 kg/m  , BMI Body mass index is 25.79 kg/m. GENERAL:  Well appearing WM in NAD HEENT:  PERRL, EOMI, sclera are clear. Oropharynx is clear. NECK:  No jugular venous distention, carotid upstroke brisk and symmetric, no bruits, no thyromegaly or adenopathy LUNGS:  Clear to auscultation bilaterally CHEST:   Unremarkable HEART:  RRR,  PMI not displaced or sustained,S1 and S2 within normal limits, no S3, no S4: no clicks, no rubs, no murmurs ABD:  Soft, nontender. BS +, no masses or bruits. No hepatomegaly, no splenomegaly EXT:  2 + pulses throughout, no edema, no cyanosis no clubbing SKIN:  Warm and dry.  No rashes NEURO:  Alert and oriented x 3. Cranial nerves II through XII intact. PSYCH:  Cognitively intact    Recent Labs: 07/18/2022: Hemoglobin 15.1; Platelets 118.0; TSH 2.26 09/20/2022: ALT 25; BUN 24; Creatinine, Ser 1.43; Potassium 5.0; Sodium 145    Lipid Panel    Component Value Date/Time   CHOL 218 (H) 09/20/2022 0936   TRIG 107 09/20/2022 0936   HDL 58 09/20/2022 0936   CHOLHDL 3.8 09/20/2022 0936   CHOLHDL 2 04/27/2021 1349   VLDL 12.6 04/27/2021 1349   LDLCALC 141 (H) 09/20/2022 0936   LDLDIRECT 126.0 08/22/2008 1610    Labs dated 06/25/15: cholesterol 122, triglycerides 126, HDL 46, LDL 51. BUN 25, creatinine 1.56. CMET otherwise normal  Dated 10/18/16 A1c 7.8% Dated 04/27/17: cholesterol 138, triglycerides 178, HDL 52, LDL 51.  Dated 08/22/17: A1c 8.5%. Dated 05/04/18: cholesterol 138, triglycerides 112, HDL 50, LDL 52. Creatinine 1.77. otherwise CMET normal Dated 12/27/18: A1c 8.6% Dated 05/18/20: cholesterol 128, triglycerides 161, HDL 54, LDL 47, Hgb 14.3. ALT and TSH normal Dated 10/05/20: creatinine. 1.49. A1c 8.8. potassium 4.2    Echo 12/13/22: IMPRESSIONS     1. Left ventricular ejection fraction, by estimation, is 60 to 65%. Left  ventricular ejection fraction by 3D volume is 61 %. The left ventricle has  normal function. The left ventricle has no regional wall motion  abnormalities. Left ventricular diastolic   parameters are consistent with Grade I diastolic dysfunction (impaired  relaxation). The average left ventricular global longitudinal strain is  -22.4 %. The global longitudinal strain is normal.   2. Right ventricular systolic function is normal. The  right ventricular  size is normal.   3. The mitral valve is abnormal. No evidence of mitral valve  regurgitation.   4. The aortic valve is abnormal. Aortic valve regurgitation is not  visualized. Aortic valve sclerosis is present, with no evidence of aortic  valve stenosis.   Comparison(s): Unable to view prior study.    ASSESSMENT AND PLAN:  1.  CAD s/p  STEMI of lateral wall with placement of Promus Premier drug-eluting stent to the circumflex and POBA to diagonal 1 in January 2016. No current angina. He is on aspirin, beta blocker.   2. Hyperlipidemia with target LDL less than 70. LDL 141. Reports intolerance to lipitor. Wife is willing to try Crestor 5 mg daily again. If he doesn't tolerate will need to consider Repatha.   3. Essential hypertension- BP is  well controlled. Continue lisinopril and Coreg  4  Diabetes mellitus on long term insulin. Management per primary care.   5. CKD stage 3a. Stable.   6. 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 Echo normal.   7. Left sided HA. Per Neuro   8. Memory loss.     Follow up in 6 months with APP

## 2022-12-19 ENCOUNTER — Encounter: Payer: Self-pay | Admitting: Cardiology

## 2022-12-19 ENCOUNTER — Ambulatory Visit: Payer: Medicare HMO | Attending: Cardiology | Admitting: Cardiology

## 2022-12-19 VITALS — BP 124/76 | HR 62 | Ht 65.0 in | Wt 155.0 lb

## 2022-12-19 DIAGNOSIS — I25118 Atherosclerotic heart disease of native coronary artery with other forms of angina pectoris: Secondary | ICD-10-CM

## 2022-12-19 DIAGNOSIS — H47012 Ischemic optic neuropathy, left eye: Secondary | ICD-10-CM

## 2022-12-19 DIAGNOSIS — E785 Hyperlipidemia, unspecified: Secondary | ICD-10-CM

## 2022-12-19 MED ORDER — ROSUVASTATIN CALCIUM 5 MG PO TABS: 5.0000 mg | ORAL_TABLET | Freq: Every day | ORAL | 3 refills | Status: DC

## 2022-12-19 NOTE — Patient Instructions (Addendum)
Medication Instructions:  Decrease Crestor to 5 mg daily Continue all other medications *If you need a refill on your cardiac medications before your next appointment, please call your pharmacy*   Lab Work: None ordered   Testing/Procedures: None ordered   Follow-Up: At [facility], you and your health needs are our priority.  As part of our continuing mission to provide you with exceptional heart care, we have created designated Provider Care Teams.  These Care Teams include your primary Cardiologist (physician) and Advanced Practice Providers (APPs -  Physician Assistants and Nurse Practitioners) who all work together to provide you with the care you need, when you need it.  We recommend signing up for the patient portal called "MyChart".  Sign up information is provided on this After Visit Summary.  MyChart is used to connect with patients for Virtual Visits (Telemedicine).  Patients are able to view lab/test results, encounter notes, upcoming appointments, etc.  Non-urgent messages can be sent to your provider as well.   To learn more about what you can do with MyChart, go to [email or web address].    Your next appointment:  Call in Sept to schedule Jan appointment     Provider:  Dr.Jordan's PA

## 2023-01-02 DIAGNOSIS — I25118 Atherosclerotic heart disease of native coronary artery with other forms of angina pectoris: Secondary | ICD-10-CM | POA: Diagnosis not present

## 2023-01-02 DIAGNOSIS — E119 Type 2 diabetes mellitus without complications: Secondary | ICD-10-CM | POA: Diagnosis not present

## 2023-01-02 DIAGNOSIS — H47012 Ischemic optic neuropathy, left eye: Secondary | ICD-10-CM | POA: Diagnosis not present

## 2023-01-02 DIAGNOSIS — E785 Hyperlipidemia, unspecified: Secondary | ICD-10-CM | POA: Diagnosis not present

## 2023-01-03 LAB — LIPID PANEL
Chol/HDL Ratio: 2.4 ratio (ref 0.0–5.0)
Cholesterol, Total: 148 mg/dL (ref 100–199)
HDL: 61 mg/dL (ref >39)
LDL Chol Calc (NIH): 67 mg/dL (ref 0–99)
Triglycerides: 110 mg/dL (ref 0–149)
VLDL Cholesterol Cal: 20 mg/dL (ref 5–40)

## 2023-01-03 LAB — HEPATIC FUNCTION PANEL
ALT: 36 IU/L (ref 0–44)
AST: 24 IU/L (ref 0–40)
Albumin: 4.6 g/dL (ref 3.8–4.8)
Alkaline Phosphatase: 144 IU/L — ABNORMAL HIGH (ref 44–121)
Bilirubin Total: 0.6 mg/dL (ref 0.0–1.2)
Bilirubin, Direct: 0.16 mg/dL (ref 0.00–0.40)
Total Protein: 7 g/dL (ref 6.0–8.5)

## 2023-01-16 DIAGNOSIS — N183 Chronic kidney disease, stage 3 unspecified: Secondary | ICD-10-CM | POA: Diagnosis not present

## 2023-01-16 DIAGNOSIS — Z7985 Long-term (current) use of injectable non-insulin antidiabetic drugs: Secondary | ICD-10-CM | POA: Diagnosis not present

## 2023-01-16 DIAGNOSIS — Z794 Long term (current) use of insulin: Secondary | ICD-10-CM | POA: Diagnosis not present

## 2023-01-16 DIAGNOSIS — E1122 Type 2 diabetes mellitus with diabetic chronic kidney disease: Secondary | ICD-10-CM | POA: Diagnosis not present

## 2023-01-16 DIAGNOSIS — Z978 Presence of other specified devices: Secondary | ICD-10-CM | POA: Diagnosis not present

## 2023-01-16 DIAGNOSIS — I2510 Atherosclerotic heart disease of native coronary artery without angina pectoris: Secondary | ICD-10-CM | POA: Diagnosis not present

## 2023-01-16 DIAGNOSIS — E1159 Type 2 diabetes mellitus with other circulatory complications: Secondary | ICD-10-CM | POA: Diagnosis not present

## 2023-01-23 DIAGNOSIS — N183 Chronic kidney disease, stage 3 unspecified: Secondary | ICD-10-CM | POA: Diagnosis not present

## 2023-01-23 DIAGNOSIS — Z794 Long term (current) use of insulin: Secondary | ICD-10-CM | POA: Diagnosis not present

## 2023-01-23 DIAGNOSIS — E1122 Type 2 diabetes mellitus with diabetic chronic kidney disease: Secondary | ICD-10-CM | POA: Diagnosis not present

## 2023-02-07 ENCOUNTER — Ambulatory Visit: Payer: Medicare HMO | Admitting: Neurology

## 2023-02-09 ENCOUNTER — Encounter (HOSPITAL_COMMUNITY): Payer: Self-pay

## 2023-02-09 ENCOUNTER — Other Ambulatory Visit: Payer: Self-pay

## 2023-02-09 ENCOUNTER — Emergency Department (HOSPITAL_COMMUNITY)
Admission: EM | Admit: 2023-02-09 | Discharge: 2023-02-09 | Disposition: A | Discharge status: Home / Self Care | Payer: Medicare HMO | Attending: Emergency Medicine | Admitting: Emergency Medicine

## 2023-02-09 DIAGNOSIS — Z23 Encounter for immunization: Secondary | ICD-10-CM | POA: Diagnosis not present

## 2023-02-09 DIAGNOSIS — Z7982 Long term (current) use of aspirin: Secondary | ICD-10-CM | POA: Insufficient documentation

## 2023-02-09 DIAGNOSIS — W540XXA Bitten by dog, initial encounter: Secondary | ICD-10-CM | POA: Insufficient documentation

## 2023-02-09 DIAGNOSIS — Z794 Long term (current) use of insulin: Secondary | ICD-10-CM | POA: Diagnosis not present

## 2023-02-09 DIAGNOSIS — S41052A Open bite of left shoulder, initial encounter: Secondary | ICD-10-CM | POA: Diagnosis not present

## 2023-02-09 DIAGNOSIS — S41152A Open bite of left upper arm, initial encounter: Secondary | ICD-10-CM | POA: Diagnosis not present

## 2023-02-09 DIAGNOSIS — S21252A Open bite of left back wall of thorax without penetration into thoracic cavity, initial encounter: Secondary | ICD-10-CM | POA: Diagnosis not present

## 2023-02-09 LAB — CBG MONITORING, ED: Glucose-Capillary: 280 mg/dL — ABNORMAL HIGH (ref 70–99)

## 2023-02-09 MED ORDER — AMOXICILLIN-POT CLAVULANATE 875-125 MG PO TABS
1.0000 | ORAL_TABLET | Freq: Once | ORAL | Status: AC
  Administered 2023-02-09: 1 via ORAL
  Filled 2023-02-09: qty 1

## 2023-02-09 MED ORDER — AMOXICILLIN-POT CLAVULANATE 875-125 MG PO TABS: 1.0000 | ORAL_TABLET | Freq: Two times a day (BID) | ORAL | 0 refills | Status: AC

## 2023-02-09 MED ORDER — BACITRACIN ZINC 500 UNIT/GM EX OINT
TOPICAL_OINTMENT | Freq: Two times a day (BID) | CUTANEOUS | Status: DC
  Filled 2023-02-09 (×2): qty 0.9

## 2023-02-09 MED ORDER — TETANUS-DIPHTH-ACELL PERTUSSIS 5-2.5-18.5 LF-MCG/0.5 IM SUSY
0.5000 mL | PREFILLED_SYRINGE | Freq: Once | INTRAMUSCULAR | Status: AC
  Administered 2023-02-09: 0.5 mL via INTRAMUSCULAR
  Filled 2023-02-09: qty 0.5

## 2023-02-09 NOTE — ED Triage Notes (Signed)
Patient reports dog bit to the back left shoulder. Patient does not know vaccination status of dog. Patient does not know when his last tetanus shot was.

## 2023-02-09 NOTE — ED Provider Notes (Signed)
[place] EMERGENCY DEPARTMENT AT [facility] Provider Note   CSN: [id number] Arrival date & time: 02/09/23  1212     History  Chief Complaint  Patient presents with   Animal Bite    Scott Wilkins is a 82 y.o. male.  82 year old male presents for dog bite, was walking his dog this morning and was knocked down by the dog who lives across the street. Last td unknown. No injuries from being knocked to the ground. Concern for wounds to left upper arm and upper back.  Animal control was contacted and the dog is in quarantine.       Home Medications Prior to Admission medications   Medication Sig Start Date End Date Taking? Authorizing Provider  amoxicillin-clavulanate (AUGMENTIN) 875-125 MG tablet Take 1 tablet by mouth every 12 (twelve) hours. 02/09/23  Yes Jeannie Fend, PA-C  Accu-Chek Softclix Lancets lancets 1 each by Other route 2 (two) times daily. E11.9 01/27/20   Romero Belling, MD  acetaminophen (TYLENOL) 500 MG tablet Take 1,000-1,500 mg by mouth every 8 (eight) hours as needed for moderate pain or headache.    [provider]  aspirin 81 MG chewable tablet Chew 1 tablet (81 mg total) by mouth daily. 07/07/14   Allayne Butcher, PA-C  aspirin EC 81 MG tablet Take by mouth. 07/18/22   [provider]  baclofen (LIORESAL) 10 MG tablet Take 1 tablet (10 mg total) by mouth 3 (three) times daily. 09/02/21   Myrlene Broker, MD  Blood Glucose Calibration (ACCU-CHEK AVIVA) SOLN 1 each by Other route as needed (Use to calibrate glucometer). E11.9 02/26/20   Romero Belling, MD  Blood Glucose Monitoring Suppl (TRUE METRIX METER) w/Device KIT 1 each by Does not apply route 2 (two) times daily. 07/01/21   Myrlene Broker, MD  carvedilol (COREG) 3.125 MG tablet TAKE 1 TABLET TWICE DAILY WITH MEALS. (PLEASE SCHEDULE AN APPOINTMENT FOR FUTURE REFILLS.) 12/09/22   [place], Peter M, MD  Dulaglutide (TRULICITY) 3 MG/0.5ML SOPN Inject 3 mg as directed once  a week. 11/02/21   Carlus Pavlov, MD  gabapentin (NEURONTIN) 100 MG capsule Take by mouth. 07/18/22   [provider]  glucose blood (TRUE METRIX PRO BLOOD GLUCOSE) test strip Use as instructed 07/01/21   Myrlene Broker, MD  insulin NPH-regular Human (70-30) 100 UNIT/ML injection Inject 20 Units into the skin daily with breakfast. 10/12/21   Romero Belling, MD  lisinopril (ZESTRIL) 10 MG tablet Take 1 tablet (10 mg total) by mouth daily. SCHEDULE OFFICE VISIT FOR FUTURE REFILLS. 06/20/22   [place], Peter M, MD  loperamide (IMODIUM A-D) 2 MG tablet Take 4 mg by mouth 4 (four) times daily as needed for diarrhea or loose stools.    [provider]  nitroGLYCERIN (NITROSTAT) 0.4 MG SL tablet Place 1 tablet (0.4 mg total) under the tongue every 5 (five) minutes x 3 doses as needed for chest pain. 06/22/17   [place], Peter M, MD  rosuvastatin (CRESTOR) 5 MG tablet Take 1 tablet (5 mg total) by mouth daily. 12/19/22   [place], Peter M, MD  Simethicone (GAS-X PO) Take 1 tablet by mouth as needed (gas).    [provider]      Allergies    Metformin and related and Ozempic (0.25 or 0.5 mg-dose) [semaglutide(0.25 or 0.5mg -dos)]    Review of Systems   Review of Systems Negative except as per HPI Physical Exam Updated Vital Signs BP 135/80 (BP Location:  Right Arm)   Pulse 70   Temp 97.9 F (36.6 C) (Oral)   Resp 18   Ht 5\' 5"  (1.651 m)   Wt 70.3 kg   SpO2 97%   BMI 25.79 kg/m  Physical Exam Vitals and nursing note reviewed.  Constitutional:      General: He is not in acute distress.    Appearance: He is well-developed. He is not diaphoretic.  HENT:     Head: Normocephalic and atraumatic.  Pulmonary:     Effort: Pulmonary effort is normal.  Skin:    General: Skin is warm and dry.     Comments: Superficial wounds to left upper back as noted, do not appreciate any true puncture wounds.  Additional abrasion noted to right upper back.  Neurological:     Mental Status:  He is alert and oriented to person, place, and time.  Psychiatric:        Behavior: Behavior normal.          ED Results / Procedures / Treatments   Labs (all labs ordered are listed, but only abnormal results are displayed) Labs Reviewed  CBG MONITORING, ED - Abnormal; Notable for the following components:      Result Value   Glucose-Capillary 280 (*)    All other components within normal limits    EKG None  Radiology No results found.  Procedures Procedures    Medications Ordered in ED Medications  Tdap (BOOSTRIX) injection 0.5 mL (0.5 mLs Intramuscular Given 02/09/23 1439)  amoxicillin-clavulanate (AUGMENTIN) 875-125 MG per tablet 1 tablet (1 tablet Oral Given 02/09/23 1438)    ED Course/ Medical Decision Making/ A&P                                 Medical Decision Making Risk Prescription drug management.   82 year old male with dog bite to the left shoulder/arm area.  Areas as documented photographed.  Tetanus is updated, provided with Augmentin.  Recommend wound recheck, follow-up with animal control regarding quarantine status of dog.        Final Clinical Impression(s) / ED Diagnoses Final diagnoses:  Dog bite, initial encounter    Rx / DC Orders ED Discharge Orders          Ordered    amoxicillin-clavulanate (AUGMENTIN) 875-125 MG tablet  Every 12 hours        02/09/23 1416              Jeannie Fend, PA-C 02/16/23 0144    Franne Forts, DO 02/21/23 0011

## 2023-02-09 NOTE — Discharge Instructions (Signed)
Follow-up with animal control regarding dogs quarantine.  You may need to start the rabies vaccine series as discussed if the animal becomes ill. Take Augmentin as prescribed. Recommend wound recheck with your primary care provider in 2 days.

## 2023-02-10 ENCOUNTER — Ambulatory Visit (INDEPENDENT_AMBULATORY_CARE_PROVIDER_SITE_OTHER): Payer: Medicare HMO | Admitting: Nurse Practitioner

## 2023-02-10 ENCOUNTER — Ambulatory Visit (INDEPENDENT_AMBULATORY_CARE_PROVIDER_SITE_OTHER): Payer: Medicare HMO

## 2023-02-10 VITALS — BP 128/70 | HR 71 | Temp 97.8°F | Ht 65.0 in | Wt 153.4 lb

## 2023-02-10 DIAGNOSIS — M25461 Effusion, right knee: Secondary | ICD-10-CM | POA: Diagnosis not present

## 2023-02-10 DIAGNOSIS — M25561 Pain in right knee: Secondary | ICD-10-CM | POA: Diagnosis not present

## 2023-02-10 DIAGNOSIS — M25511 Pain in right shoulder: Secondary | ICD-10-CM | POA: Diagnosis not present

## 2023-02-10 DIAGNOSIS — T148XXA Other injury of unspecified body region, initial encounter: Secondary | ICD-10-CM | POA: Insufficient documentation

## 2023-02-10 DIAGNOSIS — S41051A Open bite of right shoulder, initial encounter: Secondary | ICD-10-CM | POA: Diagnosis not present

## 2023-02-10 DIAGNOSIS — S42021A Displaced fracture of shaft of right clavicle, initial encounter for closed fracture: Secondary | ICD-10-CM | POA: Diagnosis not present

## 2023-02-10 NOTE — Patient Instructions (Signed)
500mg  of tylenol every 8 hours as needed

## 2023-02-10 NOTE — Progress Notes (Signed)
Established Patient Office Visit  Subjective   Patient ID: Scott Wilkins, male    DOB: 06/19/1940  Age: 82 y.o. MRN: [id number]  Chief Complaint  Patient presents with   Animal Bite    Patient has for acute visit.  He reports he was bit by a dog yesterday.  Did go to the emergency department at that time.  He has multiple lacerations located on his left flank, back area as well as left buttocks.  No wounds required stitches.  He was prescribed Augmentin and reports also having topical antibacterial ointment that he is applying.  The dog is a pet of a neighbor, patient reports that rabies vaccine was not up-to-date.  Dog is currently under quarantine and being monitored for signs of rabies.  He did not get a rabies vaccine but did have the tetanus shot administered in the emergency department yesterday. He reports that he fell when dog bit him and he hit his right arm/shoulder as well as right knee. He is having moderate-intense pain to these areas and pain is relieved by tylenol.     ROS: see HPI    Objective:     BP 128/70   Pulse 71   Temp 97.8 F (36.6 C) (Temporal)   Ht 5\' 5"  (1.651 m)   Wt 153 lb 6 oz (69.6 kg)   SpO2 98%   BMI 25.52 kg/m    Physical Exam Vitals reviewed.  Constitutional:      Appearance: Normal appearance.  HENT:     Head: Normocephalic and atraumatic.  Cardiovascular:     Rate and Rhythm: Normal rate and regular rhythm.  Pulmonary:     Effort: Pulmonary effort is normal.     Breath sounds: Normal breath sounds.  Musculoskeletal:     Cervical back: Neck supple.  Skin:    General: Skin is warm and dry.          Comments: Red lines indicate areas of multiple lacerations/abrasions No heat to touch, mildly tender to touch, no drainage noted on exam today  Neurological:     Mental Status: He is alert and oriented to person, place, and time.  Psychiatric:        Mood and Affect: Mood normal.        Behavior: Behavior normal.        Thought  Content: Thought content normal.        Judgment: Judgment normal.      No results found for any visits on 02/10/23.    The ASCVD Risk score (Arnett DK, et al., 2019) failed to calculate for the following reasons:   The patient has a prior MI or stroke diagnosis    Assessment & Plan:   Problem List Items Addressed This Visit       Other   Animal bite    Acute Recommend that he continue taking Augmentin as prescribed as well as using topical antibiotic ointment Recommend that he follow ER providers previous recommendations regarding rabies vaccine and/or consult with the health department for further recommendations regarding whether or not he should have rabies vaccine administered. We do not carry this vaccine at this office and he was made aware of this.  We discussed the seriousness and severity of rabies if he were to contract it and he seems to be aware of the risks associated of not being vaccinated. He was educated on signs and symptoms of infection of the wounds and what to do if this were  to occur      Other Visit Diagnoses     Right knee pain, unspecified chronicity    -  Primary   Relevant Orders   DG Knee 1-2 Views Right (Completed)   Acute pain of right shoulder       Relevant Orders   DG Shoulder Right (Completed)       Return if symptoms worsen or fail to improve.    Elenore Paddy, NP

## 2023-02-10 NOTE — Assessment & Plan Note (Addendum)
Acute Recommend that he continue taking Augmentin as prescribed as well as using topical antibiotic ointment Recommend that he follow ER providers previous recommendations regarding rabies vaccine and/or consult with the health department for further recommendations regarding whether or not he should have rabies vaccine administered. We do not carry this vaccine at this office and he was made aware of this.  We discussed the seriousness and severity of rabies if he were to contract it and he seems to be aware of the risks associated of not being vaccinated. He was educated on signs and symptoms of infection of the wounds and what to do if this were to occur

## 2023-02-11 ENCOUNTER — Encounter (HOSPITAL_COMMUNITY): Payer: Self-pay | Admitting: Emergency Medicine

## 2023-02-11 ENCOUNTER — Other Ambulatory Visit: Payer: Self-pay

## 2023-02-11 ENCOUNTER — Emergency Department (HOSPITAL_COMMUNITY)
Admission: EM | Admit: 2023-02-11 | Discharge: 2023-02-11 | Disposition: A | Discharge status: Home / Self Care | Payer: Medicare HMO | Source: Home / Self Care | Attending: Emergency Medicine | Admitting: Emergency Medicine

## 2023-02-11 DIAGNOSIS — Z79899 Other long term (current) drug therapy: Secondary | ICD-10-CM | POA: Insufficient documentation

## 2023-02-11 DIAGNOSIS — S21132A Puncture wound without foreign body of left front wall of thorax without penetration into thoracic cavity, initial encounter: Secondary | ICD-10-CM | POA: Diagnosis not present

## 2023-02-11 DIAGNOSIS — E1122 Type 2 diabetes mellitus with diabetic chronic kidney disease: Secondary | ICD-10-CM | POA: Insufficient documentation

## 2023-02-11 DIAGNOSIS — N189 Chronic kidney disease, unspecified: Secondary | ICD-10-CM | POA: Diagnosis not present

## 2023-02-11 DIAGNOSIS — W540XXA Bitten by dog, initial encounter: Secondary | ICD-10-CM | POA: Diagnosis not present

## 2023-02-11 DIAGNOSIS — Z7982 Long term (current) use of aspirin: Secondary | ICD-10-CM | POA: Insufficient documentation

## 2023-02-11 DIAGNOSIS — Z794 Long term (current) use of insulin: Secondary | ICD-10-CM | POA: Diagnosis not present

## 2023-02-11 DIAGNOSIS — Z2914 Encounter for prophylactic rabies immune globin: Secondary | ICD-10-CM | POA: Diagnosis not present

## 2023-02-11 DIAGNOSIS — Z23 Encounter for immunization: Secondary | ICD-10-CM | POA: Insufficient documentation

## 2023-02-11 DIAGNOSIS — Z203 Contact with and (suspected) exposure to rabies: Secondary | ICD-10-CM | POA: Insufficient documentation

## 2023-02-11 DIAGNOSIS — I129 Hypertensive chronic kidney disease with stage 1 through stage 4 chronic kidney disease, or unspecified chronic kidney disease: Secondary | ICD-10-CM | POA: Insufficient documentation

## 2023-02-11 DIAGNOSIS — S21152A Open bite of left front wall of thorax without penetration into thoracic cavity, initial encounter: Secondary | ICD-10-CM | POA: Diagnosis present

## 2023-02-11 DIAGNOSIS — W540XXD Bitten by dog, subsequent encounter: Secondary | ICD-10-CM

## 2023-02-11 MED ORDER — RABIES IMMUNE GLOBULIN 150 UNIT/ML IM INJ
20.0000 [IU]/kg | INJECTION | Freq: Once | INTRAMUSCULAR | Status: AC
  Administered 2023-02-11: 1425 [IU]
  Filled 2023-02-11: qty 10

## 2023-02-11 MED ORDER — RABIES VACCINE, PCEC IM SUSR
1.0000 mL | Freq: Once | INTRAMUSCULAR | Status: AC
  Administered 2023-02-11: 1 mL via INTRAMUSCULAR
  Filled 2023-02-11: qty 1

## 2023-02-11 NOTE — Discharge Instructions (Signed)
It was a pleasure taking part in your care today.  As we discussed, we provided you with your rabies shot.  Please return on 9/3 to urgent care for second rabies vaccination.  You will need to have a shot on day 3, day 7 and day 14.  Today is technically day 0.  Please return to the ED with any new or worsening signs or symptoms.  Please continue taking prescribed antibiotic as directed.

## 2023-02-11 NOTE — ED Provider Notes (Signed)
[place] EMERGENCY DEPARTMENT AT [facility] Provider Note   CSN: [id number] Arrival date & time: 02/11/23  1156     History  Chief Complaint  Patient presents with   Animal Bite    Scott Wilkins is a 82 y.o. male with medical history of CKD, diabetes, hypertension, sciatica, seizures.  Patient presents to ED for evaluation of dog bite.  Patient states that he was bit 2 days ago by a dog unknown to him.  He reports he discussed dog vaccination status with neighbors he reports the dogs were not vaccinated.  He was seen on 8/29, 2 days ago, and at this time provider and patient decided on observation of the dogs.  The patient returns here today with his wife requesting rabies vaccination.  He denies any fevers, nausea or vomiting at home.  States he has been taking prescribed antibiotics received on 8/29.  Denies any other concerns.  States that they are currently in the process of "suing" the neighbors.   Animal Bite Associated symptoms: no fever        Home Medications Prior to Admission medications   Medication Sig Start Date End Date Taking? Authorizing Provider  Accu-Chek Softclix Lancets lancets 1 each by Other route 2 (two) times daily. E11.9 01/27/20   Romero Belling, MD  acetaminophen (TYLENOL) 500 MG tablet Take 1,000-1,500 mg by mouth every 8 (eight) hours as needed for moderate pain or headache.    [provider]  amoxicillin-clavulanate (AUGMENTIN) 875-125 MG tablet Take 1 tablet by mouth every 12 (twelve) hours. 02/09/23   Jeannie Fend, PA-C  aspirin 81 MG chewable tablet Chew 1 tablet (81 mg total) by mouth daily. 07/07/14   Allayne Butcher, PA-C  aspirin EC 81 MG tablet Take by mouth. 07/18/22   [provider]  baclofen (LIORESAL) 10 MG tablet Take 1 tablet (10 mg total) by mouth 3 (three) times daily. 09/02/21   Myrlene Broker, MD  Blood Glucose Calibration (ACCU-CHEK AVIVA) SOLN 1 each by Other route as needed (Use to  calibrate glucometer). E11.9 02/26/20   Romero Belling, MD  Blood Glucose Monitoring Suppl (TRUE METRIX METER) w/Device KIT 1 each by Does not apply route 2 (two) times daily. 07/01/21   Myrlene Broker, MD  carvedilol (COREG) 3.125 MG tablet TAKE 1 TABLET TWICE DAILY WITH MEALS. (PLEASE SCHEDULE AN APPOINTMENT FOR FUTURE REFILLS.) 12/09/22   [place], Peter M, MD  Dulaglutide (TRULICITY) 3 MG/0.5ML SOPN Inject 3 mg as directed once a week. 11/02/21   Carlus Pavlov, MD  gabapentin (NEURONTIN) 100 MG capsule Take by mouth. 07/18/22   [provider]  glucose blood (TRUE METRIX PRO BLOOD GLUCOSE) test strip Use as instructed 07/01/21   Myrlene Broker, MD  insulin NPH-regular Human (70-30) 100 UNIT/ML injection Inject 20 Units into the skin daily with breakfast. 10/12/21   Romero Belling, MD  lisinopril (ZESTRIL) 10 MG tablet Take 1 tablet (10 mg total) by mouth daily. SCHEDULE OFFICE VISIT FOR FUTURE REFILLS. 06/20/22   [place], Peter M, MD  loperamide (IMODIUM A-D) 2 MG tablet Take 4 mg by mouth 4 (four) times daily as needed for diarrhea or loose stools.    [provider]  nitroGLYCERIN (NITROSTAT) 0.4 MG SL tablet Place 1 tablet (0.4 mg total) under the tongue every 5 (five) minutes x 3 doses as needed for chest pain. 06/22/17   [place], Peter M, MD  rosuvastatin (CRESTOR) 5 MG tablet Take 1 tablet (5 mg  total) by mouth daily. 12/19/22   [place], Peter M, MD  Simethicone (GAS-X PO) Take 1 tablet by mouth as needed (gas).    [provider]      Allergies    Metformin and related and Ozempic (0.25 or 0.5 mg-dose) [semaglutide(0.25 or 0.5mg -dos)]    Review of Systems   Review of Systems  Constitutional:  Negative for fever.  Skin:  Positive for wound.  All other systems reviewed and are negative.   Physical Exam Updated Vital Signs BP (!) 149/84 (BP Location: Right Arm)   Pulse 71   Temp 98.4 F (36.9 C) (Oral)   Resp 16   Wt 69.6 kg   SpO2 98%   BMI 25.53  kg/m  Physical Exam Vitals and nursing note reviewed.  Constitutional:      General: He is not in acute distress.    Appearance: Normal appearance. He is not ill-appearing, toxic-appearing or diaphoretic.  HENT:     Head: Normocephalic and atraumatic.     Nose: Nose normal.     Mouth/Throat:     Mouth: Mucous membranes are moist.     Pharynx: Oropharynx is clear.  Eyes:     Extraocular Movements: Extraocular movements intact.     Conjunctiva/sclera: Conjunctivae normal.     Pupils: Pupils are equal, round, and reactive to light.  Cardiovascular:     Rate and Rhythm: Normal rate and regular rhythm.  Pulmonary:     Effort: Pulmonary effort is normal.     Breath sounds: Normal breath sounds. No wheezing.  Abdominal:     General: Abdomen is flat. Bowel sounds are normal.     Palpations: Abdomen is soft.     Tenderness: There is no abdominal tenderness.  Musculoskeletal:     Cervical back: Normal range of motion and neck supple. No tenderness.  Skin:    General: Skin is warm and dry.     Comments: Wound to posterior left chest wall.  Multiple puncture wounds.  No purulent drainage, slight surrounding erythema.  No fluid collection.  Neurological:     Mental Status: He is alert.     ED Results / Procedures / Treatments   Labs (all labs ordered are listed, but only abnormal results are displayed) Labs Reviewed - No data to display  EKG None  Radiology DG Shoulder Right  Result Date: 02/10/2023 CLINICAL DATA:  Right shoulder pain. Dog bite to right shoulder 1 day ago. EXAM: RIGHT SHOULDER - 2+ VIEW COMPARISON:  Report only from 03/18/2000 right clavicle radiographs describing comminuted fracture of the mid right clavicle with slight overriding of main fracture fragments. FINDINGS: Mild acromioclavicular joint space narrowing and peripheral osteophytosis. Old healed, displaced right clavicle midshaft fracture with approximately 2.3 cm overriding component and the superior aspect  of the lateral fracture component fused to the inferior aspect of the medial fracture component in the overlapping region. Minimal glenohumeral joint space narrowing. No acute fracture or dislocation. The visualized portion of the right lung is unremarkable. IMPRESSION: 1. No acute fracture or dislocation. 2. Old healed, displaced right clavicle fracture with overriding component. Electronically Signed   By: Neita Garnet M.D.   On: 02/10/2023 11:35   DG Knee 1-2 Views Right  Result Date: 02/10/2023 CLINICAL DATA:  Right shoulder and knee pain following a dog bite to the right side of the lead to a fall onto right knee 1 day ago. EXAM: RIGHT KNEE - 1-2 VIEW COMPARISON:  Right knee radiographs 12/18/2020 FINDINGS: Tiny  joint effusion. Mild chronic enthesopathic change at the quadriceps insertion on the patella, unchanged. Mild superior patellar degenerative spurring. No acute fracture or dislocation. IMPRESSION: Tiny joint effusion. No acute fracture or dislocation. Electronically Signed   By: Neita Garnet M.D.   On: 02/10/2023 11:30    Procedures Procedures   Medications Ordered in ED Medications  rabies vaccine (RABAVERT) injection 1 mL (1 mL Intramuscular Given 02/11/23 1346)  rabies immune globulin (HYPERRAB/KEDRAB) injection 1,425 Units (1,425 Units Infiltration Given 02/11/23 1346)    ED Course/ Medical Decision Making/ A&P  Medical Decision Making Risk Prescription drug management.   82 year old male presents to the ED for evaluation.  Please see HPI for further details.  Patient states he is here for rabies vaccination as well as immunoglobulin.  Patient reports he was seen here 2 days ago but did not receive vaccinations at this time.  On exam patient does have puncture wounds to left posterior chest wall consistent with dog bite.  There is slight erythema to the dog bites, no purulent discharge.  No appreciable fluid collection or abscess needed drainage.  Overall the patient is  nontoxic in appearance.  His vital signs are reassuring.  Patient given rabies vaccination along with immunoglobulin.  Patient will be encouraged to return on day 3 which will be 9/3.  Patient already on Augmentin which she was placed on 2 days ago at initial visit.  Patient encouraged to continue taking medication.  Patient discharged in stable condition at this time.   Final Clinical Impression(s) / ED Diagnoses Final diagnoses:  Dog bite, subsequent encounter  Need for rabies vaccination    Rx / DC Orders ED Discharge Orders     None         Clent Ridges 02/11/23 1427    Wynetta Fines, MD 02/11/23 1556

## 2023-02-11 NOTE — ED Triage Notes (Signed)
Pt reports being bitten by a dog on Thursday. Pt reports they are here for rabies vaccines.

## 2023-02-14 ENCOUNTER — Other Ambulatory Visit: Payer: Self-pay

## 2023-02-14 ENCOUNTER — Encounter (HOSPITAL_COMMUNITY): Payer: Self-pay

## 2023-02-14 ENCOUNTER — Ambulatory Visit (HOSPITAL_COMMUNITY)
Admission: RE | Admit: 2023-02-14 | Discharge: 2023-02-14 | Disposition: A | Discharge status: Home / Self Care | Payer: Medicare HMO | Source: Ambulatory Visit | Attending: Emergency Medicine | Admitting: Emergency Medicine

## 2023-02-14 DIAGNOSIS — Z2914 Encounter for prophylactic rabies immune globin: Secondary | ICD-10-CM

## 2023-02-14 DIAGNOSIS — Z203 Contact with and (suspected) exposure to rabies: Secondary | ICD-10-CM

## 2023-02-14 DIAGNOSIS — W540XXA Bitten by dog, initial encounter: Secondary | ICD-10-CM | POA: Diagnosis not present

## 2023-02-14 DIAGNOSIS — Z23 Encounter for immunization: Secondary | ICD-10-CM | POA: Diagnosis not present

## 2023-02-14 MED ORDER — RABIES VACCINE, PCEC IM SUSR
1.0000 mL | Freq: Once | INTRAMUSCULAR | Status: AC
  Administered 2023-02-14: 1 mL via INTRAMUSCULAR

## 2023-02-14 MED ORDER — RABIES VACCINE, PCEC IM SUSR
INTRAMUSCULAR | Status: AC
  Filled 2023-02-14: qty 1

## 2023-02-14 NOTE — ED Triage Notes (Signed)
Patient was bitten by a dog on 02/09/2023.  Patient was seen in [place] ED on 02/11/2023 and this is when rabies series was initiated.  Patient is not clear what took place at which visit to ED.    Patient is in Department today for rabies vaccine

## 2023-02-14 NOTE — Discharge Instructions (Signed)
Return as instructed.  Return sooner if any concerns

## 2023-02-18 ENCOUNTER — Ambulatory Visit (HOSPITAL_COMMUNITY)
Admission: EM | Admit: 2023-02-18 | Discharge: 2023-02-18 | Disposition: A | Discharge status: Home / Self Care | Payer: Medicare HMO | Attending: Emergency Medicine | Admitting: Emergency Medicine

## 2023-02-18 DIAGNOSIS — Z203 Contact with and (suspected) exposure to rabies: Secondary | ICD-10-CM | POA: Diagnosis not present

## 2023-02-18 MED ORDER — RABIES VACCINE, PCEC IM SUSR
INTRAMUSCULAR | Status: AC
  Filled 2023-02-18: qty 1

## 2023-02-18 MED ORDER — RABIES VACCINE, PCEC IM SUSR
1.0000 mL | Freq: Once | INTRAMUSCULAR | Status: AC
  Administered 2023-02-18: 1 mL via INTRAMUSCULAR

## 2023-02-18 NOTE — ED Triage Notes (Signed)
Pt presents to office for rabies injection #2. Pt tolerated well and will scheduled next appt.

## 2023-02-25 ENCOUNTER — Ambulatory Visit (HOSPITAL_COMMUNITY)
Admission: EM | Admit: 2023-02-25 | Discharge: 2023-02-25 | Disposition: A | Discharge status: Home / Self Care | Payer: Medicare HMO | Attending: Internal Medicine

## 2023-02-25 DIAGNOSIS — Z203 Contact with and (suspected) exposure to rabies: Secondary | ICD-10-CM

## 2023-02-25 MED ORDER — RABIES VACCINE, PCEC IM SUSR
1.0000 mL | Freq: Once | INTRAMUSCULAR | Status: AC
  Administered 2023-02-25: 1 mL via INTRAMUSCULAR

## 2023-02-25 MED ORDER — RABIES VACCINE, PCEC IM SUSR: 1.0000 mL | Freq: Once | INTRAMUSCULAR | Status: DC

## 2023-02-25 MED ORDER — RABIES VACCINE, PCEC IM SUSR
INTRAMUSCULAR | Status: AC
  Filled 2023-02-25: qty 1

## 2023-02-25 NOTE — ED Triage Notes (Signed)
Pt presents for final rabies injection.  Family member inquiring whether Rabavert causing hyperglycemia, as pt's CBG was elevated this AM. No information on pkg insert notes hyperglycemia as side effect. T/C to pharmacy: states hyperglycemia is not known to be a side effect according to their literature.

## 2023-02-25 NOTE — Discharge Instructions (Signed)
You have completed the rabies vaccine series. Follow-up with your primary care physician for any further needs or concerns.

## 2023-02-27 ENCOUNTER — Telehealth: Payer: Self-pay

## 2023-02-27 NOTE — Telephone Encounter (Signed)
Transition Care Management Follow-up Telephone Call Date of discharge and from where: [facility] 8/31 How have you been since you were released from the hospital? Doing ok and following up with providers  Any questions or concerns? No  Items Reviewed: Did the pt receive and understand the discharge instructions provided? Yes  Medications obtained and verified? No  Other? No  Any new allergies since your discharge? No  Dietary orders reviewed? No Do you have support at home? Yes    Follow up appointments reviewed:  PCP Hospital f/u appt confirmed? Yes  Scheduled to see  on  @ . Specialist Hospital f/u appt confirmed? Yes  Scheduled to see  on  @ . Are transportation arrangements needed? No  If their condition worsens, is the pt aware to call PCP or go to the Emergency Dept.? Yes Was the patient provided with contact information for the PCP's office or ED? Yes Was to pt encouraged to call back with questions or concerns? Yes

## 2023-05-18 ENCOUNTER — Other Ambulatory Visit: Payer: Self-pay | Admitting: Cardiology

## 2023-07-17 DIAGNOSIS — N183 Chronic kidney disease, stage 3 unspecified: Secondary | ICD-10-CM | POA: Diagnosis not present

## 2023-07-17 DIAGNOSIS — Z7985 Long-term (current) use of injectable non-insulin antidiabetic drugs: Secondary | ICD-10-CM | POA: Diagnosis not present

## 2023-07-17 DIAGNOSIS — E1122 Type 2 diabetes mellitus with diabetic chronic kidney disease: Secondary | ICD-10-CM | POA: Diagnosis not present

## 2023-07-17 DIAGNOSIS — Z794 Long term (current) use of insulin: Secondary | ICD-10-CM | POA: Diagnosis not present

## 2023-08-17 DIAGNOSIS — E1122 Type 2 diabetes mellitus with diabetic chronic kidney disease: Secondary | ICD-10-CM | POA: Diagnosis not present

## 2023-08-17 DIAGNOSIS — N183 Chronic kidney disease, stage 3 unspecified: Secondary | ICD-10-CM | POA: Diagnosis not present

## 2023-08-30 NOTE — Progress Notes (Signed)
 This encounter was created in error - please disregard.  Called [facility]3 with no answer/ LDM for patient to call office to reschedule visit.

## 2023-08-31 ENCOUNTER — Encounter: Payer: Self-pay | Admitting: Internal Medicine

## 2023-09-02 ENCOUNTER — Emergency Department (HOSPITAL_COMMUNITY)

## 2023-09-02 ENCOUNTER — Emergency Department (HOSPITAL_COMMUNITY)
Admission: EM | Admit: 2023-09-02 | Discharge: 2023-09-02 | Disposition: A | Discharge status: Home / Self Care | Attending: Emergency Medicine | Admitting: Emergency Medicine

## 2023-09-02 DIAGNOSIS — M79602 Pain in left arm: Secondary | ICD-10-CM

## 2023-09-02 DIAGNOSIS — Z0389 Encounter for observation for other suspected diseases and conditions ruled out: Secondary | ICD-10-CM | POA: Diagnosis not present

## 2023-09-02 DIAGNOSIS — Z794 Long term (current) use of insulin: Secondary | ICD-10-CM | POA: Diagnosis not present

## 2023-09-02 DIAGNOSIS — S41132A Puncture wound without foreign body of left upper arm, initial encounter: Secondary | ICD-10-CM | POA: Diagnosis not present

## 2023-09-02 DIAGNOSIS — X58XXXA Exposure to other specified factors, initial encounter: Secondary | ICD-10-CM | POA: Diagnosis not present

## 2023-09-02 DIAGNOSIS — Z7982 Long term (current) use of aspirin: Secondary | ICD-10-CM | POA: Insufficient documentation

## 2023-09-02 DIAGNOSIS — M79622 Pain in left upper arm: Secondary | ICD-10-CM | POA: Diagnosis present

## 2023-09-02 NOTE — ED Provider Notes (Signed)
 [facility] AT [facility] Provider Note   CSN: [id number] Arrival date & time: 09/02/23  1234     History  Chief Complaint  Patient presents with   Foreign Body    LANNIS LICHTENWALNER is a 83 y.o. male.  83 year old male presents with concern for foreign body to left upper extremity.  Had a sensor that was in his arm and wife is concerned that may have broken off.  When she palpated the area, she did not feel a foreign body.  He has had no drainage from the area.      Home Medications Prior to Admission medications   Medication Sig Start Date End Date Taking? Authorizing Provider  Accu-Chek Softclix Lancets lancets 1 each by Other route 2 (two) times daily. E11.9 01/27/20   Romero Belling, MD  acetaminophen (TYLENOL) 500 MG tablet Take 1,000-1,500 mg by mouth every 8 (eight) hours as needed for moderate pain or headache.    [provider]  amoxicillin-clavulanate (AUGMENTIN) 875-125 MG tablet Take 1 tablet by mouth every 12 (twelve) hours. 02/09/23   Jeannie Fend, PA-C  aspirin 81 MG chewable tablet Chew 1 tablet (81 mg total) by mouth daily. 07/07/14   Allayne Butcher, PA-C  aspirin EC 81 MG tablet Take by mouth. 07/18/22   [provider]  baclofen (LIORESAL) 10 MG tablet Take 1 tablet (10 mg total) by mouth 3 (three) times daily. 09/02/21   Myrlene Broker, MD  Blood Glucose Calibration (ACCU-CHEK AVIVA) SOLN 1 each by Other route as needed (Use to calibrate glucometer). E11.9 02/26/20   Romero Belling, MD  Blood Glucose Monitoring Suppl (TRUE METRIX METER) w/Device KIT 1 each by Does not apply route 2 (two) times daily. 07/01/21   Myrlene Broker, MD  carvedilol (COREG) 3.125 MG tablet TAKE 1 TABLET TWICE DAILY WITH MEALS. (PLEASE SCHEDULE AN APPOINTMENT FOR FUTURE REFILLS.) 12/09/22   [place], Peter M, MD  Dulaglutide (TRULICITY) 3 MG/0.5ML SOPN Inject 3 mg as directed once a week. 11/02/21   Carlus Pavlov, MD   gabapentin (NEURONTIN) 100 MG capsule Take by mouth. 07/18/22   [provider]  glucose blood (TRUE METRIX PRO BLOOD GLUCOSE) test strip Use as instructed 07/01/21   Myrlene Broker, MD  insulin NPH-regular Human (70-30) 100 UNIT/ML injection Inject 20 Units into the skin daily with breakfast. 10/12/21   Romero Belling, MD  lisinopril (ZESTRIL) 10 MG tablet Take 1 tablet (10 mg total) by mouth daily. 05/22/23   [place], Peter M, MD  loperamide (IMODIUM A-D) 2 MG tablet Take 4 mg by mouth 4 (four) times daily as needed for diarrhea or loose stools.    [provider]  nitroGLYCERIN (NITROSTAT) 0.4 MG SL tablet Place 1 tablet (0.4 mg total) under the tongue every 5 (five) minutes x 3 doses as needed for chest pain. 06/22/17   [place], Peter M, MD  rosuvastatin (CRESTOR) 5 MG tablet Take 1 tablet (5 mg total) by mouth daily. 12/19/22   [place], Peter M, MD  Simethicone (GAS-X PO) Take 1 tablet by mouth as needed (gas).    [provider]      Allergies    Metformin and related and Ozempic (0.25 or 0.5 mg-dose) [semaglutide(0.25 or 0.5mg -dos)]    Review of Systems   Review of Systems  All other systems reviewed and are negative.   Physical Exam Updated Vital Signs BP 135/82   Pulse 80   Temp 97.7 F (36.5  C) (Oral)   Resp 15   Ht 1.676 m (5\' 6" )   Wt 68 kg   SpO2 99%   BMI 24.21 kg/m  Physical Exam Vitals and nursing note reviewed.  Constitutional:      General: He is not in acute distress.    Appearance: Normal appearance. He is well-developed. He is not toxic-appearing.  HENT:     Head: Normocephalic and atraumatic.  Eyes:     General: Lids are normal.     Conjunctiva/sclera: Conjunctivae normal.     Pupils: Pupils are equal, round, and reactive to light.  Neck:     Thyroid: No thyroid mass.     Trachea: No tracheal deviation.  Cardiovascular:     Rate and Rhythm: Normal rate and regular rhythm.     Heart sounds: Normal heart sounds. No murmur  heard.    No gallop.  Pulmonary:     Effort: Pulmonary effort is normal. No respiratory distress.     Breath sounds: Normal breath sounds. No stridor. No decreased breath sounds, wheezing, rhonchi or rales.  Abdominal:     General: There is no distension.     Palpations: Abdomen is soft.     Tenderness: There is no abdominal tenderness. There is no rebound.  Musculoskeletal:        General: No tenderness. Normal range of motion.       Arms:     Cervical back: Normal range of motion and neck supple.  Skin:    General: Skin is warm and dry.     Findings: No abrasion or rash.  Neurological:     Mental Status: He is alert and oriented to person, place, and time. Mental status is at baseline.     GCS: GCS eye subscore is 4. GCS verbal subscore is 5. GCS motor subscore is 6.     Cranial Nerves: No cranial nerve deficit.     Sensory: No sensory deficit.     Motor: Motor function is intact.  Psychiatric:        Attention and Perception: Attention normal.        Speech: Speech normal.        Behavior: Behavior normal.    ED Results / Procedures / Treatments   Labs (all labs ordered are listed, but only abnormal results are displayed) Labs Reviewed - No data to display  EKG None  Radiology No results found.  Procedures Procedures    Medications Ordered in ED Medications - No data to display  ED Course/ Medical Decision Making/ A&P                                 Medical Decision Making Amount and/or Complexity of Data Reviewed Radiology: ordered.   Xray fo left arm without FB F/u instructions given        Final Clinical Impression(s) / ED Diagnoses Final diagnoses:  None    Rx / DC Orders ED Discharge Orders     None         Lorre Nick, MD 09/02/23 1406

## 2023-09-02 NOTE — Discharge Instructions (Signed)
 Your xray did not show any metal in your arm Return here for worsening swelling, redness, drainage, or any other problems

## 2023-09-02 NOTE — ED Triage Notes (Signed)
 Pt wife removed his cbg sensor from left upper arm d/t malfunction and when she checked it there was no needle. Denies pain

## 2023-11-18 ENCOUNTER — Other Ambulatory Visit: Payer: Self-pay | Admitting: Cardiology

## 2024-01-10 ENCOUNTER — Telehealth: Payer: Self-pay

## 2024-01-10 NOTE — Telephone Encounter (Signed)
 Copied from CRM [id number]. Topic: General - Other >> Jan 10, 2024  2:52 PM Paige D wrote: Reason for CRM: pt wife called in regards to a message from bernice pt wife stated she had a message from Dr. Rollene. PT wife would like a call back from bernice.

## 2024-01-11 ENCOUNTER — Other Ambulatory Visit: Payer: Self-pay | Admitting: Cardiology

## 2024-01-22 ENCOUNTER — Other Ambulatory Visit: Payer: Self-pay | Admitting: Cardiology

## 2024-01-29 DIAGNOSIS — N183 Chronic kidney disease, stage 3 unspecified: Secondary | ICD-10-CM | POA: Diagnosis not present

## 2024-01-29 DIAGNOSIS — E1122 Type 2 diabetes mellitus with diabetic chronic kidney disease: Secondary | ICD-10-CM | POA: Diagnosis not present

## 2024-01-29 DIAGNOSIS — Z794 Long term (current) use of insulin: Secondary | ICD-10-CM | POA: Diagnosis not present

## 2024-02-02 ENCOUNTER — Other Ambulatory Visit: Payer: Self-pay | Admitting: Cardiology

## 2024-02-05 ENCOUNTER — Other Ambulatory Visit (HOSPITAL_COMMUNITY): Payer: Self-pay

## 2024-02-05 ENCOUNTER — Ambulatory Visit: Payer: Self-pay

## 2024-02-05 NOTE — Telephone Encounter (Signed)
 FYI Only or Action Required?: Action required by provider: antiviral request.  Patient was last seen in primary care on 02/10/2023 by Elnor Lauraine BRAVO, NP.  Called Nurse Triage reporting Covid Positive.  Symptoms began several days ago.  Interventions attempted: Rest, hydration, or home remedies.  Symptoms are: unchanged.  Triage Disposition: See HCP Within 4 Hours (Or PCP Triage)  Patient/caregiver understands and will follow disposition?: No, wishes to speak with PCP   Reason for Disposition  MILD difficulty breathing (e.g., minimal/no SOB at rest, SOB with walking, pulse <100)  Answer Assessment - Initial Assessment Questions Additional info: 1) Returned call to spouse for her triage pink word crm, she also asked for spouse to be triaged and prescribed antiviral medication. Refused urgent care for shortness of breath, wants antiviral called in.  2) Requested office visit with pcp for follow up to chronic conditions, has not seen pcp in over one year. Scheduled     1. COVID-19 DIAGNOSIS: How do you know that you have COVID? (e.g., positive lab test or self-test, diagnosed by doctor or NP/PA, symptoms after exposure).     Home test positive 02/04/24 2. COVID-19 EXPOSURE: Was there any known exposure to COVID before the symptoms began? CDC Definition of close contact: within 6 feet (2 meters) for a total of 15 minutes or more over a 24-hour period.      Visiting wife in ER on 01/30/24 3. ONSET: When did the COVID-19 symptoms start?      Friday  4. WORST SYMPTOM: What is your worst symptom? (e.g., cough, fever, shortness of breath, muscle aches)     Shortness of breath 5. COUGH: Do you have a cough? If Yes, ask: How bad is the cough?       Mild with shortness of breath 6. FEVER: Do you have a fever? If Yes, ask: What is your temperature, how was it measured, and when did it start?     denies 7. RESPIRATORY STATUS: Describe your breathing? (e.g., normal; shortness  of breath, wheezing, unable to speak)      Shortness of breath intermittently 8. BETTER-SAME-WORSE: Are you getting better, staying the same or getting worse compared to yesterday?  If getting worse, ask, In what way?     worse 9. OTHER SYMPTOMS: Do you have any other symptoms?  (e.g., chills, fatigue, headache, loss of smell or taste, muscle pain, sore throat)     Fatigue, feels miserable, headache 10. HIGH RISK DISEASE: Do you have any chronic medical problems? (e.g., asthma, heart or lung disease, weak immune system, obesity, etc.)       heart  Protocols used: Coronavirus (COVID-19) Diagnosed or Suspected-A-AH

## 2024-02-06 NOTE — Telephone Encounter (Signed)
 Called patient and LVM 1st attempt ask that patient give our office a call back

## 2024-02-06 NOTE — Telephone Encounter (Signed)
 Spoke with patient wife and addressed this to the patient as he did not answer and she stated that he is feeling alittle bit better and will wait on making an appointment for now and when they are feeling a lot better they will call the office to make there appt for there physicals.

## 2024-02-06 NOTE — Telephone Encounter (Signed)
 Needs visit and may be outside window for paxlovid with symptoms starting 5 days ago. Also we have no recent renal function which is required for paxlovid so may not be able to get regardless of visit.

## 2024-02-06 NOTE — Telephone Encounter (Signed)
 Patient does not want to speak with provider. Please advise as he is requesting medication

## 2024-02-13 ENCOUNTER — Ambulatory Visit: Admitting: Internal Medicine

## 2024-04-08 ENCOUNTER — Other Ambulatory Visit: Payer: Self-pay | Admitting: Cardiology

## 2024-04-15 ENCOUNTER — Ambulatory Visit (INDEPENDENT_AMBULATORY_CARE_PROVIDER_SITE_OTHER): Admitting: Internal Medicine

## 2024-04-15 ENCOUNTER — Encounter: Payer: Self-pay | Admitting: Internal Medicine

## 2024-04-15 VITALS — BP 122/80 | HR 70 | Temp 98.4°F | Ht 66.0 in | Wt 159.0 lb

## 2024-04-15 DIAGNOSIS — E118 Type 2 diabetes mellitus with unspecified complications: Secondary | ICD-10-CM

## 2024-04-15 DIAGNOSIS — F0150 Vascular dementia without behavioral disturbance: Secondary | ICD-10-CM | POA: Insufficient documentation

## 2024-04-15 DIAGNOSIS — G40909 Epilepsy, unspecified, not intractable, without status epilepticus: Secondary | ICD-10-CM

## 2024-04-15 DIAGNOSIS — N1832 Chronic kidney disease, stage 3b: Secondary | ICD-10-CM

## 2024-04-15 DIAGNOSIS — I10 Essential (primary) hypertension: Secondary | ICD-10-CM | POA: Diagnosis not present

## 2024-04-15 DIAGNOSIS — E1169 Type 2 diabetes mellitus with other specified complication: Secondary | ICD-10-CM

## 2024-04-15 DIAGNOSIS — F01B0 Vascular dementia, moderate, without behavioral disturbance, psychotic disturbance, mood disturbance, and anxiety: Secondary | ICD-10-CM

## 2024-04-15 DIAGNOSIS — R413 Other amnesia: Secondary | ICD-10-CM | POA: Diagnosis not present

## 2024-04-15 DIAGNOSIS — Z0000 Encounter for general adult medical examination without abnormal findings: Secondary | ICD-10-CM

## 2024-04-15 DIAGNOSIS — E785 Hyperlipidemia, unspecified: Secondary | ICD-10-CM | POA: Diagnosis not present

## 2024-04-15 DIAGNOSIS — K760 Fatty (change of) liver, not elsewhere classified: Secondary | ICD-10-CM

## 2024-04-15 DIAGNOSIS — E119 Type 2 diabetes mellitus without complications: Secondary | ICD-10-CM

## 2024-04-15 LAB — COMPREHENSIVE METABOLIC PANEL WITH GFR
ALT: 19 U/L (ref 0–53)
AST: 22 U/L (ref 0–37)
Albumin: 4.6 g/dL (ref 3.5–5.2)
Alkaline Phosphatase: 87 U/L (ref 39–117)
BUN: 23 mg/dL (ref 6–23)
CO2: 29 meq/L (ref 19–32)
Calcium: 9.5 mg/dL (ref 8.4–10.5)
Chloride: 104 meq/L (ref 96–112)
Creatinine, Ser: 1.72 mg/dL — ABNORMAL HIGH (ref 0.40–1.50)
GFR: 36.94 mL/min — ABNORMAL LOW (ref >60.00)
Glucose, Bld: 78 mg/dL (ref 70–99)
Potassium: 3.7 meq/L (ref 3.5–5.1)
Sodium: 143 meq/L (ref 135–145)
Total Bilirubin: 0.5 mg/dL (ref 0.2–1.2)
Total Protein: 7.4 g/dL (ref 6.0–8.3)

## 2024-04-15 LAB — CBC
HCT: 45.6 % (ref 39.0–52.0)
Hemoglobin: 15.5 g/dL (ref 13.0–17.0)
MCHC: 33.9 g/dL (ref 30.0–36.0)
MCV: 92.9 fl (ref 78.0–100.0)
Platelets: 119 K/uL — ABNORMAL LOW (ref 150.0–400.0)
RBC: 4.91 Mil/uL (ref 4.22–5.81)
RDW: 13.6 % (ref 11.5–15.5)
WBC: 6.3 K/uL (ref 4.0–10.5)

## 2024-04-15 LAB — LIPID PANEL
Cholesterol: 157 mg/dL (ref 0–200)
HDL: 62.2 mg/dL (ref >39.00)
LDL Cholesterol: 48 mg/dL (ref 0–99)
NonHDL: 95.11
Total CHOL/HDL Ratio: 3
Triglycerides: 235 mg/dL — ABNORMAL HIGH (ref 0.0–149.0)
VLDL: 47 mg/dL — ABNORMAL HIGH (ref 0.0–40.0)

## 2024-04-15 LAB — MICROALBUMIN / CREATININE URINE RATIO
Creatinine,U: 151.9 mg/dL
Microalb Creat Ratio: 27.7 mg/g (ref 0.0–30.0)
Microalb, Ur: 4.2 mg/dL — ABNORMAL HIGH (ref 0.0–1.9)

## 2024-04-15 MED ORDER — PANTOPRAZOLE SODIUM 40 MG PO TBEC: 40.0000 mg | DELAYED_RELEASE_TABLET | Freq: Every day | ORAL | 3 refills | Status: AC

## 2024-04-15 NOTE — Progress Notes (Signed)
" ° °  Subjective:   Patient ID: Scott Wilkins, male    DOB: 06-Nov-1940, 83 y.o.   MRN: [id number]  The patient is here for physical. Pertinent topics discussed: Discussed the use of AI scribe software for clinical note transcription with the patient, who gave verbal consent to proceed.  History of Present Illness Scott Wilkins is an 83 year old male with a history of brain injury and stroke who presents with memory issues and headaches.  He experiences severe headaches daily, originating in his eye and radiating through his head. He uses Tylenol  for relief. 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  He is experiencing significant memory issues, particularly with short-term memory. His family notes increased forgetfulness, confusion in new environments, and difficulty recalling recent events, such as not remembering which room was his during a recent trip. He also forgets daily activities like whether he had breakfast. His family is concerned about his safety, especially when he goes for walks at night due to his diabetes.  He has a history of diabetes with fluctuating blood sugar levels, previously ranging from 300 to 700. He is now using a glucose sensor, which has helped stabilize his levels, with a recent hemoglobin A1c of 7.6. His family is actively involved in managing his diabetes care.  He reports stomach pain and has been using Pepto Bismol for relief. He previously used 'gastrops' but they are no longer available.  His family is concerned about his dementia and is seeking a formal diagnosis to plan for future care needs. They have observed a decline in his cognitive function and are looking for resources and support.  PMH, [facility], social history reviewed and updated  Review of Systems  Constitutional: Negative.   HENT: Negative.    Eyes: Negative.   Respiratory:  Negative for cough, chest  tightness and shortness of breath.   Cardiovascular:  Negative for chest pain, palpitations and leg swelling.  Gastrointestinal:  Negative for abdominal distention, abdominal pain, constipation, diarrhea, nausea and vomiting.  Musculoskeletal: Negative.   Skin: Negative.   Neurological: Negative.        Memory change  Psychiatric/Behavioral: Negative.      Objective:  Physical Exam Constitutional:      Appearance: He is well-developed.  HENT:     Head: Normocephalic and atraumatic.  Cardiovascular:     Rate and Rhythm: Normal rate and regular rhythm.  Pulmonary:     Effort: Pulmonary effort is normal. No respiratory distress.     Breath sounds: Normal breath sounds. No wheezing or rales.  Abdominal:     General: Bowel sounds are normal. There is no distension.     Palpations: Abdomen is soft.     Tenderness: There is no abdominal tenderness.  Musculoskeletal:     Cervical back: Normal range of motion.  Skin:    General: Skin is warm and dry.  Neurological:     Mental Status: He is alert and oriented to person, place, and time.     Coordination: Coordination normal.     Vitals:   04/15/24 1435  BP: 122/80  Pulse: 70  Temp: 98.4 F (36.9 C)  TempSrc: Oral  SpO2: 98%  Weight: 159 lb (72.1 kg)  Height: 5' 6 (1.676 m)    Assessment & Plan:   "

## 2024-04-15 NOTE — Patient Instructions (Signed)
 We will get you in with the neurologist to check the memory.  We have sent in protonix (pantoprazole) to take daily for the stomach. If not working in 1-2 weeks let us  know.

## 2024-04-16 ENCOUNTER — Ambulatory Visit: Payer: Self-pay | Admitting: Internal Medicine

## 2024-04-17 NOTE — Progress Notes (Signed)
 Spoke with wife. Pt is aware of labs. Wife had a question about medication to be called in for husband's stomach. Notified her that rx was sent in to [facility].

## 2024-04-19 NOTE — Assessment & Plan Note (Signed)
 Checking UACR and CMP and adjust as needed.

## 2024-04-19 NOTE — Assessment & Plan Note (Signed)
 Checking lipid panel and adjust as needed.

## 2024-04-19 NOTE — Assessment & Plan Note (Signed)
 Referral to neurology and family is noticing some progression. 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

## 2024-04-19 NOTE — Assessment & Plan Note (Signed)
 Checking CMP and adjust as needed.

## 2024-04-19 NOTE — Assessment & Plan Note (Signed)
 Checking UACR, HGA1c, lipid panel and CMP and adjust regimen as needed. Denies low sugars.

## 2024-04-19 NOTE — Assessment & Plan Note (Signed)
 Some progression lately out of proportion. 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

## 2024-04-19 NOTE — Assessment & Plan Note (Signed)
 BP at goal checking CMP and adjust regimen as needed.

## 2024-04-19 NOTE — Assessment & Plan Note (Signed)
Flu shot declines. Pneumonia declines. Shingrix declines. Tetanus declines. Colonoscopy aged out. Counseled about sun safety and mole surveillance. Counseled about the dangers of distracted driving. Given 10 year screening recommendations.

## 2024-04-19 NOTE — Assessment & Plan Note (Signed)
 Stable without seizures off meds. Continue to monitor closely.

## 2024-04-30 ENCOUNTER — Encounter: Payer: Self-pay | Admitting: Physician Assistant

## 2024-05-14 NOTE — Progress Notes (Unsigned)
 Cardiology Office Note   Date:  05/23/2024   ID:  Scott Wilkins, Scott Wilkins 03-16-1941, MRN [id number]  PCP:  Rollene Almarie LABOR, MD  Cardiologist:  Dr. Livian Vanderbeck    Chief Complaint  Patient presents with   Coronary Artery Disease      History of Present Illness: Scott Wilkins is a 83 y.o. male who is seen for follow up CAD.   He was admitted July 04, 2014 with  lateral ST elevation MI. He underwent emergent cath and received a drug-eluting stent Promus Premier to the proximal circumflex. He also had POBA of prox. diagonal 1. He also had severe disease in the distal LAD that was too small for PCI. His EF remained fairly normal 50-55%. He is diabetic.   On follow up today he is seen with his wife. She notes occasional sharp chest pain but it goes away on its own. His memory has deteriorated further. Complains of a lot of HA around his left eye and temple.     Past Medical History:  Diagnosis Date   Arthritis    CAD (coronary artery disease)    a. 06/2014 Lateral STEMI/PCI: LM nl, LAD 90d, small, D1 95 (PTCA only), LCX 90p (3.5 x 16 Promus DES), RCA dominant, 56m, RPDA 44m.   CKD (chronic kidney disease), stage III (HCC)    Diabetes mellitus    Headache    History of kidney stones    Hyperlipidemia    Hypertension    Rosacea    Sciatica    Seizures (HCC)    h/o - none in years    Past Surgical History:  Procedure Laterality Date   CRANIOTOMY  06/13/1946   left craniectomy after head trauma   CYSTOSCOPY     CYSTOSCOPY WITH URETHRAL DILATATION N/A 09/08/2021   Procedure: CYSTOSCOPY WITH OPTILUM URETHRAL DILATATION;  Surgeon: Carolee Sherwood JONETTA DOUGLAS, MD;  Location: [facility];  Service: Urology;  Laterality: N/A;  45 MINS FOR CASE   LEFT HEART CATHETERIZATION WITH CORONARY ANGIOGRAM N/A 07/04/2014   Procedure: LEFT HEART CATHETERIZATION WITH CORONARY ANGIOGRAM;  Surgeon: Karyme Mcconathy M Bellamarie Pflug, MD;  Location: [facility] CATH LAB;  Service: Cardiovascular;  Laterality: N/A;   MULTIPLE TOOTH  EXTRACTIONS     PERCUTANEOUS CORONARY STENT INTERVENTION (PCI-S)  07/04/2014   Procedure: PERCUTANEOUS CORONARY STENT INTERVENTION (PCI-S);  Surgeon: Willistine Ferrall M Cyntha Brickman, MD;  Location: [facility] CATH LAB;  Service: Cardiovascular;;  prox CFX   RIB FRACTURE SURGERY  06/13/1974   ORIF after frcture due to trauma   right leg sur       Current Outpatient Medications  Medication Sig Dispense Refill   Accu-Chek Softclix Lancets lancets 1 each by Other route 2 (two) times daily. E11.9 200 each 0   acetaminophen  (TYLENOL ) 500 MG tablet Take 1,000-1,500 mg by mouth every 8 (eight) hours as needed for moderate pain or headache.     amoxicillin -clavulanate (AUGMENTIN ) 875-125 MG tablet Take 1 tablet by mouth every 12 (twelve) hours. 14 tablet 0   aspirin  81 MG chewable tablet Chew 1 tablet (81 mg total) by mouth daily.     aspirin  EC 81 MG tablet Take by mouth.     baclofen  (LIORESAL ) 10 MG tablet Take 1 tablet (10 mg total) by mouth 3 (three) times daily. 60 each 0   Blood Glucose Calibration (ACCU-CHEK AVIVA) SOLN 1 each by Other route as needed (Use to calibrate glucometer). E11.9 1 each 0   Blood Glucose Monitoring Suppl (TRUE METRIX METER) w/Device  KIT 1 each by Does not apply route 2 (two) times daily. 1 kit 0   carvedilol  (COREG ) 3.125 MG tablet TAKE 1 TABLET TWICE DAILY WITH MEALS. (PLEASE SCHEDULE AN APPOINTMENT FOR FUTURE REFILLS.) 180 tablet 0   Dulaglutide  (TRULICITY ) 3 MG/0.5ML SOPN Inject 3 mg as directed once a week. 6 mL 3   gabapentin (NEURONTIN) 100 MG capsule Take by mouth.     glucose blood (TRUE METRIX PRO BLOOD GLUCOSE) test strip Use as instructed 100 each 12   insulin  NPH-regular Human (70-30) 100 UNIT/ML injection Inject 20 Units into the skin daily with breakfast. 20 mL 1   lisinopril  (ZESTRIL ) 10 MG tablet TAKE 1 TABLET EVERY DAY (NEED MD APPOINTMENT FOR REFILLS) 90 tablet 0   loperamide (IMODIUM A-D) 2 MG tablet Take 4 mg by mouth 4 (four) times daily as needed for diarrhea or loose  stools.     nitroGLYCERIN  (NITROSTAT ) 0.4 MG SL tablet Place 1 tablet (0.4 mg total) under the tongue every 5 (five) minutes x 3 doses as needed for chest pain. 25 tablet 11   pantoprazole  (PROTONIX ) 40 MG tablet Take 1 tablet (40 mg total) by mouth daily. 90 tablet 3   rosuvastatin  (CRESTOR ) 5 MG tablet TAKE 1 TABLET (5 MG TOTAL) BY MOUTH DAILY. 90 tablet 3   Simethicone  (GAS-X PO) Take 1 tablet by mouth as needed (gas).     No current facility-administered medications for this visit.    Allergies:   Metformin  and related and Ozempic  (0.25 or 0.5 mg-dose) [semaglutide (0.25 or 0.5mg -dos)]    Social History:  The patient  reports that he has never smoked. He has never used smokeless tobacco. He reports that he does not drink alcohol and does not use drugs.   Family History:  The patient's family history includes Heart disease in his brother, brother, father, and mother.    ROS:  As noted in HPI. All other systems are reviewed and are otherwise normal.   Wt Readings from Last 3 Encounters:  05/23/24 163 lb (73.9 kg)  04/15/24 159 lb (72.1 kg)  09/02/23 150 lb (68 kg)     PHYSICAL EXAM: VS:  BP 124/80   Pulse 70   Ht 5' 6 (1.676 m)   Wt 163 lb (73.9 kg)   SpO2 95%   BMI 26.31 kg/m  , BMI Body mass index is 26.31 kg/m. GENERAL:  Well appearing WM in NAD HEENT:  PERRL, EOMI, sclera are clear. Oropharynx is clear. NECK:  No jugular venous distention, carotid upstroke brisk and symmetric, no bruits, no thyromegaly or adenopathy LUNGS:  Clear to auscultation bilaterally CHEST:  Unremarkable HEART:  RRR,  PMI not displaced or sustained,S1 and S2 within normal limits, no S3, no S4: no clicks, no rubs, no murmurs ABD:  Soft, nontender. BS +, no masses or bruits. No hepatomegaly, no splenomegaly EXT:  2 + pulses throughout, no edema, no cyanosis no clubbing SKIN:  Warm and dry.  No rashes NEURO:  Alert and oriented x 3. Cranial nerves II through XII intact. PSYCH:  Cognitively  intact    Recent Labs: 04/15/2024: ALT 19; BUN 23; Creatinine, Ser 1.72; Hemoglobin 15.5; Platelets 119.0; Potassium 3.7; Sodium 143    Lipid Panel    Component Value Date/Time   CHOL 157 04/15/2024 1520   CHOL 148 01/02/2023 0000   TRIG 235.0 (H) 04/15/2024 1520   HDL 62.20 04/15/2024 1520   HDL 61 01/02/2023 0000   CHOLHDL 3 04/15/2024 1520   VLDL  47.0 (H) 04/15/2024 1520   LDLCALC 48 04/15/2024 1520   LDLCALC 67 01/02/2023 0000   LDLDIRECT 126.0 08/22/2008 1610    Labs dated 06/25/15: cholesterol 122, triglycerides 126, HDL 46, LDL 51. BUN 25, creatinine 1.56. CMET otherwise normal  Dated 10/18/16 A1c 7.8% Dated 04/27/17: cholesterol 138, triglycerides 178, HDL 52, LDL 51.  Dated 08/22/17: A1c 8.5%. Dated 05/04/18: cholesterol 138, triglycerides 112, HDL 50, LDL 52. Creatinine 1.77. otherwise CMET normal Dated 12/27/18: A1c 8.6% Dated 05/18/20: cholesterol 128, triglycerides 161, HDL 54, LDL 47, Hgb 14.3. ALT and TSH normal Dated 10/05/20: creatinine. 1.49. A1c 8.8. potassium 4.2 Dated 04/15/24: cholesterol 157, triglycerides 235, HDL 62, LDL 48  EKG Interpretation Date/Time:  Thursday May 23 2024 16:09:52 EST Ventricular Rate:  70 PR Interval:  164 QRS Duration:  90 QT Interval:  372 QTC Calculation: 401 R Axis:   51  Text Interpretation: Normal sinus rhythm Normal ECG When compared with ECG of 07-Jul-2014 05:16,  No significant change was found  Confirmed by Xxavier Noon [phone]) on 05/23/2024 4:15:33 PM    Echo 12/13/22: IMPRESSIONS     1. Left ventricular ejection fraction, by estimation, is 60 to 65%. Left  ventricular ejection fraction by 3D volume is 61 %. The left ventricle has  normal function. The left ventricle has no regional wall motion  abnormalities. Left ventricular diastolic   parameters are consistent with Grade I diastolic dysfunction (impaired  relaxation). The average left ventricular global longitudinal strain is  -22.4 %. The global  longitudinal strain is normal.   2. Right ventricular systolic function is normal. The right ventricular  size is normal.   3. The mitral valve is abnormal. No evidence of mitral valve  regurgitation.   4. The aortic valve is abnormal. Aortic valve regurgitation is not  visualized. Aortic valve sclerosis is present, with no evidence of aortic  valve stenosis.   Comparison(s): Unable to view prior study.    ASSESSMENT AND PLAN:  1.  CAD s/p  STEMI of lateral wall with placement of Promus Premier drug-eluting stent to the circumflex and POBA to diagonal 1 in January 2016. Some intermittent chest pain but difficult to quantify given dementia.  He is on aspirin , beta blocker.   2. Hyperlipidemia on low dose Crestor  with LDL 48.   3. Essential hypertension- BP is  well controlled. Continue lisinopril  and Coreg   4  Diabetes mellitus on long term insulin . Management per primary care.   5. CKD stage 3a. Stable.   6. Ischemic optic neuropathyEcho normal.   7. Left sided HA. Per Neuro   8. Memory loss/dementia    Follow up in one year

## 2024-05-23 ENCOUNTER — Encounter: Payer: Self-pay | Admitting: Cardiology

## 2024-05-23 ENCOUNTER — Ambulatory Visit: Attending: Internal Medicine | Admitting: Cardiology

## 2024-05-23 VITALS — BP 124/80 | HR 70 | Ht 66.0 in | Wt 163.0 lb

## 2024-05-23 DIAGNOSIS — E785 Hyperlipidemia, unspecified: Secondary | ICD-10-CM

## 2024-05-23 DIAGNOSIS — E119 Type 2 diabetes mellitus without complications: Secondary | ICD-10-CM | POA: Diagnosis not present

## 2024-05-23 DIAGNOSIS — I25118 Atherosclerotic heart disease of native coronary artery with other forms of angina pectoris: Secondary | ICD-10-CM | POA: Diagnosis not present

## 2024-05-23 NOTE — Patient Instructions (Signed)
 Medication Instructions:  Continue all medications *If you need a refill on your cardiac medications before your next appointment, please call your pharmacy*  Lab Work: None ordered  Testing/Procedures: None ordered  Follow-Up: At [facility], you and your health needs are our priority.  As part of our continuing mission to provide you with exceptional heart care, our providers are all part of one team.  This team includes your primary Cardiologist (physician) and Advanced Practice Providers or APPs (Physician Assistants and Nurse Practitioners) who all work together to provide you with the care you need, when you need it.  Your next appointment:  1 year    Call in August to Dec appointment     Provider:  Dr.Jordan    We recommend signing up for the patient portal called MyChart.  Sign up information is provided on this After Visit Summary.  MyChart is used to connect with patients for Virtual Visits (Telemedicine).  Patients are able to view lab/test results, encounter notes, upcoming appointments, etc.  Non-urgent messages can be sent to your provider as well.   To learn more about what you can do with MyChart, go to [email or web address].

## 2024-06-11 ENCOUNTER — Ambulatory Visit: Payer: Self-pay

## 2024-06-11 ENCOUNTER — Ambulatory Visit: Admitting: Internal Medicine

## 2024-06-11 NOTE — Telephone Encounter (Signed)
 First attempt to reach patient for triage. LVM for patient to return call to [phone]. Placed in callbacks for additional attempts  Copied from CRM #[id number]. Topic: Clinical - Red Word Triage >> Jun 11, 2024 10:22 AM Montie POUR wrote: Red Word that prompted transfer to Nurse Triage:  For the last 2 weeks, he has had a cough. Now he is saying it is hard to breathe, congestion, headache and this is getting worse. >> Jun 11, 2024 10:50 AM Montie POUR wrote: I lost power and tried to call back and I had to leave a message. I also put this in red word chat

## 2024-06-11 NOTE — Telephone Encounter (Signed)
 FYI Only or Action Required?: FYI only for provider: appointment scheduled on 06/11/24.  Patient was last seen in primary care on 04/15/2024 by Rollene Almarie LABOR, MD.  Called Nurse Triage reporting Cough and Shortness of Breath.  Symptoms began several weeks ago.  Interventions attempted: OTC medications:   and Rest, hydration, or home remedies.  Symptoms are: gradually worsening.  Triage Disposition: See HCP Within 4 Hours (Or PCP Triage)  Patient/caregiver understands and will follow disposition?: Yes  Copied from CRM #[id number]. Topic: Clinical - Red Word Triage >> Jun 11, 2024 10:22 AM Montie POUR wrote: Red Word that prompted transfer to Nurse Triage:  For the last 2 weeks, he has had a cough. Now he is saying it is hard to breathe, congestion, headache and this is getting worse. >> Jun 11, 2024 10:50 AM Montie POUR wrote: I lost power and tried to call back and I had to leave a message. I also put this in red word chat  Reason for Disposition  [1] MILD difficulty breathing (e.g., minimal/no SOB at rest, SOB with walking, pulse < 100) AND [2] NEW-onset or WORSE than normal  Answer Assessment - Initial Assessment Questions Returned call to pt and reached pt's wife. Pt in restroom initially but was able to join into call via speaker phone once back with wife. Wife states that pt was exposed to the flu but everyone's symptoms have improved except patients. Pt has had chest congestion and h/a x 2 weeks. Pt is now reporting SOB with coughing and pt's wife states he sound rattley. Pt's wife worried pt symptoms may progress into pneumonia; reports dry cough but congestion is present. Denies fever, distress r/t breathing, no chest pain. Discussed appt in office, pt agreeable and will have daughter bring him. Appointment scheduled for evaluation. Patient agrees with plan of care, and will call back if anything changes, or if symptoms worsen.     1. RESPIRATORY STATUS: Describe your  breathing? (e.g., wheezing, shortness of breath, unable to speak, severe coughing)      SOB; coughing d/t chest congestion   2. ONSET: When did this breathing problem begin?      Worsening over past 2 wks   3. PATTERN Does the difficult breathing come and go, or has it been constant since it started?      Constant   4. SEVERITY: How bad is your breathing? (e.g., mild, moderate, severe)      Moderate   5. RECURRENT SYMPTOM: Have you had difficulty breathing before? If Yes, ask: When was the last time? and What happened that time?      None   6. CARDIAC HISTORY: Do you have any history of heart disease? (e.g., heart attack, angina, bypass surgery, angioplasty)      Yes; hx of MI   8. CAUSE: What do you think is causing the breathing problem?      Flu exposure   9. OTHER SYMPTOMS: Do you have any other symptoms? (e.g., chest pain, cough, dizziness, fever, runny nose)     Chest congestion, dry cough  Protocols used: Breathing Difficulty-A-AH

## 2024-06-12 ENCOUNTER — Ambulatory Visit (HOSPITAL_COMMUNITY)

## 2024-06-12 ENCOUNTER — Other Ambulatory Visit: Payer: Self-pay

## 2024-06-12 ENCOUNTER — Encounter (HOSPITAL_COMMUNITY): Payer: Self-pay | Admitting: Emergency Medicine

## 2024-06-12 ENCOUNTER — Ambulatory Visit (HOSPITAL_COMMUNITY): Admission: EM | Admit: 2024-06-12 | Discharge: 2024-06-12 | Disposition: A | Discharge status: Home / Self Care

## 2024-06-12 DIAGNOSIS — R052 Subacute cough: Secondary | ICD-10-CM

## 2024-06-12 MED ORDER — ALBUTEROL SULFATE HFA 108 (90 BASE) MCG/ACT IN AERS: 1.0000 | INHALATION_SPRAY | Freq: Four times a day (QID) | RESPIRATORY_TRACT | 0 refills | Status: AC | PRN

## 2024-06-12 MED ORDER — ALBUTEROL SULFATE HFA 108 (90 BASE) MCG/ACT IN AERS: 2.0000 | INHALATION_SPRAY | Freq: Once | RESPIRATORY_TRACT | Status: DC

## 2024-06-12 MED ORDER — BENZONATATE 100 MG PO CAPS: 100.0000 mg | ORAL_CAPSULE | Freq: Three times a day (TID) | ORAL | 0 refills | Status: AC | PRN

## 2024-06-12 NOTE — Discharge Instructions (Addendum)
 Your evaluation shows you have a viral infection of the upper airways of your lungs. Use the following medicines to help with your symptoms:  Albuterol inhaler 1-2 puffs every 4-6 hours as needed for cough, shortness of breath, and wheezing.  Tessalon, 1 capsule every 8 hours as needed for cough.  Guaifenesin (mucinex) as needed for cough/nasal congestion.   If you develop any new or worsening symptoms or if your symptoms do not start to improve, please return here or follow-up with your primary care provider. If your symptoms are severe, please go to the emergency room.

## 2024-06-12 NOTE — ED Triage Notes (Signed)
 Cough, sore throat, runny nose, and head and chest congestion for 2 weeks.  As of yesterday started complaining of sob  Over the past for days heating system has stopped working and using fireplace for heat.    Has taken otc cough medicine and mucinex and cough drops

## 2024-06-12 NOTE — ED Provider Notes (Signed)
 " [facility]    CSN: [id number] Arrival date & time: 06/12/24  9182      History   Chief Complaint Chief Complaint  Patient presents with   Cough    HPI Scott Wilkins is a 83 y.o. male.   This 83 year old male is being seen for complaints of headache, congestion, rhinorrhea, sore throat, cough ongoing for 2 weeks.  He also reports that he recently went out in his house and they have been using the fireplace for heat.  He reports over the last couple of days he has developed shortness of breath.  Wife reports they have been in contact with several family members who have been sick.  He has been taking over-the-counter cough medicine and cough drops without significant relief of symptoms.  He denies dizziness, ear pain.  He denies chest pain, abdominal pain, nausea, vomiting, diarrhea.   Cough Associated symptoms: headaches, rhinorrhea, shortness of breath and sore throat   Associated symptoms: no chest pain, no chills, no ear pain and no fever     Past Medical History:  Diagnosis Date   Arthritis    CAD (coronary artery disease)    a. 06/2014 Lateral STEMI/PCI: LM nl, LAD 90d, small, D1 95 (PTCA only), LCX 90p (3.5 x 16 Promus DES), RCA dominant, 68m, RPDA 58m.   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   Chronic kidney disease (CKD), stage III (moderate) (HCC) 05/21/2020   Drowsiness 05/21/2020   Fatty (change of) liver, not elsewhere  classified 05/21/2020   Localized, 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   Memory loss 05/21/2020   Rosacea, unspecified 05/21/2020   Seizure disorder (HCC) 05/21/2020   Low back pain 11/28/2018   Lumbosacral spondylosis without myelopathy 11/28/2018   Pain of left hip joint 11/28/2018   CAD S/P PCI: Lat STEMI: Cx PCI -Promus Premier DES 3.5 x 16, POBA of pD1     Class: Status post   Routine health maintenance 07/31/2011   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   Hyperlipidemia associated with type 2 diabetes mellitus (HCC) 08/22/2008   DYSPHAGIA UNSPECIFIED 07/16/2008   Essential hypertension 06/20/2008    Past Surgical History:  Procedure Laterality Date   CRANIOTOMY  06/13/1946   left craniectomy after head trauma   CYSTOSCOPY     CYSTOSCOPY WITH URETHRAL DILATATION N/A 09/08/2021   Procedure: CYSTOSCOPY WITH OPTILUM URETHRAL DILATATION;  Surgeon: Carolee Sherwood JONETTA DOUGLAS, MD;  Location: [facility];  Service: Urology;  Laterality: N/A;  45 MINS FOR CASE   LEFT HEART CATHETERIZATION WITH CORONARY ANGIOGRAM N/A 07/04/2014   Procedure: LEFT HEART CATHETERIZATION WITH CORONARY ANGIOGRAM;  Surgeon: Peter M Jordan, MD;  Location: [facility] CATH LAB;  Service: Cardiovascular;  Laterality: N/A;   MULTIPLE TOOTH EXTRACTIONS     PERCUTANEOUS CORONARY STENT INTERVENTION (PCI-S)  07/04/2014   Procedure: PERCUTANEOUS CORONARY STENT INTERVENTION (PCI-S);  Surgeon: Peter M Jordan, MD;  Location: [facility] CATH LAB;  Service: Cardiovascular;;  prox CFX   RIB FRACTURE SURGERY  06/13/1974   ORIF after frcture due to trauma   right leg sur         Home Medications    Prior to Admission medications  Medication Sig Start Date End Date Taking? Authorizing Provider  albuterol (VENTOLIN HFA) 108 (90 Base) MCG/ACT inhaler Inhale 1-2 puffs into the lungs every 6 (six) hours as needed for wheezing or shortness of breath. 06/12/24  Yes Nishan Ovens C, FNP  benzonatate (TESSALON) 100 MG capsule  Take 1 capsule (100 mg total) by mouth every 8 (eight) hours as needed for cough. 06/12/24  Yes Natacia Chaisson C, FNP  Accu-Chek Softclix Lancets lancets 1 each by Other route 2 (two) times daily. E11.9 01/27/20   Kassie Mallick, MD  acetaminophen  (TYLENOL ) 500 MG tablet Take 1,000-1,500 mg by mouth every 8 (eight) hours as needed for moderate pain or headache.    [provider]  amoxicillin -clavulanate (AUGMENTIN ) 875-125 MG tablet Take 1 tablet by mouth every 12 (twelve) hours. 02/09/23   Beverley Leita LABOR, PA-C  aspirin  81 MG chewable tablet Chew 1 tablet (81 mg total) by mouth daily. 07/07/14   Marcine Caffie HERO, PA-C  aspirin  EC 81 MG tablet Take by mouth. 07/18/22   [provider]  baclofen  (LIORESAL ) 10 MG tablet Take 1 tablet (10 mg total) by mouth 3 (three) times daily. 09/02/21   Rollene Almarie LABOR, MD  Blood Glucose Calibration (ACCU-CHEK AVIVA) SOLN 1 each by Other route as needed (Use to calibrate glucometer). E11.9 02/26/20   Kassie Mallick, MD  Blood Glucose Monitoring Suppl (TRUE METRIX METER) w/Device KIT 1 each by Does not apply route 2 (two) times daily. 07/01/21   Rollene Almarie LABOR, MD  carvedilol  (COREG ) 3.125 MG tablet TAKE 1 TABLET TWICE DAILY WITH MEALS. (PLEASE SCHEDULE AN APPOINTMENT FOR FUTURE REFILLS.) 11/20/23   Jordan, Peter M, MD  Dulaglutide  (TRULICITY ) 3 MG/0.5ML SOPN Inject 3 mg as directed once a week. 11/02/21   Trixie File, MD  gabapentin (NEURONTIN) 100 MG capsule Take by mouth. 07/18/22   [provider]  glucose blood (TRUE METRIX PRO BLOOD GLUCOSE) test strip Use as instructed 07/01/21   Rollene Almarie LABOR, MD  insulin  NPH-regular Human (70-30) 100 UNIT/ML injection Inject 20 Units into the skin daily with breakfast. 10/12/21   Kassie Mallick, MD  lisinopril  (ZESTRIL ) 10 MG tablet TAKE 1 TABLET EVERY DAY (NEED MD APPOINTMENT FOR REFILLS) 04/10/24   Jordan, Peter M, MD  loperamide (IMODIUM A-D) 2 MG tablet Take 4 mg by mouth 4 (four)  times daily as needed for diarrhea or loose stools.    [provider]  nitroGLYCERIN  (NITROSTAT ) 0.4 MG SL tablet Place 1 tablet (0.4 mg total) under the tongue every 5 (five) minutes x 3 doses as needed for chest pain. 06/22/17   Jordan, Peter M, MD  pantoprazole  (PROTONIX ) 40 MG tablet Take 1 tablet (40 mg total) by mouth daily. 04/15/24   Rollene Almarie LABOR, MD  rosuvastatin  (CRESTOR ) 5 MG tablet TAKE 1 TABLET (5 MG TOTAL) BY MOUTH DAILY. 01/11/24   Jordan, Peter M, MD  Simethicone  (GAS-X PO) Take 1 tablet by mouth as needed (gas).    [provider]    Family History Family History  Problem Relation Age of Onset   Heart disease Father        CAD - died @ 32 of MI  Heart disease Mother        CAD   Heart disease Brother        CAD - died @ 75 of MI   Heart disease Brother        CAD   Cancer Neg Hx    Diabetes Neg Hx    COPD Neg Hx     Social History Social History[1]   Allergies   Metformin  and related and Ozempic  (0.25 or 0.5 mg-dose) [semaglutide (0.25 or 0.5mg -dos)]   Review of Systems Review of Systems  Constitutional:  Positive for activity change. Negative for appetite change, chills and fever.  HENT:  Positive for congestion, rhinorrhea and sore throat. Negative for ear pain.   Respiratory:  Positive for cough and shortness of breath.   Cardiovascular:  Negative for chest pain.  Gastrointestinal:  Negative for abdominal pain, diarrhea, nausea and vomiting.  Genitourinary:  Negative for difficulty urinating, dysuria, frequency and urgency.  Skin:  Negative for color change.  Neurological:  Positive for headaches. Negative for dizziness.     Physical Exam Triage Vital Signs ED Triage Vitals  Encounter Vitals Group     BP 06/12/24 0917 (!) 139/91     Girls Systolic BP Percentile --      Girls Diastolic BP Percentile --      Boys Systolic BP Percentile --      Boys Diastolic BP Percentile --      Pulse Rate 06/12/24 0917 63     Resp  06/12/24 0917 20     Temp 06/12/24 0917 97.7 F (36.5 C)     Temp Source 06/12/24 0917 Oral     SpO2 06/12/24 0917 93 %     Weight --      Height --      Head Circumference --      Peak Flow --      Pain Score 06/12/24 0913 0     Pain Loc --      Pain Education --      Exclude from Growth Chart --    No data found.  Updated Vital Signs BP (!) 139/91 (BP Location: Left Arm)   Pulse 63   Temp 97.7 F (36.5 C) (Oral)   Resp 20   SpO2 93%   Visual Acuity Right Eye Distance:   Left Eye Distance:   Bilateral Distance:    Right Eye Near:   Left Eye Near:    Bilateral Near:     Physical Exam Vitals and nursing note reviewed.  Constitutional:      General: He is not in acute distress.    Appearance: He is well-developed. He is not toxic-appearing.     Comments: Pleasant male appearing stated age found sitting in chair in no acute distress.  HENT:     Head: Normocephalic and atraumatic.     Right Ear: External ear normal. 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     Left Ear: Tympanic membrane and external ear normal.     Nose: Congestion and rhinorrhea present.     Right Turbinates: Enlarged.     Left Turbinates: Enlarged.     Mouth/Throat:     Lips: Pink.     Mouth: Mucous membranes are moist.     Pharynx: Postnasal drip present. No pharyngeal swelling or oropharyngeal exudate.  Eyes:     Conjunctiva/sclera: Conjunctivae normal.  Cardiovascular:     Rate and Rhythm: Normal rate and regular rhythm.  Heart sounds: Normal heart sounds. No murmur heard. Pulmonary:     Effort: Pulmonary effort is normal. No respiratory distress.     Breath sounds: Examination of the right-lower field reveals rhonchi. Rhonchi present.  Abdominal:     General: Bowel sounds are normal.     Palpations: Abdomen is soft.     Tenderness: There is no abdominal tenderness.  Musculoskeletal:     Cervical back: Neck supple.  Skin:    General: Skin  is warm and dry.     Capillary Refill: Capillary refill takes less than 2 seconds.  Neurological:     Mental Status: He is alert.  Psychiatric:        Mood and Affect: Mood normal.      UC Treatments / Results  Labs (all labs ordered are listed, but only abnormal results are displayed) Labs Reviewed - No data to display  EKG   Radiology DG Chest 2 View Result Date: 06/12/2024 CLINICAL DATA:  Cough and chest congestion.  Subacute cough. EXAM: CHEST - 2 VIEW COMPARISON:  None Available. FINDINGS: The cardiomediastinal contours are normal. The lungs are clear. Pulmonary vasculature is normal. No consolidation, pleural effusion, or pneumothorax. No acute osseous abnormalities are seen. Minor degenerative change in the thoracic spine. Remote right clavicle fracture. IMPRESSION: No acute findings. Electronically Signed   By: Andrea Gasman M.D.   On: 06/12/2024 09:51    Procedures Procedures (including critical care time)  Medications Ordered in UC Medications  albuterol (VENTOLIN HFA) 108 (90 Base) MCG/ACT inhaler 2 puff (has no administration in time range)    Initial Impression / Assessment and Plan / UC Course  I have reviewed the triage vital signs and the nursing notes.  Pertinent labs & imaging results that were available during my care of the patient were reviewed by me and considered in my medical decision making (see chart for details).     Vitals and triage reviewed, patient is hemodynamically stable.  Chest x-ray obtained and is negative for acute findings.  Presentation consistent with bronchitis.  Prescription given for tessalon, albuterol inhaler.  He has diabetes, current glucose level 225, therefore no steroids will be given at this time.  Plan of care, follow-up care, return precautions given, no questions at this time. Final Clinical Impressions(s) / UC Diagnoses   Final diagnoses:  Subacute cough     Discharge Instructions      Your evaluation shows  you have a viral infection of the upper airways of your lungs. Use the following medicines to help with your symptoms:  Albuterol inhaler 1-2 puffs every 4-6 hours as needed for cough, shortness of breath, and wheezing.  Tessalon, 1 capsule every 8 hours as needed for cough.  Guaifenesin (mucinex) as needed for cough/nasal congestion.   If you develop any new or worsening symptoms or if your symptoms do not start to improve, please return here or follow-up with your primary care provider. If your symptoms are severe, please go to the emergency room.      ED Prescriptions     Medication Sig Dispense Auth. Provider   benzonatate (TESSALON) 100 MG capsule Take 1 capsule (100 mg total) by mouth every 8 (eight) hours as needed for cough. 21 capsule Tuvia Woodrick C, FNP   albuterol (VENTOLIN HFA) 108 (90 Base) MCG/ACT inhaler Inhale 1-2 puffs into the lungs every 6 (six) hours as needed for wheezing or shortness of breath. 1 each Kody Brandl C, FNP  PDMP not reviewed this encounter.    [1]  Social History Tobacco Use   Smoking status: Never   Smokeless tobacco: Never  Vaping Use   Vaping status: Never Used  Substance Use Topics   Alcohol use: No   Drug use: No     Lennice Jon BROCKS, FNP 06/12/24 1015  "

## 2024-06-27 ENCOUNTER — Other Ambulatory Visit: Payer: Self-pay | Admitting: Cardiology

## 2024-06-27 NOTE — Telephone Encounter (Signed)
 In accordance with refill protocols, please review and address the following requirements before this medication refill can be authorized:  Labs   Cr 04/15/24 Abn

## 2024-07-11 ENCOUNTER — Ambulatory Visit

## 2024-07-11 ENCOUNTER — Other Ambulatory Visit

## 2024-07-11 ENCOUNTER — Ambulatory Visit: Payer: Self-pay | Admitting: Physician Assistant

## 2024-07-11 ENCOUNTER — Encounter: Payer: Self-pay | Admitting: Physician Assistant

## 2024-07-11 VITALS — BP 127/70 | HR 76 | Resp 20 | Ht 66.0 in | Wt 161.0 lb

## 2024-07-11 DIAGNOSIS — R519 Headache, unspecified: Secondary | ICD-10-CM

## 2024-07-11 DIAGNOSIS — R413 Other amnesia: Secondary | ICD-10-CM | POA: Diagnosis not present

## 2024-07-11 MED ORDER — NORTRIPTYLINE HCL 10 MG PO CAPS: 10.0000 mg | ORAL_CAPSULE | Freq: Every day | ORAL | 5 refills | Status: AC

## 2024-07-11 MED ORDER — DONEPEZIL HCL 10 MG PO TABS: ORAL_TABLET | ORAL | 3 refills | Status: AC

## 2024-07-11 NOTE — Patient Instructions (Addendum)
 It was a pleasure to see you today at our office.   Recommendations:  MRI of the brain, the radiology office will call you to arrange you appointment  [phone] Check labs today  [address] Start Donepezil  10 mg :Take half tablet (5 mg) daily for 2 weeks, then increase to the full tablet at 10 mg daily.   Resume nortriptyline  10 mg nightly, may increase to 25 after 4 weeks if needed for headaches Follow up with Dr. Skeet for headaches  Follow up with Camie Sevin in  3 months      [email or web address]   RECOMMENDATIONS FOR ALL PATIENTS WITH MEMORY PROBLEMS: 1. Continue to exercise (Recommend 30 minutes of walking everyday, or 3 hours every week) 2. Increase social interactions - continue going to [place] and enjoy social gatherings with friends and family 3. Eat healthy, avoid fried foods and eat more fruits and vegetables 4. Maintain adequate blood pressure, blood sugar, and blood cholesterol level. Reducing the risk of stroke and cardiovascular disease also helps promoting better memory. 5. Avoid stressful situations. Live a simple life and avoid aggravations. Organize your time and prepare for the next day in anticipation. 6. Sleep well, avoid any interruptions of sleep and avoid any distractions in the bedroom that may interfere with adequate sleep quality 7. Avoid sugar, avoid sweets as there is a strong link between excessive sugar intake, diabetes, and cognitive impairment We discussed the Mediterranean diet, which has been shown to help patients reduce the risk of progressive memory disorders and reduces cardiovascular risk. This includes eating fish, eat fruits and green leafy vegetables, nuts like almonds and hazelnuts, walnuts, and also use olive oil. Avoid fast foods and fried foods as much as possible. Avoid sweets and sugar as sugar use has been linked to worsening of memory function.  There is always a concern of gradual  progression of memory problems. If this is the case, then we may need to adjust level of care according to patient needs. Support, both to the patient and caregiver, should then be put into place.      You have been referred for a neuropsychological evaluation (i.e., evaluation of memory and thinking abilities). Please bring someone with you to this appointment if possible, as it is helpful for the doctor to hear from both you and another adult who knows you well. Please bring eyeglasses and hearing aids if you wear them.    The evaluation will take approximately 3 hours and has two parts:   The first part is a clinical interview with the neuropsychologist (Dr. Richie or Dr. Gayland). During the interview, the neuropsychologist will speak with you and the individual you brought to the appointment.    The second part of the evaluation is testing with the doctor's technician Neal or Luke). During the testing, the technician will ask you to remember different types of material, solve problems, and answer some questionnaires. Your family member will not be present for this portion of the evaluation.   Please note: We must reserve several hours of the neuropsychologist's time and the psychometrician's time for your evaluation appointment. As such, there is a No-Show fee of $100. If you are unable to attend any of your appointments, please contact our office as soon as possible to reschedule.      DRIVING: Regarding driving, in patients with progressive memory problems, driving will be impaired. We advise to have someone else do the driving if trouble finding directions or if minor accidents are reported. Independent  driving assessment is available to determine safety of driving.   If you are interested in the driving assessment, you can contact the following:  [facility] in [place] [phone]  [facility] [phone]  [facility]  [phone]  [facility] [phone] or [phone]   FALL PRECAUTIONS: Be cautious when walking. Scan the area for obstacles that may increase the risk of trips and falls. When getting up in the mornings, sit up at the edge of the bed for a few minutes before getting out of bed. Consider elevating the bed at the head end to avoid drop of blood pressure when getting up. Walk always in a well-lit room (use night lights in the walls). Avoid area rugs or power cords from appliances in the middle of the walkways. Use a walker or a cane if necessary and consider physical therapy for balance exercise. Get your eyesight checked regularly.  FINANCIAL OVERSIGHT: Supervision, especially oversight when making financial decisions or transactions is also recommended.  HOME SAFETY: Consider the safety of the kitchen when operating appliances like stoves, microwave oven, and blender. Consider having supervision and share cooking responsibilities until no longer able to participate in those. Accidents with firearms and other hazards in the house should be identified and addressed as well.   ABILITY TO BE LEFT ALONE: If patient is unable to contact 911 operator, consider using LifeLine, or when the need is there, arrange for someone to stay with patients. Smoking is a fire hazard, consider supervision or cessation. Risk of wandering should be assessed by caregiver and if detected at any point, supervision and safe proof recommendations should be instituted.  MEDICATION SUPERVISION: Inability to self-administer medication needs to be constantly addressed. Implement a mechanism to ensure safe administration of the medications.      Mediterranean Diet A Mediterranean diet refers to food and lifestyle choices that are based on the traditions of countries located on the [place]. This way of eating has been shown to help prevent certain conditions and improve outcomes for people who have chronic  diseases, like kidney disease and heart disease. What are tips for following this plan? Lifestyle  Cook and eat meals together with your family, when possible. Drink enough fluid to keep your urine clear or pale yellow. Be physically active every day. This includes: Aerobic exercise like running or swimming. Leisure activities like gardening, walking, or housework. Get 7-8 hours of sleep each night. If recommended by your health care provider, drink red wine in moderation. This means 1 glass a day for nonpregnant women and 2 glasses a day for men. A glass of wine equals 5 oz (150 mL). Reading food labels  Check the serving size of packaged foods. For foods such as rice and pasta, the serving size refers to the amount of cooked product, not dry. Check the total fat in packaged foods. Avoid foods that have saturated fat or trans fats. Check the ingredients list for added sugars, such as corn syrup. Shopping  At the grocery store, buy most of your food from the areas near the walls of the store. This includes: Fresh fruits and vegetables (produce). Grains, beans, nuts, and seeds. Some of these may be available in unpackaged forms or large amounts (in bulk). Fresh seafood. Poultry and eggs. Low-fat dairy products. Buy whole ingredients instead of prepackaged foods. Buy fresh fruits and vegetables in-season from local farmers markets. Buy frozen fruits and vegetables in resealable bags. If you do not have access to quality  fresh seafood, buy precooked frozen shrimp or canned fish, such as tuna, salmon, or sardines. Buy small amounts of raw or cooked vegetables, salads, or olives from the deli or salad bar at your store. Stock your pantry so you always have certain foods on hand, such as olive oil, canned tuna, canned tomatoes, rice, pasta, and beans. Cooking  Cook foods with extra-virgin olive oil instead of using butter or other vegetable oils. Have meat as a side dish, and have vegetables  or grains as your main dish. This means having meat in small portions or adding small amounts of meat to foods like pasta or stew. Use beans or vegetables instead of meat in common dishes like chili or lasagna. Experiment with different cooking methods. Try roasting or broiling vegetables instead of steaming or sauteing them. Add frozen vegetables to soups, stews, pasta, or rice. Add nuts or seeds for added healthy fat at each meal. You can add these to yogurt, salads, or vegetable dishes. Marinate fish or vegetables using olive oil, lemon juice, garlic, and fresh herbs. Meal planning  Plan to eat 1 vegetarian meal one day each week. Try to work up to 2 vegetarian meals, if possible. Eat seafood 2 or more times a week. Have healthy snacks readily available, such as: Vegetable sticks with hummus. Greek yogurt. Fruit and nut trail mix. Eat balanced meals throughout the week. This includes: Fruit: 2-3 servings a day Vegetables: 4-5 servings a day Low-fat dairy: 2 servings a day Fish, poultry, or lean meat: 1 serving a day Beans and legumes: 2 or more servings a week Nuts and seeds: 1-2 servings a day Whole grains: 6-8 servings a day Extra-virgin olive oil: 3-4 servings a day Limit red meat and sweets to only a few servings a month What are my food choices? Mediterranean diet Recommended Grains: Whole-grain pasta. Brown rice. Bulgar wheat. Polenta. Couscous. Whole-wheat bread. [place] Made[place]. Vegetables: Artichokes. Beets. Broccoli. Cabbage. Carrots. Eggplant. Green beans. Chard. Kale. Spinach. Onions. Leeks. Peas. Squash. Tomatoes. Peppers. Radishes. Fruits: Apples. Apricots. Avocado. Berries. Bananas. Cherries. Dates. Figs. Grapes. Lemons. Melon. Oranges. Peaches. Plums. Pomegranate. Meats and other protein foods: Beans. Almonds. Sunflower seeds. Pine nuts. Peanuts. Cod. Salmon. Scallops. Shrimp. Tuna. Tilapia. Clams. Oysters. Eggs. Dairy: Low-fat milk. Cheese. Greek yogurt. Beverages:  Water . Red wine. Herbal tea. Fats and oils: Extra virgin olive oil. Avocado oil. Grape seed oil. Sweets and desserts: Greek yogurt with honey. Baked apples. Poached pears. Trail mix. Seasoning and other foods: Basil. Cilantro. Coriander. Cumin. Mint. Parsley. Sage. Rosemary. Tarragon. Garlic. Oregano. Thyme. Pepper. Balsalmic vinegar. Tahini. Hummus. Tomato sauce. Olives. Mushrooms. Limit these Grains: Prepackaged pasta or rice dishes. Prepackaged cereal with added sugar. Vegetables: Deep fried potatoes (french fries). Fruits: Fruit canned in syrup. Meats and other protein foods: Beef. Pork. Lamb. Poultry with skin. Hot dogs. [place]. Dairy: Ice cream. Sour cream. Whole milk. Beverages: Juice. Sugar-sweetened soft drinks. Beer. Liquor and spirits. Fats and oils: Butter. Canola oil. Vegetable oil. Beef fat (tallow). Lard. Sweets and desserts: Cookies. Cakes. Pies. Candy. Seasoning and other foods: Mayonnaise. Premade sauces and marinades. The items listed may not be a complete list. Talk with your dietitian about what dietary choices are right for you. Summary The Mediterranean diet includes both food and lifestyle choices. Eat a variety of fresh fruits and vegetables, beans, nuts, seeds, and whole grains. Limit the amount of red meat and sweets that you eat. Talk with your health care provider about whether it is safe for you to drink red wine  in moderation. This means 1 glass a day for nonpregnant women and 2 glasses a day for men. A glass of wine equals 5 oz (150 mL). This information is not intended to replace advice given to you by your health care provider. Make sure you discuss any questions you have with your health care provider. Document Released: 01/21/2016 Document Revised: 02/23/2016 Document Reviewed: 01/21/2016 Elsevier Interactive Patient Education  2017 [employer].

## 2024-07-11 NOTE — Progress Notes (Signed)
 "   Dementia of unclear etiology, concern for vascular  Scott Wilkins is a very pleasant 84 y.o. 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 MoCA today is 13/30.  Etiology is unclear, suspect vascular causes. Patient is able to participate on ADLs, no longer drives after getting lost more frequently.  Mood is good  . Patient is accompanied by his wife and daughter  who supplement the history.  Follow up in 3 months    Repeat MRI brain to further evaluate for structural abnormalities and vascular load  Follow with ophthalmology for left ischemic optic neuropathy Check B12 TSH For headaches, the patient will be resuming nortriptyline , and medication that has not been taking for a long time after failing to follow-up.  Will refill nortriptyline  10 mg at night, may increase to 25 mg nightly if necessary for comfort   Discussed the use of AI scribe software for clinical note transcription with the patient, who gave verbal consent to proceed.  History of Present Illness  Scott Wilkins is an 84 year old male with prior traumatic brain injury, left MCA stroke, presenting for evaluation of progressive memory impairment and chronic vascular headache.  Memory impairment has progressed over several years, with marked worsening in the past year and further acceleration over the last six months. Short-term memory is severely impaired; he is unable to recall whether he has taken his morning diabetes medication or eaten breakfast, and frequently asks repetitive questions. He requires reminders for daily activities and is no longer able to manage his own medications or  finances, which are now overseen by his wife. Long-term memory remains relatively preserved, as he can recall details from his career and past experiences.  He stopped driving in December after episodes of getting lost while driving and being unable to remember how to contact family for help. He is able to navigate his home and property but becomes disoriented in unfamiliar environments, such as rental houses, where he cannot remember which bedroom is his and repeatedly asks about planned activities. There have been no episodes of wandering off on foot requiring search by family, but there is concern about potential for getting lost if he leaves the property  He frequently misplaces items such as his glucose sensor, wallet, and hearing aids, often hiding them and then forgetting their location. This behavior has worsened over the past six months. He does not consistently wear his hearing aids and has lost them while mowing the lawn.  Mood changes are limited to frustration, particularly during interactions with his wife when repetitive questioning leads to arguments. There is no history of depression, anxiety, hallucinations, or psychosis. Sleep is described as good, with no nightmares, vivid dreams, or abnormal movements during sleep. He does not require reminders for personal hygiene or dressing and does not cook, thus no kitchen accidents have occurred. Appetite is good, though he does not drink much water . No difficulty swallowing.  He has chronic left-sided headaches with left eye pressure radiating to the temple and occiput, without associated nausea, photophobia, or phonophobia. Headaches can be severe. He was previously prescribed nortriptyline  10 mg nightly in March 2024, with instructions to increase to 25 mg nightly if  needed, but it is unclear if this was ever initiated. He has not taken other headache-specific medications except for occasional acetaminophen  and a single dose of Nurtec provided  by his daughter, which was helpful.  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 No recent seizures.  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  He has left visual field loss and is followed by an eye doctor. He has a decreased sense of smell, which is chronic and not new. 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  Denies any urine incontinence.  He previously took donepezil  5 mg daily from 2022 to 2024, prescribed by Dr. Rollene, but this was discontinued by his wife daughter suspects that after reading the side effects his wife did not want him to have it. 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    MRI of the brain 10/03/2022 personally reviewed remarkable for unchanged encephalomalacia in the left parietal lobe, superior temporal lobe and left frontal operculum from prior left MCA territory infarct, mild cerebral and hippocampal atrophy   Allergies[1]  Current Outpatient Medications  Medication Instructions   Accu-Chek Softclix Lancets lancets 1 each, Other, 2 times daily, E11.9   acetaminophen  (TYLENOL ) 1,000-1,500 mg, Every 8 hours PRN   albuterol  (VENTOLIN  HFA) 108 (90 Base) MCG/ACT inhaler 1-2 puffs, Inhalation, Every 6 hours PRN   amoxicillin -clavulanate (AUGMENTIN ) 875-125 MG tablet 1 tablet, Oral, Every 12 hours   aspirin  EC 81 MG tablet Take by mouth.   aspirin  81 mg, Oral, Daily   baclofen  (LIORESAL ) 10 mg, Oral, 3 times daily   benzonatate  (TESSALON )  100 mg, Oral, Every 8 hours PRN   Blood Glucose Calibration (ACCU-CHEK AVIVA) SOLN 1 each, Other, As needed, E11.9   Blood Glucose Monitoring Suppl (TRUE METRIX METER) w/Device KIT 1 each, Does not apply, 2 times daily   carvedilol  (COREG ) 3.125 MG tablet TAKE 1 TABLET TWICE DAILY WITH MEALS. (PLEASE SCHEDULE AN APPOINTMENT FOR FUTURE REFILLS.)   donepezil  (ARICEPT ) 10 MG tablet Take half tablet (5 mg) daily for 2 weeks, then increase to the full tablet at 10 mg daily   gabapentin (NEURONTIN) 100 MG capsule Take by mouth.   glucose blood (TRUE METRIX PRO BLOOD GLUCOSE) test strip Use as instructed   insulin  NPH-regular Human (70-30) 100 UNIT/ML injection 20 Units, Subcutaneous, Daily with breakfast   lisinopril  (ZESTRIL ) 10 mg, Oral, Daily   loperamide (IMODIUM A-D) 4 mg, 4 times daily PRN   nitroGLYCERIN  (NITROSTAT ) 0.4 mg, Sublingual, Every 5 min x3 PRN   nortriptyline  (PAMELOR ) 10 mg, Oral, Daily at bedtime   pantoprazole  (PROTONIX ) 40 mg, Oral, Daily   rosuvastatin  (CRESTOR ) 5 mg, Oral, Daily   Simethicone  (GAS-X PO) 1 tablet, As needed   Trulicity  3 mg, Injection, Weekly    VITALS:   Vitals:   07/11/24 0941  BP: 127/70  Pulse: 76  Resp: 20  SpO2: 98%  Weight: 161 lb (73 kg)  Height: 5' 6 (1.676 m)     Neurological Exam     07/11/2024  12:00 PM  Montreal Cognitive Assessment   Visuospatial/ Executive (0/5) 3  Naming (0/3) 2  Attention: Read list of digits (0/2) 1  Attention: Read list of letters (0/1) 1  Attention: Serial 7 subtraction starting at 100 (0/3) 1  Language: Repeat phrase (0/2) 0  Language : Fluency (0/1) 0  Abstraction (0/2) 1  Delayed Recall (0/5) 0  Orientation (0/6) 3  Total 12  Adjusted Score (based on education) 13        No data to display            Orientation:  Alert and oriented to person, place and not to time. No aphasia or dysarthria. Fund of knowledge is reduced. Recent and remote memory impaired.  Attention and concentration are  reduced .  Able to name objects and unable to repeat phrases.  Delayed recall  0/5 .  Cranial nerves: There is good facial symmetry.  Fundi not visualized extraocular muscles are intact and visual fields are full to confrontational testing. Speech is fluent and clear. No tongue deviation. Hearing is intact to conversational tone.  Tone: Tone is good throughout. Sensation: Sensation is intact to light touch.  Vibration is intact at the bilateral big toe.  Coordination: The patient has no difficulty with RAM's or FNF bilaterally. Normal finger to nose  Motor: Strength is 5/5 in the bilateral upper and lower extremities. There is no pronator drift. There are no fasciculations noted.  Very mild intention tremor, no resting tremor DTR's: Deep tendon reflexes are 2/4 bilaterally. Gait and Station: The patient is able to ambulate without difficulty. Gait is cautious and narrow. Stride length is normal.        Thank you for allowing us  the opportunity to participate in the care of this nice patient. Please do not hesitate to contact us  for any questions or concerns.   Total time spent on today's visit was 65 minutes dedicated to this patient today, preparing to see patient, examining the patient, ordering tests and/or medications and counseling the patient, documenting clinical information in the EHR or other health record, independently interpreting results and communicating results to the patient/family, discussing treatment and goals, answering patient's questions and coordinating care.  Cc:  Rollene Almarie LABOR, MD  Camie Sevin 07/11/2024 12:07 PM       [1]  Allergies Allergen Reactions   Metformin  And Related     Abdominal pain, diarrhea   Ozempic  (0.25 Or 0.5 Mg-Dose) [Semaglutide (0.25 Or 0.5mg -Dos)]     Nausea, headache,blurred vision   "

## 2024-07-12 ENCOUNTER — Ambulatory Visit: Payer: Self-pay | Admitting: Physician Assistant

## 2024-07-12 LAB — TSH: TSH: 1.2 m[IU]/L (ref 0.40–4.50)

## 2024-07-12 LAB — VITAMIN B12: Vitamin B-12: 366 pg/mL (ref 200–1100)

## 2024-07-27 ENCOUNTER — Other Ambulatory Visit

## 2024-10-02 ENCOUNTER — Ambulatory Visit: Payer: Self-pay | Admitting: Physician Assistant
